# Patient Record
Sex: Male | Born: 1940
Health system: Southern US, Community
[De-identification: ages and names within clinical notes are randomized; demographics above are authoritative.]

## PROBLEM LIST (undated history)

## (undated) DIAGNOSIS — F1121 Opioid dependence, in remission: Secondary | ICD-10-CM

## (undated) DIAGNOSIS — I255 Ischemic cardiomyopathy: Secondary | ICD-10-CM

## (undated) DIAGNOSIS — I350 Nonrheumatic aortic (valve) stenosis: Secondary | ICD-10-CM

## (undated) DIAGNOSIS — I2510 Atherosclerotic heart disease of native coronary artery without angina pectoris: Secondary | ICD-10-CM

## (undated) DIAGNOSIS — N183 Chronic kidney disease, stage 3 unspecified: Secondary | ICD-10-CM

## (undated) DIAGNOSIS — D509 Iron deficiency anemia, unspecified: Secondary | ICD-10-CM

## (undated) DIAGNOSIS — Z720 Tobacco use: Secondary | ICD-10-CM

## (undated) DIAGNOSIS — H348192 Central retinal vein occlusion, unspecified eye, stable: Secondary | ICD-10-CM

## (undated) DIAGNOSIS — I5020 Unspecified systolic (congestive) heart failure: Secondary | ICD-10-CM

## (undated) DIAGNOSIS — I340 Nonrheumatic mitral (valve) insufficiency: Secondary | ICD-10-CM

## (undated) DIAGNOSIS — I10 Essential (primary) hypertension: Secondary | ICD-10-CM

## (undated) DIAGNOSIS — M1990 Unspecified osteoarthritis, unspecified site: Secondary | ICD-10-CM

## (undated) HISTORY — PX: EYE SURGERY: SHX253

## (undated) HISTORY — DX: Unspecified systolic (congestive) heart failure: I50.20

## (undated) HISTORY — DX: Central retinal vein occlusion, unspecified eye, stable: H34.8192

## (undated) HISTORY — DX: Chronic kidney disease, stage 3 unspecified: N18.30

## (undated) HISTORY — DX: Essential (primary) hypertension: I10

## (undated) HISTORY — PX: OTHER SURGICAL HISTORY: SHX169

## (undated) HISTORY — DX: Unspecified osteoarthritis, unspecified site: M19.90

## (undated) HISTORY — DX: Iron deficiency anemia, unspecified: D50.9

## (undated) HISTORY — DX: Tobacco use: Z72.0

## (undated) HISTORY — DX: Opioid dependence, in remission: F11.21

## (undated) HISTORY — DX: Nonrheumatic mitral (valve) insufficiency: I34.0

## (undated) HISTORY — DX: Chronic kidney disease, stage 3 (moderate): N18.3

---

## 1966-03-14 HISTORY — PX: OTHER SURGICAL HISTORY: SHX169

## 1992-03-14 HISTORY — PX: OTHER SURGICAL HISTORY: SHX169

## 2011-04-27 ENCOUNTER — Ambulatory Visit (INDEPENDENT_AMBULATORY_CARE_PROVIDER_SITE_OTHER): Payer: Self-pay | Admitting: Family Medicine

## 2011-04-27 ENCOUNTER — Encounter: Payer: Self-pay | Admitting: Family Medicine

## 2011-04-27 VITALS — BP 150/90 | HR 80 | Temp 98.1°F | Ht 70.5 in | Wt 158.8 lb

## 2011-04-27 DIAGNOSIS — I10 Essential (primary) hypertension: Secondary | ICD-10-CM | POA: Insufficient documentation

## 2011-04-27 MED ORDER — LISINOPRIL 10 MG PO TABS: 10.0000 mg | ORAL_TABLET | Freq: Every day | ORAL | Status: DC

## 2011-04-27 NOTE — Progress Notes (Signed)
Subjective:    Patient ID: George Washington, male    DOB: 1941-01-10, 71 y.o.   MRN: [id number]  HPI  71 yo male here to establish care.  Elevated blood pressure- has been treated for retinal vein occlusion and told BP has been slowly creeping up, no higher than 150s/90s.  No CP, SOB, or LE. He is a non smoker. Brother has h/o HTN and CAD s/p stents and CABG.  He has not had lipids or other lab work checked but currently does not have insurance.  Very active, in overall good health.  Patient Active Problem List  Diagnoses  . Elevated blood pressure   Past Medical History  Diagnosis Date  . Narcotic dependence, in remission   . Elevated blood pressure   . Arthritis   . Retinal vein occlusion    Past Surgical History  Procedure Date  . Vro   . Eye surgery    History  Substance Use Topics  . Smoking status: Not on file  . Smokeless tobacco: Not on file  . Alcohol Use: Not on file   Family History  Problem Relation Age of Onset  . Hyperlipidemia Father   . Hypertension Father   . Heart disease Sister 50    MI, CABG   No Known Allergies  Current outpatient prescriptions:aspirin-acetaminophen-caffeine (EXCEDRIN MIGRAINE) 250-250-65 MG per tablet, Take 1 tablet by mouth every 6 (six) hours as needed., Disp: , Rfl: ;  fish oil-omega-3 fatty acids 1000 MG capsule, Take 1 g by mouth daily., Disp: , Rfl: ;  meloxicam (MOBIC) 15 MG tablet, Take 15 mg by mouth 2 (two) times daily., Disp: , Rfl: ;  Multiple Vitamins-Minerals (CENTRUM SILVER PO), Take 1 tablet by mouth daily., Disp: , Rfl:  vitamin E 400 UNIT capsule, Take 400 Units by mouth daily., Disp: , Rfl: ;  lisinopril (PRINIVIL,ZESTRIL) 10 MG tablet, Take 1 tablet (10 mg total) by mouth daily., Disp: 30 tablet, Rfl: 6  The PMH, PSH, Social History, Family History, Medications, and allergies have been reviewed in [facility], and have been updated if relevant.    Review of Systems See HPI Patient reports no  vision/ hearing  changes,anorexia, weight change, fever ,adenopathy, persistant / recurrent hoarseness, swallowing issues, chest pain, edema,persistant / recurrent cough, hemoptysis, dyspnea(rest, exertional, paroxysmal nocturnal), gastrointestinal  bleeding (melena, rectal bleeding), abdominal pain, excessive heart burn, GU symptoms(dysuria, hematuria, pyuria, voiding/incontinence  Issues) syncope, focal weakness, severe memory loss, concerning skin lesions, depression, anxiety, abnormal bruising/bleeding, major joint swelling.       Objective:   Physical Exam BP 150/90  Pulse 80  Temp(Src) 98.1 F (36.7 C) (Oral)  Ht 5' 10.5" (1.791 m)  Wt 158 lb 12 oz (72.009 kg)  BMI 22.46 kg/m2 General:  overweght male in NAD Eyes:  PERRL Ears:  External ear exam shows no significant lesions or deformities.  Otoscopic examination reveals clear canals, tympanic membranes are intact bilaterally without bulging, retraction, inflammation or discharge. Hearing is grossly normal bilaterally. Nose:  External nasal examination shows no deformity or inflammation. Nasal mucosa are pink and moist without lesions or exudates. Mouth:  Oral mucosa and oropharynx without lesions or exudates.  Teeth in good repair. Neck:  no carotid bruit or thyromegaly no cervical or supraclavicular lymphadenopathy  Lungs:  Normal respiratory effort, chest expands symmetrically. Lungs are clear to auscultation, no crackles or wheezes. Heart:  Normal rate and regular rhythm. S1 and S2 normal without gallop, murmur, click, rub or other extra sounds, faint bilateral carotid bruits.  Abdomen:  Bowel sounds positive,abdomen soft and non-tender without masses, organomegaly or hernias noted. Pulses:  R and L posterior tibial pulses are full and equal bilaterally  Extremities:  no edema     Assessment & Plan:   1. Hypertension   New. Will start Lisinopril 10 mg daily today- see pt instructions for details. Would like to check labs- BMET, lipid panel and  carotid dopplers.  Pt currently refusing due to lack of insurance.  Will defer until next month.

## 2011-04-27 NOTE — Patient Instructions (Signed)
It was great to meet you, George Washington. Please call me in 2-3 weeks with your blood pressure readings. As soon as your insurance paperwork goes through, please make another appointment so we can get some labwork and other studies discussed today. Say hi to [facility] for me.

## 2011-05-10 ENCOUNTER — Telehealth: Payer: Self-pay | Admitting: Family Medicine

## 2011-05-10 MED ORDER — LISINOPRIL-HYDROCHLOROTHIAZIDE 20-12.5 MG PO TABS: 1.0000 | ORAL_TABLET | Freq: Every day | ORAL | Status: DC

## 2011-05-10 NOTE — Telephone Encounter (Signed)
George Washington, patient wife called. Says pt is having problems w/ his blood pressure.  Reading are 160/90; 170.98; 170/100. Says the medication is not helping. Wife would like a call back at  [phone].Marland Kitchen

## 2011-05-10 NOTE — Telephone Encounter (Signed)
Spoke with his wife, Dondra Spry who is an Charity fundraiser at [employer]. Pt has already been doubling his dose so he has been taking lisinopril 20 mg daily. Will add HCTZ 12.5 mg daily with combo pill. Advised to STOP current lisinopril. Dondra Spry indicates understanding of these issues and agrees with the plan.

## 2011-09-12 DIAGNOSIS — H35359 Cystoid macular degeneration, unspecified eye: Secondary | ICD-10-CM | POA: Diagnosis not present

## 2011-09-12 DIAGNOSIS — H348192 Central retinal vein occlusion, unspecified eye, stable: Secondary | ICD-10-CM | POA: Diagnosis not present

## 2011-10-25 DIAGNOSIS — H348192 Central retinal vein occlusion, unspecified eye, stable: Secondary | ICD-10-CM | POA: Diagnosis not present

## 2011-10-25 DIAGNOSIS — H35359 Cystoid macular degeneration, unspecified eye: Secondary | ICD-10-CM | POA: Diagnosis not present

## 2011-11-16 ENCOUNTER — Encounter: Payer: Self-pay | Admitting: Family Medicine

## 2011-11-16 ENCOUNTER — Encounter: Payer: Self-pay | Admitting: Gastroenterology

## 2011-11-16 ENCOUNTER — Ambulatory Visit (INDEPENDENT_AMBULATORY_CARE_PROVIDER_SITE_OTHER): Payer: Medicare Other | Admitting: Family Medicine

## 2011-11-16 ENCOUNTER — Other Ambulatory Visit: Payer: Self-pay | Admitting: Family Medicine

## 2011-11-16 VITALS — BP 142/70 | HR 74 | Temp 97.9°F | Ht 68.0 in | Wt 148.0 lb

## 2011-11-16 DIAGNOSIS — I10 Essential (primary) hypertension: Secondary | ICD-10-CM | POA: Diagnosis not present

## 2011-11-16 DIAGNOSIS — Z0000 Encounter for general adult medical examination without abnormal findings: Secondary | ICD-10-CM

## 2011-11-16 DIAGNOSIS — Z136 Encounter for screening for cardiovascular disorders: Secondary | ICD-10-CM

## 2011-11-16 DIAGNOSIS — Z125 Encounter for screening for malignant neoplasm of prostate: Secondary | ICD-10-CM | POA: Diagnosis not present

## 2011-11-16 DIAGNOSIS — Z720 Tobacco use: Secondary | ICD-10-CM

## 2011-11-16 DIAGNOSIS — Z23 Encounter for immunization: Secondary | ICD-10-CM

## 2011-11-16 DIAGNOSIS — N179 Acute kidney failure, unspecified: Secondary | ICD-10-CM

## 2011-11-16 DIAGNOSIS — Z1211 Encounter for screening for malignant neoplasm of colon: Secondary | ICD-10-CM

## 2011-11-16 LAB — COMPREHENSIVE METABOLIC PANEL
ALT: 12 U/L (ref 0–53)
CO2: 26 mEq/L (ref 19–32)
Calcium: 9.6 mg/dL (ref 8.4–10.5)
Chloride: 103 mEq/L (ref 96–112)
GFR: 43.05 mL/min — ABNORMAL LOW (ref >60.00)
Sodium: 135 mEq/L (ref 135–145)
Total Protein: 6.3 g/dL (ref 6.0–8.3)

## 2011-11-16 LAB — PSA, MEDICARE: PSA: 1.16 ng/ml (ref 0.10–4.00)

## 2011-11-16 LAB — LIPID PANEL: VLDL: 11 mg/dL (ref 0.0–40.0)

## 2011-11-16 MED ORDER — HYDROCHLOROTHIAZIDE 25 MG PO TABS: 12.5000 mg | ORAL_TABLET | Freq: Every day | ORAL | Status: DC

## 2011-11-16 MED ORDER — ZOSTER VACCINE LIVE 19400 UNT/0.65ML ~~LOC~~ SOLR: 0.6500 mL | Freq: Once | SUBCUTANEOUS | Status: AC

## 2011-11-16 NOTE — Patient Instructions (Addendum)
Great to see you, Mr. George Washington. We are STOPPING your lisinopril/HCTZ and start HCTZ 12.5 mg daily - ok to increase to a whole tablet (25 mg daily) if your blood pressure goes up. Please call me in few weeks with an update.  Check with your insurance to see if they will cover the shingles shot. Please stop by to see Shirlee Limerick on your way out to set up your colonoscopy.

## 2011-11-16 NOTE — Progress Notes (Signed)
Subjective:    Patient ID: George Washington, male    DOB: May 23, 1940, 71 y.o.   MRN: [id number]  HPI  71 yo male here for Welcome to Medicare Physical.  I have personally reviewed the Medicare Annual Wellness questionnaire and have noted 1. The patient's medical and social history 2. Their use of alcohol, tobacco or illicit drugs 3. Their current medications and supplements 4. The patient's functional ability including ADL's, fall risks, home safety risks and hearing or visual             impairment. 5. Diet and physical activities 6. Evidence for depression or mood disorders  HTN- has been treated for retinal vein occlusion and told BP has been slowly creeping up, no higher than 150s/90s.  No CP, SOB, or LE. He is a non smoker. Brother has h/o HTN and CAD s/p stents and CABG. We started Lisinopril 10 mg/ HCTZ 12.5 mg daily at his initial office visit in 04/2011- lost to follow up.  He did not have insurance and was waiting until he started medicare.  Very active, in overall good health.  Has lost 10 pounds since February- exercising more.  Has noted a dry cough when questioned.  No SOB.  Has not had a colonoscopy in years. He is a former/sometimes current smoker- never screened for AAA.    Patient Active Problem List  Diagnosis  . Hypertension  . Routine general medical examination at a health care facility   Past Medical History  Diagnosis Date  . Narcotic dependence, in remission   . Elevated blood pressure   . Arthritis   . Retinal vein occlusion    Past Surgical History  Procedure Date  . Vro   . Eye surgery    History  Substance Use Topics  . Smoking status: Not on file  . Smokeless tobacco: Not on file  . Alcohol Use: Not on file   Family History  Problem Relation Age of Onset  . Hyperlipidemia Father   . Hypertension Father   . Heart disease Sister 56    MI, CABG   No Known Allergies  Current outpatient prescriptions:aspirin-acetaminophen-caffeine  (EXCEDRIN MIGRAINE) 250-250-65 MG per tablet, Take 1 tablet by mouth every 6 (six) hours as needed., Disp: , Rfl: ;  fish oil-omega-3 fatty acids 1000 MG capsule, Take 1 g by mouth daily., Disp: , Rfl: ;  lisinopril (PRINIVIL,ZESTRIL) 10 MG tablet, Take 1 tablet (10 mg total) by mouth daily., Disp: 30 tablet, Rfl: 6 lisinopril-hydrochlorothiazide (PRINZIDE,ZESTORETIC) 20-12.5 MG per tablet, Take 1 tablet by mouth daily., Disp: 30 tablet, Rfl: 1;  meloxicam (MOBIC) 15 MG tablet, Take 15 mg by mouth 2 (two) times daily., Disp: , Rfl: ;  Multiple Vitamins-Minerals (CENTRUM SILVER PO), Take 1 tablet by mouth daily., Disp: , Rfl: ;  vitamin E 400 UNIT capsule, Take 400 Units by mouth daily., Disp: , Rfl:   The PMH, PSH, Social History, Family History, Medications, and allergies have been reviewed in [facility], and have been updated if relevant.    Review of Systems See HPI Patient reports no  vision/ hearing changes,anorexia, weight change, fever ,adenopathy, persistant / recurrent hoarseness, swallowing issues, chest pain, edema,persistant / recurrent cough, hemoptysis, dyspnea(rest, exertional, paroxysmal nocturnal), gastrointestinal  bleeding (melena, rectal bleeding), abdominal pain, excessive heart burn, GU symptoms(dysuria, hematuria, pyuria, voiding/incontinence  Issues) syncope, focal weakness, severe memory loss, concerning skin lesions, depression, anxiety, abnormal bruising/bleeding, major joint swelling.       Objective:   Physical Exam BP 142/70  Pulse 74  Temp 97.9 F (36.6 C)  Ht 5\' 8"  (1.727 m)  Wt 148 lb (67.132 kg)  BMI 22.50 kg/m2 Wt Readings from Last 3 Encounters:  11/16/11 148 lb (67.132 kg)  04/27/11 158 lb 12 oz (72.009 kg)     General:  pleasant male in NAD Eyes:  PERRL Ears:  External ear exam shows no significant lesions or deformities.  Otoscopic examination reveals clear canals, tympanic membranes are intact bilaterally without bulging, retraction, inflammation or  discharge. Hearing is grossly normal bilaterally. Nose:  External nasal examination shows no deformity or inflammation. Nasal mucosa are pink and moist without lesions or exudates. Mouth:  Oral mucosa and oropharynx without lesions or exudates.  Teeth in good repair. Neck:  no carotid bruit or thyromegaly no cervical or supraclavicular lymphadenopathy  Lungs:  Normal respiratory effort, chest expands symmetrically. Lungs are clear to auscultation, no crackles or wheezes. Heart:  Normal rate and regular rhythm. S1 and S2 normal without gallop, murmur, click, rub or other extra sounds, faint bilateral carotid bruits. Abdomen:  Bowel sounds positive,abdomen soft and non-tender without masses, organomegaly or hernias noted. Pulses:  R and L posterior tibial pulses are full and equal bilaterally  Extremities:  no edema     Assessment & Plan:   1. Hypertension  Stable but has a dry cough- probable ACEI allergy- added to allergy list. D/c lisinopril. Start HCTZ 12.5 mg daily only (has lost 10 pounds and likely does not need as high of a dose). The patient indicates understanding of these issues and agrees with the plan.   2. Routine general medical examination at a health care facility  The patients weight, height, BMI and visual acuity have been recorded in the chart I have made referrals, counseling and provided education to the patient based review of the above and I have provided the pt with a written personalized care plan for preventive services. Orders Placed This Encounter  Procedures  . Comprehensive metabolic panel  . Lipid Panel  . PSA, Medicare  . Ambulatory referral to Gastroenterology  . EKG 12-Lead  . Abdominal Aortic Aneurysm duplex   Given Rx for zostavax. Pneumovax given today.

## 2011-11-16 NOTE — Addendum Note (Signed)
Addended by: Eliezer Bottom on: 11/16/2011 03:20 PM   Modules accepted: Orders

## 2011-11-18 ENCOUNTER — Telehealth: Payer: Self-pay

## 2011-11-18 MED ORDER — HYDROCHLOROTHIAZIDE 12.5 MG PO CAPS: 12.5000 mg | ORAL_CAPSULE | Freq: Every day | ORAL | Status: DC

## 2011-11-18 NOTE — Telephone Encounter (Signed)
Pt request HCTZ capsule 12.5 mg sent to [facility]; does not want to cut 25 mg pill in half.Please advise.

## 2011-11-18 NOTE — Telephone Encounter (Signed)
Ok to send as requested

## 2011-11-24 ENCOUNTER — Other Ambulatory Visit (INDEPENDENT_AMBULATORY_CARE_PROVIDER_SITE_OTHER): Payer: Medicare Other

## 2011-11-24 DIAGNOSIS — N179 Acute kidney failure, unspecified: Secondary | ICD-10-CM | POA: Diagnosis not present

## 2011-11-24 LAB — RENAL FUNCTION PANEL
Albumin: 3.8 g/dL (ref 3.5–5.2)
CO2: 24 mEq/L (ref 19–32)
Calcium: 9.4 mg/dL (ref 8.4–10.5)
Chloride: 104 mEq/L (ref 96–112)
Potassium: 4.4 mEq/L (ref 3.5–5.1)
Sodium: 137 mEq/L (ref 135–145)

## 2011-11-25 ENCOUNTER — Other Ambulatory Visit: Payer: Self-pay | Admitting: Family Medicine

## 2011-11-25 DIAGNOSIS — N179 Acute kidney failure, unspecified: Secondary | ICD-10-CM

## 2011-11-29 ENCOUNTER — Encounter (INDEPENDENT_AMBULATORY_CARE_PROVIDER_SITE_OTHER): Payer: Medicare Other

## 2011-11-29 DIAGNOSIS — Z0000 Encounter for general adult medical examination without abnormal findings: Secondary | ICD-10-CM | POA: Diagnosis not present

## 2011-11-29 DIAGNOSIS — Z720 Tobacco use: Secondary | ICD-10-CM

## 2011-11-29 DIAGNOSIS — Z136 Encounter for screening for cardiovascular disorders: Secondary | ICD-10-CM

## 2011-11-29 DIAGNOSIS — Z23 Encounter for immunization: Secondary | ICD-10-CM | POA: Diagnosis not present

## 2011-12-05 ENCOUNTER — Other Ambulatory Visit: Payer: Self-pay | Admitting: Family Medicine

## 2011-12-05 DIAGNOSIS — I77819 Aortic ectasia, unspecified site: Secondary | ICD-10-CM

## 2011-12-06 ENCOUNTER — Telehealth: Payer: Self-pay | Admitting: Family Medicine

## 2011-12-06 NOTE — Telephone Encounter (Signed)
[facility] has placed a recall for FU Abdominal Aortic Aneurysm Duplex in 2 years. Letter will be sent to the patient to call  to schedule this study at [facility] in [place]. Patient has been notified.

## 2011-12-20 DIAGNOSIS — H35359 Cystoid macular degeneration, unspecified eye: Secondary | ICD-10-CM | POA: Diagnosis not present

## 2011-12-20 DIAGNOSIS — H348192 Central retinal vein occlusion, unspecified eye, stable: Secondary | ICD-10-CM | POA: Diagnosis not present

## 2011-12-23 ENCOUNTER — Other Ambulatory Visit (INDEPENDENT_AMBULATORY_CARE_PROVIDER_SITE_OTHER): Payer: Medicare Other

## 2011-12-23 DIAGNOSIS — N179 Acute kidney failure, unspecified: Secondary | ICD-10-CM

## 2011-12-23 LAB — BASIC METABOLIC PANEL
BUN: 22 mg/dL (ref 6–23)
Chloride: 103 mEq/L (ref 96–112)
Glucose, Bld: 99 mg/dL (ref 70–99)
Potassium: 4.6 mEq/L (ref 3.5–5.1)
Sodium: 138 mEq/L (ref 135–145)

## 2011-12-26 ENCOUNTER — Ambulatory Visit (AMBULATORY_SURGERY_CENTER): Payer: Medicare Other | Admitting: *Deleted

## 2011-12-26 ENCOUNTER — Other Ambulatory Visit: Payer: Self-pay | Admitting: Family Medicine

## 2011-12-26 VITALS — Ht 70.0 in | Wt 156.8 lb

## 2011-12-26 DIAGNOSIS — N289 Disorder of kidney and ureter, unspecified: Secondary | ICD-10-CM

## 2011-12-26 DIAGNOSIS — Z1211 Encounter for screening for malignant neoplasm of colon: Secondary | ICD-10-CM

## 2011-12-26 MED ORDER — MOVIPREP 100 G PO SOLR: ORAL | Status: DC

## 2012-01-09 ENCOUNTER — Ambulatory Visit (AMBULATORY_SURGERY_CENTER): Payer: Medicare Other | Admitting: Gastroenterology

## 2012-01-09 ENCOUNTER — Encounter: Payer: Self-pay | Admitting: Gastroenterology

## 2012-01-09 VITALS — BP 126/66 | HR 71 | Temp 96.6°F | Resp 16 | Ht 70.0 in | Wt 156.0 lb

## 2012-01-09 DIAGNOSIS — Z1211 Encounter for screening for malignant neoplasm of colon: Secondary | ICD-10-CM

## 2012-01-09 MED ORDER — SODIUM CHLORIDE 0.9 % IV SOLN: 500.0000 mL | INTRAVENOUS | Status: DC

## 2012-01-09 NOTE — Progress Notes (Signed)
Patient did not experience any of the following events: a burn prior to discharge; a fall within the facility; wrong site/side/patient/procedure/implant event; or a hospital transfer or hospital admission upon discharge from the facility. (G8907) Patient did not have preoperative order for IV antibiotic SSI prophylaxis. (G8918)  

## 2012-01-09 NOTE — Op Note (Signed)
 [facility] [address] [place], [address]   COLONOSCOPY PROCEDURE REPORT  PATIENT: George Washington, George Washington  MR#: [id number] BIRTHDATE: Mar 21, 1940 , 70  yrs. old GENDER: Male ENDOSCOPIST: Rachael Fee, MD REFERRED HQ:IONGE Aron, M.D. PROCEDURE DATE:  01/09/2012 PROCEDURE:   Colonoscopy, diagnostic ASA CLASS:   Class III INDICATIONS:average risk screening. MEDICATIONS: Fentanyl 100 mcg IV, Versed 10 mg IV, and These medications were titrated to patient response per physician's verbal order  DESCRIPTION OF PROCEDURE:   After the risks benefits and alternatives of the procedure were thoroughly explained, informed consent was obtained.  A digital rectal exam revealed no abnormalities of the rectum.   The LB [id number]  endoscope was introduced through the anus and advanced to the cecum, which was identified by both the appendix and ileocecal valve. No adverse events experienced.   The quality of the prep was good, using MoviPrep  The instrument was then slowly withdrawn as the colon was fully examined.    COLON FINDINGS: A normal appearing cecum, ileocecal valve, and appendiceal orifice were identified.  The ascending, hepatic flexure, transverse, splenic flexure, descending, sigmoid colon and rectum appeared unremarkable.  No polyps or cancers were seen. Retroflexed views revealed no abnormalities. The time to cecum=5 minutes 10 seconds.  Withdrawal time=10 minutes 18 seconds.  The scope was withdrawn and the procedure completed. COMPLICATIONS: There were no complications.  ENDOSCOPIC IMPRESSION: Normal colon No cancers or polyps  RECOMMENDATIONS: You should continue to follow colorectal cancer screening guidelines for "routine risk" patients with a repeat colonoscopy in 10 years. There is no need for FOBT (stool) testing for at least 5 years.   eSigned:  Rachael Fee, MD 01/09/2012 9:21 AM

## 2012-01-09 NOTE — Patient Instructions (Addendum)
One of your biggest health concerns is your smoking.  This increases your risk for most cancers and serious cardiovascular diseases such as strokes, heart attacks.  You should try your best to stop.  If you need assistance, please contact your PCP or Smoking Cessation Class at [facility] ([phone]) or [place] [facility] (1-800-QUIT-NOW).  Discharge instructions given with verbal understanding. Normal exam. Resume previous medications. YOU HAD AN ENDOSCOPIC PROCEDURE TODAY AT THE [facility]: Refer to the procedure report that was given to you for any specific questions about what was found during the examination.  If the procedure report does not answer your questions, please call your gastroenterologist to clarify.  If you requested that your care partner not be given the details of your procedure findings, then the procedure report has been included in a sealed envelope for you to review at your convenience later.  YOU SHOULD EXPECT: Some feelings of bloating in the abdomen. Passage of more gas than usual.  Walking can help get rid of the air that was put into your GI tract during the procedure and reduce the bloating. If you had a lower endoscopy (such as a colonoscopy or flexible sigmoidoscopy) you may notice spotting of blood in your stool or on the toilet paper. If you underwent a bowel prep for your procedure, then you may not have a normal bowel movement for a few days.  DIET: Your first meal following the procedure should be a light meal and then it is ok to progress to your normal diet.  A half-sandwich or bowl of soup is an example of a good first meal.  Heavy or fried foods are harder to digest and may make you feel nauseous or bloated.  Likewise meals heavy in dairy and vegetables can cause extra gas to form and this can also increase the bloating.  Drink plenty of fluids but you should avoid alcoholic beverages for 24 hours.  ACTIVITY: Your care partner should take you  home directly after the procedure.  You should plan to take it easy, moving slowly for the rest of the day.  You can resume normal activity the day after the procedure however you should NOT DRIVE or use heavy machinery for 24 hours (because of the sedation medicines used during the test).    SYMPTOMS TO REPORT IMMEDIATELY: A gastroenterologist can be reached at any hour.  During normal business hours, 8:30 AM to 5:00 PM Monday through Friday, call [phone].  After hours and on weekends, please call the GI answering service at [phone] who will take a message and have the physician on call contact you.   Following lower endoscopy (colonoscopy or flexible sigmoidoscopy):  Excessive amounts of blood in the stool  Significant tenderness or worsening of abdominal pains  Swelling of the abdomen that is new, acute  Fever of 100F or higher  FOLLOW UP: If any biopsies were taken you will be contacted by phone or by letter within the next 1-3 weeks.  Call your gastroenterologist if you have not heard about the biopsies in 3 weeks.  Our staff will call the home number listed on your records the next business day following your procedure to check on you and address any questions or concerns that you may have at that time regarding the information given to you following your procedure. This is a courtesy call and so if there is no answer at the home number and we have not heard from you through the emergency   physician on call, we will assume that you have returned to your regular daily activities without incident.  SIGNATURES/CONFIDENTIALITY: You and/or your care partner have signed paperwork which will be entered into your electronic medical record.  These signatures attest to the fact that that the information above on your After Visit Summary has been reviewed and is understood.  Full responsibility of the confidentiality of this discharge information lies with you and/or your care-partner. 

## 2012-01-10 ENCOUNTER — Telehealth: Payer: Self-pay | Admitting: *Deleted

## 2012-01-10 NOTE — Telephone Encounter (Signed)
No answer, left message to call office for questions or concerns. 

## 2012-01-19 DIAGNOSIS — I10 Essential (primary) hypertension: Secondary | ICD-10-CM | POA: Diagnosis not present

## 2012-01-19 DIAGNOSIS — R809 Proteinuria, unspecified: Secondary | ICD-10-CM | POA: Diagnosis not present

## 2012-01-19 DIAGNOSIS — N2581 Secondary hyperparathyroidism of renal origin: Secondary | ICD-10-CM | POA: Diagnosis not present

## 2012-01-19 DIAGNOSIS — R609 Edema, unspecified: Secondary | ICD-10-CM | POA: Diagnosis not present

## 2012-02-20 DIAGNOSIS — R809 Proteinuria, unspecified: Secondary | ICD-10-CM | POA: Diagnosis not present

## 2012-02-20 DIAGNOSIS — I10 Essential (primary) hypertension: Secondary | ICD-10-CM | POA: Diagnosis not present

## 2012-02-20 DIAGNOSIS — N289 Disorder of kidney and ureter, unspecified: Secondary | ICD-10-CM | POA: Diagnosis not present

## 2012-02-27 ENCOUNTER — Ambulatory Visit: Payer: Self-pay | Admitting: Nephrology

## 2012-02-27 DIAGNOSIS — I714 Abdominal aortic aneurysm, without rupture: Secondary | ICD-10-CM | POA: Diagnosis not present

## 2012-02-27 DIAGNOSIS — N289 Disorder of kidney and ureter, unspecified: Secondary | ICD-10-CM | POA: Diagnosis not present

## 2012-02-28 DIAGNOSIS — H348192 Central retinal vein occlusion, unspecified eye, stable: Secondary | ICD-10-CM | POA: Diagnosis not present

## 2012-02-28 DIAGNOSIS — H35359 Cystoid macular degeneration, unspecified eye: Secondary | ICD-10-CM | POA: Diagnosis not present

## 2012-03-26 DIAGNOSIS — I10 Essential (primary) hypertension: Secondary | ICD-10-CM | POA: Diagnosis not present

## 2012-03-26 DIAGNOSIS — R609 Edema, unspecified: Secondary | ICD-10-CM | POA: Diagnosis not present

## 2012-03-26 DIAGNOSIS — R809 Proteinuria, unspecified: Secondary | ICD-10-CM | POA: Diagnosis not present

## 2012-04-16 DIAGNOSIS — I714 Abdominal aortic aneurysm, without rupture: Secondary | ICD-10-CM | POA: Diagnosis not present

## 2012-07-09 ENCOUNTER — Ambulatory Visit (INDEPENDENT_AMBULATORY_CARE_PROVIDER_SITE_OTHER): Payer: Medicare Other | Admitting: Family Medicine

## 2012-07-09 ENCOUNTER — Encounter: Payer: Self-pay | Admitting: Family Medicine

## 2012-07-09 VITALS — BP 140/90 | HR 92 | Temp 97.9°F | Wt 167.0 lb

## 2012-07-09 DIAGNOSIS — I10 Essential (primary) hypertension: Secondary | ICD-10-CM

## 2012-07-09 DIAGNOSIS — N189 Chronic kidney disease, unspecified: Secondary | ICD-10-CM

## 2012-07-09 DIAGNOSIS — J441 Chronic obstructive pulmonary disease with (acute) exacerbation: Secondary | ICD-10-CM | POA: Insufficient documentation

## 2012-07-09 DIAGNOSIS — N179 Acute kidney failure, unspecified: Secondary | ICD-10-CM

## 2012-07-09 LAB — RENAL FUNCTION PANEL
CO2: 26 mEq/L (ref 19–32)
Creatinine, Ser: 1.7 mg/dL — ABNORMAL HIGH (ref 0.4–1.5)
GFR: 42.67 mL/min — ABNORMAL LOW (ref >60.00)
Glucose, Bld: 94 mg/dL (ref 70–99)
Phosphorus: 3.8 mg/dL (ref 2.3–4.6)
Sodium: 136 mEq/L (ref 135–145)

## 2012-07-09 MED ORDER — LOSARTAN POTASSIUM 25 MG PO TABS: 25.0000 mg | ORAL_TABLET | Freq: Every day | ORAL | Status: DC

## 2012-07-09 NOTE — Progress Notes (Signed)
Subjective:    Patient ID: George Washington, male    DOB: [birth year], 72 y.o.   MRN: [id number]  HPI   72 yo with h/o HTN, CKD II, RVO, here for follow up.  HTN- On lostartan 25 mg and HCTZ 12.5 mg daily.  BP at home been running 1203-130s/70s.  Rushed to get her today and had a cigarette prior to coming in. No CP, SOB or blurred vision.  ?COPD- brother in law told him he had COPD.  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  He does not think he had formal PFTs.  CKD- last saw renal in January, renal U/S performed.  Note reviewed.  Patient Active Problem List  Diagnosis  . Hypertension  . Chronic kidney disease   Past Medical History  Diagnosis Date  . Narcotic dependence, in remission   . Arthritis   . Retinal vein occlusion    Past Surgical History  Procedure Laterality Date  . Vro    . Eye surgery    . Cyst chest  1994    left  . Rectal fissure  1968   History  Substance Use Topics  . Smoking status: Current Every Day Smoker -- 0.50 packs/day  . Smokeless tobacco: Never Used  . Alcohol Use: No   Family History  Problem Relation Age of Onset  . Hyperlipidemia Father   . Hypertension Father   . Heart disease Sister 28    MI, CABG  . Colon cancer Neg Hx   . Stomach cancer Neg Hx    Allergies  Allergen Reactions  . Lisinopril     cough    Current outpatient prescriptions:aspirin-acetaminophen-caffeine (EXCEDRIN MIGRAINE) 250-250-65 MG per tablet, Take 1 tablet by mouth every 6 (six) hours as needed., Disp: , Rfl: ;  fish oil-omega-3 fatty acids 1000 MG capsule, Take 1 g by mouth daily., Disp: , Rfl: ;  hydrochlorothiazide (MICROZIDE) 12.5 MG capsule, Take 1 capsule (12.5 mg total) by mouth daily., Disp: 30 capsule, Rfl: 11 Multiple Vitamins-Minerals (CENTRUM SILVER PO), Take 1 tablet by mouth daily., Disp: , Rfl: ;  vitamin E 400 UNIT capsule, Take 400 Units by mouth daily., Disp: , Rfl:   The PMH, PSH, Social History, Family History, Medications,  and allergies have been reviewed in [facility], and have been updated if relevant.    Review of Systems See HPI Patient reports no  vision/ hearing changes,anorexia, weight change, fever ,adenopathy, persistant / recurrent hoarseness, swallowing issues, chest pain, edema,persistant / recurrent cough, hemoptysis, dyspnea(rest, exertional, paroxysmal nocturnal), gastrointestinal  bleeding (melena, rectal bleeding), abdominal pain, excessive heart burn, GU symptoms(dysuria, hematuria, pyuria, voiding/incontinence  Issues) syncope, focal weakness, severe memory loss, concerning skin lesions, depression, anxiety, abnormal bruising/bleeding, major joint swelling.       Objective:   Physical Exam There were no vitals taken for this visit. Wt Readings from Last 3 Encounters:  01/09/12 156 lb (70.761 kg)  12/26/11 156 lb 12.8 oz (71.124 kg)  11/16/11 148 lb (67.132 kg)     General:  pleasant male in NAD Eyes:  PERRL Ears:  External ear exam shows no significant lesions or deformities.  Otoscopic examination reveals clear canals, tympanic membranes are intact bilaterally without bulging, retraction, inflammation or discharge. Hearing is grossly normal bilaterally. Nose:  External nasal examination shows no deformity or inflammation. Nasal mucosa are pink and moist without lesions or exudates. Mouth:  Oral mucosa and oropharynx without lesions or exudates.  Teeth in good repair. Neck:  no  carotid bruit or thyromegaly no cervical or supraclavicular lymphadenopathy  Lungs:  Normal respiratory effort, chest expands symmetrically. Lungs are clear to auscultation, no crackles or wheezes. Heart:  Normal rate and regular rhythm. S1 and S2 normal without gallop, murmur, click, rub or other extra sounds, faint bilateral carotid bruits. Abdomen:  Bowel sounds positive,abdomen soft and non-tender without masses, organomegaly or hernias noted. Pulses:  R and L posterior tibial pulses are full and equal bilaterally   Extremities:  no edema     Assessment & Plan:   1. Hypertension Stable on current meds.  - Renal Function Panel   2. Chronic kidney disease Recheck renal function panel today. - Renal Function Panel  3. COPD  New diagnosis.  Discussed having PFTs done at next OV. The patient indicates understanding of these issues and agrees with the plan.

## 2012-07-09 NOTE — Patient Instructions (Addendum)
Good to see you. I am so sorry to hear about your brother. We will call you with your lab results.

## 2012-08-07 DIAGNOSIS — H04129 Dry eye syndrome of unspecified lacrimal gland: Secondary | ICD-10-CM | POA: Diagnosis not present

## 2012-08-07 DIAGNOSIS — H348192 Central retinal vein occlusion, unspecified eye, stable: Secondary | ICD-10-CM | POA: Diagnosis not present

## 2012-08-07 DIAGNOSIS — H35359 Cystoid macular degeneration, unspecified eye: Secondary | ICD-10-CM | POA: Diagnosis not present

## 2012-08-15 ENCOUNTER — Telehealth: Payer: Self-pay

## 2012-08-15 NOTE — Telephone Encounter (Signed)
Pt said had spoken with Dr Dayton Martes before about prescribing albuterol inhaler use 2 puffs four times a day and Symbicort 164.5 and one puff twice day to [place] on [address]. Pt has been taking samples a friend gave pt, not on pt's med list; pt has COPD and albuterol and symbicort helps pt's breathing.Pt request call back.

## 2012-08-15 NOTE — Telephone Encounter (Signed)
Yes ok to refill both  

## 2012-08-17 MED ORDER — ALBUTEROL SULFATE HFA 108 (90 BASE) MCG/ACT IN AERS: INHALATION_SPRAY | RESPIRATORY_TRACT | Status: DC

## 2012-08-17 MED ORDER — BUDESONIDE-FORMOTEROL FUMARATE 160-4.5 MCG/ACT IN AERO: 1.0000 | INHALATION_SPRAY | Freq: Two times a day (BID) | RESPIRATORY_TRACT | Status: DC

## 2012-08-17 NOTE — Telephone Encounter (Signed)
Both meds sent to pharmacy, left message on voice mail advising patient.

## 2012-08-20 DIAGNOSIS — I10 Essential (primary) hypertension: Secondary | ICD-10-CM | POA: Diagnosis not present

## 2012-08-20 DIAGNOSIS — R809 Proteinuria, unspecified: Secondary | ICD-10-CM | POA: Diagnosis not present

## 2012-08-20 DIAGNOSIS — N039 Chronic nephritic syndrome with unspecified morphologic changes: Secondary | ICD-10-CM | POA: Diagnosis not present

## 2012-08-20 DIAGNOSIS — N2581 Secondary hyperparathyroidism of renal origin: Secondary | ICD-10-CM | POA: Diagnosis not present

## 2012-09-05 DIAGNOSIS — I10 Essential (primary) hypertension: Secondary | ICD-10-CM | POA: Diagnosis not present

## 2012-09-05 DIAGNOSIS — R809 Proteinuria, unspecified: Secondary | ICD-10-CM | POA: Diagnosis not present

## 2012-09-05 DIAGNOSIS — N2581 Secondary hyperparathyroidism of renal origin: Secondary | ICD-10-CM | POA: Diagnosis not present

## 2012-09-05 DIAGNOSIS — N039 Chronic nephritic syndrome with unspecified morphologic changes: Secondary | ICD-10-CM | POA: Diagnosis not present

## 2012-09-07 ENCOUNTER — Ambulatory Visit (INDEPENDENT_AMBULATORY_CARE_PROVIDER_SITE_OTHER)
Admission: RE | Admit: 2012-09-07 | Discharge: 2012-09-07 | Disposition: A | Discharge status: Home / Self Care | Payer: Medicare Other | Source: Ambulatory Visit | Attending: Family Medicine | Admitting: Family Medicine

## 2012-09-07 ENCOUNTER — Other Ambulatory Visit: Payer: Self-pay | Admitting: Family Medicine

## 2012-09-07 DIAGNOSIS — R05 Cough: Secondary | ICD-10-CM

## 2012-09-07 DIAGNOSIS — D509 Iron deficiency anemia, unspecified: Secondary | ICD-10-CM

## 2012-09-07 NOTE — Progress Notes (Signed)
Please call pt to let him know that I would like for him to come in for a chest xray today ( order placed).  Please call his cell phone.

## 2012-09-11 ENCOUNTER — Ambulatory Visit (INDEPENDENT_AMBULATORY_CARE_PROVIDER_SITE_OTHER): Payer: Medicare Other | Admitting: Family Medicine

## 2012-09-11 ENCOUNTER — Encounter: Payer: Self-pay | Admitting: Family Medicine

## 2012-09-11 ENCOUNTER — Telehealth: Payer: Self-pay | Admitting: Gastroenterology

## 2012-09-11 VITALS — BP 140/78 | HR 84 | Temp 97.9°F | Wt 169.0 lb

## 2012-09-11 DIAGNOSIS — D509 Iron deficiency anemia, unspecified: Secondary | ICD-10-CM | POA: Diagnosis not present

## 2012-09-11 LAB — CBC WITH DIFFERENTIAL/PLATELET
Basophils Relative: 0.1 % (ref 0.0–3.0)
Eosinophils Relative: 4.6 % (ref 0.0–5.0)
Hemoglobin: 8.6 g/dL — ABNORMAL LOW (ref 13.0–17.0)
Lymphocytes Relative: 19.6 % (ref 12.0–46.0)
Monocytes Relative: 9.7 % (ref 3.0–12.0)
Neutro Abs: 4.1 10*3/uL (ref 1.4–7.7)
RBC: 3.9 Mil/uL — ABNORMAL LOW (ref 4.22–5.81)
WBC: 6.2 10*3/uL (ref 4.5–10.5)

## 2012-09-11 LAB — FOLATE: Folate: >24.8 ng/mL (ref >5.9)

## 2012-09-11 NOTE — Addendum Note (Signed)
Addended by: Alvina Chou on: 09/11/2012 09:02 AM   Modules accepted: Orders

## 2012-09-11 NOTE — Progress Notes (Signed)
Subjective:    Patient ID: George Washington, male    DOB: 24-Apr-1940, 72 y.o.   MRN: [id number]  HPI  72 yo here to discuss new diagnosis of iron deficiency anemia.  Dr. Wynelle Link called me.  Hemoglobin low at 8.1.   MCV 72, iron 13, iron sat 3, ferritin 8, TIBC 372.  Unsure what last CBC was in past but has given blood every year at his [facility] for 30 years.  Colonoscopy UTD- 12/2011.  Pt denies any blood in stool, no dark stools.  Home occult blood test neg.  Has had some DOE and fatigue with exertion over past several months.  Does not take any NSAIDs.  Started taking PPI, B12  and iron last week once he found out about this (he is a retired Teacher, early years/pre).  Not noticing as much SOB after starting them.  I have referred him to GI for endoscopy-   Recent CXR neg for acute process.  Dg Chest 2 View  09/07/2012   *RADIOLOGY REPORT*  Clinical Data: Cough  CHEST - 2 VIEW  Comparison: None.  Findings: Cardiomediastinal silhouette appears normal. Mild hyperexpansion of the lungs is noted.  Bony thorax is intact.  No pleural effusion or pneumothorax is noted.  Minimal linear densities are noted in both lung bases most consistent with scarring or subsegmental atelectasis.  IMPRESSION: Minimal bilateral basilar densities are noted consistent with scarring or subsegmental atelectasis.  Mild hyperexpansion of the lungs is noted.   Original Report Authenticated By: Lupita Raider.,  M.D.   Lab Results  Component Value Date   PSA 1.16 11/16/2011   He has been receiving intra occular avastin through [facility] for past year. Patient Active Problem List   Diagnosis Date Noted  . Anemia, iron deficiency 09/11/2012  . Chronic kidney disease 07/09/2012  . COPD exacerbation 07/09/2012  . Hypertension 04/27/2011   Past Medical History  Diagnosis Date  . Narcotic dependence, in remission   . Arthritis   . Retinal vein occlusion    Past Surgical History  Procedure Laterality Date  . Vro    . Eye surgery     . Cyst chest  1994    left  . Rectal fissure  1968   History  Substance Use Topics  . Smoking status: Current Every Day Smoker -- 0.50 packs/day  . Smokeless tobacco: Never Used  . Alcohol Use: No   Family History  Problem Relation Age of Onset  . Hyperlipidemia Father   . Hypertension Father   . Heart disease Sister 59    MI, CABG  . Colon cancer Neg Hx   . Stomach cancer Neg Hx    Allergies  Allergen Reactions  . Lisinopril     cough   Current Outpatient Prescriptions on File Prior to Visit  Medication Sig Dispense Refill  . albuterol (PROVENTIL HFA;VENTOLIN HFA) 108 (90 BASE) MCG/ACT inhaler Inhale 2 puffs four times a day  1 Inhaler  5  . aspirin-acetaminophen-caffeine (EXCEDRIN MIGRAINE) 250-250-65 MG per tablet Take 1 tablet by mouth every 6 (six) hours as needed.      . budesonide-formoterol (SYMBICORT) 160-4.5 MCG/ACT inhaler Inhale 1 puff into the lungs 2 (two) times daily.  1 Inhaler  5  . fish oil-omega-3 fatty acids 1000 MG capsule Take 1 g by mouth daily.      . hydrochlorothiazide (MICROZIDE) 12.5 MG capsule Take 1 capsule (12.5 mg total) by mouth daily.  30 capsule  11  . losartan (COZAAR) 25 MG tablet  Take 1 tablet (25 mg total) by mouth daily.  30 tablet    . Multiple Vitamins-Minerals (CENTRUM SILVER PO) Take 1 tablet by mouth daily.      . vitamin E 400 UNIT capsule Take 400 Units by mouth daily.       No current facility-administered medications on file prior to visit.   The PMH, PSH, Social History, Family History, Medications, and allergies have been reviewed in [facility], and have been updated if relevant.  Review of Systems See HPI    Objective:   Physical Exam BP 140/78  Pulse 84  Temp(Src) 97.9 F (36.6 C)  Wt 169 lb (76.658 kg)  BMI 24.25 kg/m2 General:  pleaseant male in NAD Eyes:  PERRL Ears:  External ear exam shows no significant lesions or deformities.  Otoscopic examination reveals clear canals, tympanic membranes are intact bilaterally  without bulging, retraction, inflammation or discharge. Hearing is grossly normal bilaterally. Nose:  External nasal examination shows no deformity or inflammation. Nasal mucosa are pink and moist without lesions or exudates. Mouth:  Oral mucosa and oropharynx without lesions or exudates.  Teeth in good repair. Neck:  no carotid bruit or thyromegaly no cervical or supraclavicular lymphadenopathy  Lungs:  Normal respiratory effort, chest expands symmetrically. Lungs are clear to auscultation, no crackles or wheezes. Heart:  Normal rate and regular rhythm. S1 and S2 normal without gallop, murmur, click, rub or other extra sounds. Pulses:  R and L posterior tibial pulses are full and equal bilaterally  Extremities:  no edema     Assessment & Plan:  1. Anemia, iron deficiency  New with unclear etiology. I do still think endoscopy is a good idea although does not have any red flag symptoms, hemocult neg and not taking NSAIDs. ?possibility of celiac. ? avastin playing a roll- will touch base with opthamologist. Check b12/folate today. Refer to hematology. Continue iron.

## 2012-09-11 NOTE — Telephone Encounter (Signed)
Pt has been scheduled to see Mike Gip on 09/12/12 930 am for low hgb sch with Shirlee Limerick

## 2012-09-11 NOTE — Patient Instructions (Addendum)
We will call you with your lab results.  Please stop by to see Shirlee Limerick on your way out to set up your referrals.  Our fax number is [phone], my cell is [phone].

## 2012-09-11 NOTE — Telephone Encounter (Signed)
Left message on machine to call back  

## 2012-09-12 ENCOUNTER — Ambulatory Visit: Payer: Medicare Other | Admitting: Physician Assistant

## 2012-09-12 ENCOUNTER — Encounter: Payer: Self-pay | Admitting: Family Medicine

## 2012-09-13 ENCOUNTER — Encounter: Payer: Self-pay | Admitting: Family Medicine

## 2012-09-14 ENCOUNTER — Encounter: Payer: Self-pay | Admitting: Family Medicine

## 2012-09-17 ENCOUNTER — Encounter: Payer: Self-pay | Admitting: Family Medicine

## 2012-09-17 MED ORDER — HYDROCHLOROTHIAZIDE 12.5 MG PO CAPS: 12.5000 mg | ORAL_CAPSULE | Freq: Every day | ORAL | Status: DC

## 2012-09-18 ENCOUNTER — Ambulatory Visit: Payer: Self-pay | Admitting: Internal Medicine

## 2012-09-18 ENCOUNTER — Encounter: Payer: Self-pay | Admitting: Nephrology

## 2012-09-18 DIAGNOSIS — Z79899 Other long term (current) drug therapy: Secondary | ICD-10-CM | POA: Diagnosis not present

## 2012-09-18 DIAGNOSIS — F17200 Nicotine dependence, unspecified, uncomplicated: Secondary | ICD-10-CM | POA: Diagnosis not present

## 2012-09-18 DIAGNOSIS — J449 Chronic obstructive pulmonary disease, unspecified: Secondary | ICD-10-CM | POA: Diagnosis not present

## 2012-09-18 DIAGNOSIS — D509 Iron deficiency anemia, unspecified: Secondary | ICD-10-CM | POA: Diagnosis not present

## 2012-09-18 DIAGNOSIS — I10 Essential (primary) hypertension: Secondary | ICD-10-CM | POA: Diagnosis not present

## 2012-09-18 LAB — CBC CANCER CENTER
Comment - H1-Com8: NORMAL
Eosinophil: 6 %
MCH: 23.5 pg — ABNORMAL LOW (ref 26.0–34.0)
MCV: 75 fL — ABNORMAL LOW (ref 80–100)
Segmented Neutrophils: 60 %
WBC: 7.1 x10 3/mm (ref 3.8–10.6)

## 2012-09-24 ENCOUNTER — Ambulatory Visit (INDEPENDENT_AMBULATORY_CARE_PROVIDER_SITE_OTHER): Payer: Medicare Other | Admitting: Physician Assistant

## 2012-09-24 ENCOUNTER — Other Ambulatory Visit: Payer: Medicare Other

## 2012-09-24 ENCOUNTER — Encounter: Payer: Self-pay | Admitting: Physician Assistant

## 2012-09-24 VITALS — BP 150/80 | HR 70 | Ht 70.0 in | Wt 171.6 lb

## 2012-09-24 DIAGNOSIS — D509 Iron deficiency anemia, unspecified: Secondary | ICD-10-CM | POA: Diagnosis not present

## 2012-09-24 NOTE — Patient Instructions (Addendum)
Go to our lab for stool study instructions. We will get back to you with the results. You have been scheduled for an endoscopy with propofol. Please follow written instructions given to you at your visit today. If you use inhalers (even only as needed), please bring them with you on the day of your procedure. Your physician has requested that you go to www.startemmi.com and enter the access code given to you at your visit today. This web site gives a general overview about your procedure. However, you should still follow specific instructions given to you by our office regarding your preparation for the procedure.

## 2012-09-24 NOTE — Progress Notes (Signed)
Subjective:    Patient ID: George Washington, male    DOB: 1940-09-29, 72 y.o.   MRN: [id number]  HPI George Washington is a pleasant 72 year old white male known to Dr. Christella Hartigan who underwent screening colonoscopy in October of 2013. This was a normal exam. Patient is referred back at this time per Dr. Ruthe Mannan for evaluation of new finding of iron deficiency anemia. Patient relates history of a retinal vein occlusion, hypertension, early chronic kidney disease for which she is followed by [name]. Patient relates that he had some labs done as part of the physical recently and was found to have a hemoglobin of 8.1, MCV of 72, serum iron of 13 iron saturation of 3 ferritin of 8 and a TIBC of 372. He has been taking ferrous gluconate 3 times daily over the past few weeks and according to the patient had his hemoglobin checked last week which was up to 9 A do not have a copy of that lab . Patient says other than some recent fatigue he had not been having any other symptoms other than some mild exercise-induced shortness of breath with mowing the lawn etc. 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 His appetite has been fine his weight has been stable he denies any heartburn, indigestion ,nausea or abdominal pain. He has not had any changes in his bowel habits melena or hematochezia. Interestingly he does generally take 4 Excedrin tablets every day, 2 in the morning and 2 in the evening for chronic pain. His family history is negative for anemia  or GI disease.    Review of Systems  Constitutional: Positive for fatigue.  HENT: Negative.   Eyes: Negative.   Respiratory: Negative.   Cardiovascular: Negative.   Gastrointestinal: Negative.   Endocrine: Negative.   Genitourinary: Negative.   Musculoskeletal: Negative.   Skin: Negative.   Allergic/Immunologic: Negative.   Neurological: Negative.    Hematological: Negative.   Psychiatric/Behavioral: Negative.    Outpatient Prescriptions Prior to Visit  Medication Sig Dispense Refill  . aspirin-acetaminophen-caffeine (EXCEDRIN MIGRAINE) 250-250-65 MG per tablet Take 1 tablet by mouth every 6 (six) hours as needed.      . budesonide-formoterol (SYMBICORT) 160-4.5 MCG/ACT inhaler Inhale 1 puff into the lungs 2 (two) times daily.  1 Inhaler  5  . cyanocobalamin 1000 MCG tablet Take 1,000 mcg by mouth 3 (three) times daily.       Marland Kitchen FERROUS GLUCONATE PO Take 1 tablet by mouth 3 (three) times daily. Take one by mouth daily      . fish oil-omega-3 fatty acids 1000 MG capsule Take 1 g by mouth daily.      . hydrochlorothiazide (MICROZIDE) 12.5 MG capsule Take 1 capsule (12.5 mg total) by mouth daily.  30 capsule  5  . losartan (COZAAR) 25 MG tablet Take 1 tablet (25 mg total) by mouth daily.  30 tablet    . Multiple Vitamins-Minerals (CENTRUM SILVER PO) Take 1 tablet by mouth daily.      . vitamin E 400 UNIT capsule Take 400 Units by mouth daily.      Marland Kitchen albuterol (PROVENTIL HFA;VENTOLIN HFA) 108 (90 BASE) MCG/ACT inhaler Inhale 2 puffs four times a day  1 Inhaler  5   No facility-administered medications prior to visit.   Allergies  Allergen Reactions  . Lisinopril     cough   Patient Active Problem List  Diagnosis Date Noted  . Anemia, iron deficiency 09/11/2012  . Chronic kidney disease 07/09/2012  . COPD exacerbation 07/09/2012  . Hypertension 04/27/2011   History  Substance Use Topics  . Smoking status: Current Every Day Smoker -- 0.50 packs/day    Types: Cigarettes  . Smokeless tobacco: Never Used  . Alcohol Use: No   family history includes Heart disease (age of onset: 72) in his sister; Hyperlipidemia in his father; and Hypertension in his father.  There is no history of Colon cancer and Stomach cancer.     Objective:   Physical Exam well-developed older white male in no acute distress, pleasant blood pressure 150/80  pulse 70 height 5 foot 10 weight 171. HEENT; nontraumatic normocephalic EOMI PERRLA sclera anicteric, Supple; no JVD, Cardiovascular; regular rate and rhythm with S1-S2 there's soft systolic murmur, Pulmonary ;clear bilaterally, Abdomen; soft nontender nondistended bowel sounds are active there is no palpable mass or hepatosplenomegaly, Rectal; exam not done, Extremities ;no clubbing cyanosis or edema skin warm and dry, Psych; mood and affect normal and appropriate        Assessment & Plan:  #28 72 year old male with new finding of iron deficiency anemia. Patient had a normal colonoscopy October of 2013. Etiology of his iron deficiency is not clear, will need to rule out occult chronic or intermittent GI blood loss. Given his daily aspirin use in the form of Excedrin he may have an aspirin-induced gastropathy or enteropathy, rule out occult lesion, AVMs.  #2 early stage chronic kidney disease #3 hypertension #4 COPD  Plan; patient will complete Hemoccults Will schedule for upper endoscopy with small bowel biopsies with Dr. Bayard Hugger out celiac disease. Procedure discussed in detail with the patient and he is agreeable to proceed. If EGD is negative he'll need capsule endoscopy - this was discussed as well He will continue ferrous gluconate 3 times daily Discussed potential need to stop taking Excedrin-will wait for results of above evaluation

## 2012-09-25 NOTE — Progress Notes (Signed)
i agree with the plan outlined in this note 

## 2012-09-26 ENCOUNTER — Other Ambulatory Visit: Payer: Self-pay | Admitting: Family Medicine

## 2012-09-27 ENCOUNTER — Other Ambulatory Visit (INDEPENDENT_AMBULATORY_CARE_PROVIDER_SITE_OTHER): Payer: Medicare Other

## 2012-09-27 ENCOUNTER — Other Ambulatory Visit (INDEPENDENT_AMBULATORY_CARE_PROVIDER_SITE_OTHER): Payer: Self-pay

## 2012-09-27 DIAGNOSIS — D509 Iron deficiency anemia, unspecified: Secondary | ICD-10-CM

## 2012-09-27 LAB — FECAL OCCULT BLOOD, IMMUNOCHEMICAL: Fecal Occult Bld: NEGATIVE

## 2012-09-27 MED ORDER — LOSARTAN POTASSIUM 25 MG PO TABS: 25.0000 mg | ORAL_TABLET | Freq: Every day | ORAL | Status: DC

## 2012-10-09 ENCOUNTER — Encounter: Payer: Self-pay | Admitting: Gastroenterology

## 2012-10-09 ENCOUNTER — Ambulatory Visit (AMBULATORY_SURGERY_CENTER): Payer: Medicare Other | Admitting: Gastroenterology

## 2012-10-09 VITALS — BP 128/68 | HR 71 | Temp 97.4°F | Resp 21 | Ht 70.0 in | Wt 171.0 lb

## 2012-10-09 DIAGNOSIS — K2960 Other gastritis without bleeding: Secondary | ICD-10-CM

## 2012-10-09 DIAGNOSIS — K2990 Gastroduodenitis, unspecified, without bleeding: Secondary | ICD-10-CM

## 2012-10-09 DIAGNOSIS — D1330 Benign neoplasm of unspecified part of small intestine: Secondary | ICD-10-CM

## 2012-10-09 DIAGNOSIS — D509 Iron deficiency anemia, unspecified: Secondary | ICD-10-CM | POA: Diagnosis not present

## 2012-10-09 DIAGNOSIS — K2970 Gastritis, unspecified, without bleeding: Secondary | ICD-10-CM | POA: Diagnosis not present

## 2012-10-09 MED ORDER — SODIUM CHLORIDE 0.9 % IV SOLN: 500.0000 mL | INTRAVENOUS | Status: DC

## 2012-10-09 NOTE — Patient Instructions (Addendum)
One of your biggest health concerns is your smoking.  This increases your risk for most cancers and serious cardiovascular diseases such as strokes, heart attacks.  You should try your best to stop.  If you need assistance, please contact your PCP or Smoking Cessation Class at [facility] [phone]) or [facility] Quit-Line (1-800-QUIT-NOW).  YOU HAD AN ENDOSCOPIC PROCEDURE TODAY AT THE [facility]: Refer to the procedure report that was given to you for any specific questions about what was found during the examination.  If the procedure report does not answer your questions, please call your gastroenterologist to clarify.  If you requested that your care partner not be given the details of your procedure findings, then the procedure report has been included in a sealed envelope for you to review at your convenience later.  YOU SHOULD EXPECT: Some feelings of bloating in the abdomen. Passage of more gas than usual.  Walking can help get rid of the air that was put into your GI tract during the procedure and reduce the bloating. If you had a lower endoscopy (such as a colonoscopy or flexible sigmoidoscopy) you may notice spotting of blood in your stool or on the toilet paper. If you underwent a bowel prep for your procedure, then you may not have a normal bowel movement for a few days.  DIET: Your first meal following the procedure should be a light meal and then it is ok to progress to your normal diet.  A half-sandwich or bowl of soup is an example of a good first meal.  Heavy or fried foods are harder to digest and may make you feel nauseous or bloated.  Likewise meals heavy in dairy and vegetables can cause extra gas to form and this can also increase the bloating.  Drink plenty of fluids but you should avoid alcoholic beverages for 24 hours.  ACTIVITY: Your care partner should take you home directly after the procedure.  You should plan to take it easy, moving slowly for the rest of  the day.  You can resume normal activity the day after the procedure however you should NOT DRIVE or use heavy machinery for 24 hours (because of the sedation medicines used during the test).    SYMPTOMS TO REPORT IMMEDIATELY: A gastroenterologist can be reached at any hour.  During normal business hours, 8:30 AM to 5:00 PM Monday through Friday, call [phone].  After hours and on weekends, please call the GI answering service at [phone] who will take a message and have the physician on call contact you.   Following upper endoscopy (EGD)  Vomiting of blood or coffee ground material  New chest pain or pain under the shoulder blades  Painful or persistently difficult swallowing  New shortness of breath  Fever of 100F or higher  Black, tarry-looking stools  FOLLOW UP: If any biopsies were taken you will be contacted by phone or by letter within the next 1-3 weeks.  Call your gastroenterologist if you have not heard about the biopsies in 3 weeks.  Our staff will call the home number listed on your records the next business day following your procedure to check on you and address any questions or concerns that you may have at that time regarding the information given to you following your procedure. This is a courtesy call and so if there is no answer at the home number and we have not heard from you through the emergency physician on call, we will assume  that you have returned to your regular daily activities without incident.  SIGNATURES/CONFIDENTIALITY: You and/or your care partner have signed paperwork which will be entered into your electronic medical record.  These signatures attest to the fact that that the information above on your After Visit Summary has been reviewed and is understood.  Full responsibility of the confidentiality of this discharge information lies with you and/or your care-partner.  Recommendations Await biopsy results Try to refrain from aspirin daily use  unless indicated for cardiovascular reasons.  Begin once daily PPI

## 2012-10-09 NOTE — Op Note (Signed)
[facility] [address] [place], [address]   ENDOSCOPY PROCEDURE REPORT  PATIENT: George Washington, George Washington  MR#: [id number] BIRTHDATE: 1940/05/30 , 71  yrs. old GENDER: Male ENDOSCOPIST: Rachael Fee, MD PROCEDURE DATE:  10/09/2012 PROCEDURE:  EGD w/ biopsy ASA CLASS:     Class III INDICATIONS:  iron def anemia; colonoscopy 2013 essentially normal.  MEDICATIONS: Fentanyl 50 mcg IV, Versed 4 mg IV, and These medications were titrated to patient response per physician's verbal order TOPICAL ANESTHETIC: Cetacaine Spray  DESCRIPTION OF PROCEDURE: After the risks benefits and alternatives of the procedure were thoroughly explained, informed consent was obtained.  The LB [id number] endoscope was introduced through the mouth and advanced to the second portion of the duodenum. Without limitations.  The instrument was slowly withdrawn as the mucosa was fully examined.     There was moderate, non-specific gastritis that was most impressive distally.  Biopsies were taken.  The examination was otherwise normal however I biopsied the duodenum as well to check for Celiac Sprue.  Retroflexed views revealed no abnormalities.     The scope was then withdrawn from the patient and the procedure completed. COMPLICATIONS: There were no complications.  ENDOSCOPIC IMPRESSION: There was moderate, non-specific gastritis that was most impressive distally.  Biopsies were taken.  The examination was otherwise normal however I biopsied the duodenum as well to check for Celiac Sprue.  RECOMMENDATIONS: Await final biopsy results.  It is very possible that your daiy aspirin use is causing the moderate gastritis (which in turn is probably responsible for your anemia)  You should try to refrain from aspirin daily use unless indicated for cardiovasular reasons. Should also begin once daily PPI.    eSigned:  Rachael Fee, MD 10/09/2012 8:35 AM   CC: Ruthe Mannan, MD

## 2012-10-09 NOTE — Progress Notes (Signed)
Pt requests light sedation

## 2012-10-09 NOTE — Progress Notes (Signed)
Patient did not have preoperative order for IV antibiotic SSI prophylaxis. (G8918)  Patient did not experience any of the following events: a burn prior to discharge; a fall within the facility; wrong site/side/patient/procedure/implant event; or a hospital transfer or hospital admission upon discharge from the facility. (G8907)  

## 2012-10-10 ENCOUNTER — Encounter: Payer: Self-pay | Admitting: Family Medicine

## 2012-10-10 ENCOUNTER — Telehealth: Payer: Self-pay | Admitting: *Deleted

## 2012-10-10 NOTE — Telephone Encounter (Signed)
  Follow up Call-  Call back number 10/09/2012 01/09/2012  Post procedure Call Back phone  # [phone]  Permission to leave phone message Yes Yes    LMOM

## 2012-10-12 ENCOUNTER — Ambulatory Visit: Payer: Self-pay | Admitting: Internal Medicine

## 2012-10-17 ENCOUNTER — Encounter: Payer: Self-pay | Admitting: Gastroenterology

## 2012-11-06 DIAGNOSIS — H35359 Cystoid macular degeneration, unspecified eye: Secondary | ICD-10-CM | POA: Diagnosis not present

## 2012-11-06 DIAGNOSIS — H348192 Central retinal vein occlusion, unspecified eye, stable: Secondary | ICD-10-CM | POA: Diagnosis not present

## 2012-11-13 ENCOUNTER — Other Ambulatory Visit: Payer: Self-pay | Admitting: *Deleted

## 2012-11-13 MED ORDER — HYDROCHLOROTHIAZIDE 12.5 MG PO CAPS: 12.5000 mg | ORAL_CAPSULE | Freq: Every day | ORAL | Status: DC

## 2013-01-17 ENCOUNTER — Other Ambulatory Visit: Payer: Self-pay

## 2013-02-11 DIAGNOSIS — R809 Proteinuria, unspecified: Secondary | ICD-10-CM | POA: Diagnosis not present

## 2013-02-11 DIAGNOSIS — D631 Anemia in chronic kidney disease: Secondary | ICD-10-CM | POA: Diagnosis not present

## 2013-02-11 DIAGNOSIS — N2581 Secondary hyperparathyroidism of renal origin: Secondary | ICD-10-CM | POA: Diagnosis not present

## 2013-02-11 DIAGNOSIS — I10 Essential (primary) hypertension: Secondary | ICD-10-CM | POA: Diagnosis not present

## 2013-02-18 DIAGNOSIS — N2581 Secondary hyperparathyroidism of renal origin: Secondary | ICD-10-CM | POA: Diagnosis not present

## 2013-02-18 DIAGNOSIS — I10 Essential (primary) hypertension: Secondary | ICD-10-CM | POA: Diagnosis not present

## 2013-02-18 DIAGNOSIS — D631 Anemia in chronic kidney disease: Secondary | ICD-10-CM | POA: Diagnosis not present

## 2013-02-19 DIAGNOSIS — H35359 Cystoid macular degeneration, unspecified eye: Secondary | ICD-10-CM | POA: Diagnosis not present

## 2013-02-19 DIAGNOSIS — H348192 Central retinal vein occlusion, unspecified eye, stable: Secondary | ICD-10-CM | POA: Diagnosis not present

## 2013-02-19 DIAGNOSIS — H2510 Age-related nuclear cataract, unspecified eye: Secondary | ICD-10-CM | POA: Diagnosis not present

## 2013-03-05 DIAGNOSIS — H2510 Age-related nuclear cataract, unspecified eye: Secondary | ICD-10-CM | POA: Diagnosis not present

## 2013-03-05 DIAGNOSIS — H01009 Unspecified blepharitis unspecified eye, unspecified eyelid: Secondary | ICD-10-CM | POA: Diagnosis not present

## 2013-03-05 DIAGNOSIS — H35359 Cystoid macular degeneration, unspecified eye: Secondary | ICD-10-CM | POA: Diagnosis not present

## 2013-03-05 DIAGNOSIS — H349 Unspecified retinal vascular occlusion: Secondary | ICD-10-CM | POA: Diagnosis not present

## 2013-04-01 DIAGNOSIS — H35359 Cystoid macular degeneration, unspecified eye: Secondary | ICD-10-CM | POA: Diagnosis not present

## 2013-04-01 DIAGNOSIS — H01009 Unspecified blepharitis unspecified eye, unspecified eyelid: Secondary | ICD-10-CM | POA: Diagnosis not present

## 2013-04-01 DIAGNOSIS — H2510 Age-related nuclear cataract, unspecified eye: Secondary | ICD-10-CM | POA: Diagnosis not present

## 2013-04-01 DIAGNOSIS — H349 Unspecified retinal vascular occlusion: Secondary | ICD-10-CM | POA: Diagnosis not present

## 2013-04-29 DIAGNOSIS — H2589 Other age-related cataract: Secondary | ICD-10-CM | POA: Diagnosis not present

## 2013-04-29 DIAGNOSIS — I2510 Atherosclerotic heart disease of native coronary artery without angina pectoris: Secondary | ICD-10-CM | POA: Diagnosis not present

## 2013-04-29 DIAGNOSIS — H2510 Age-related nuclear cataract, unspecified eye: Secondary | ICD-10-CM | POA: Diagnosis not present

## 2013-05-28 DIAGNOSIS — H35359 Cystoid macular degeneration, unspecified eye: Secondary | ICD-10-CM | POA: Diagnosis not present

## 2013-05-28 DIAGNOSIS — H348192 Central retinal vein occlusion, unspecified eye, stable: Secondary | ICD-10-CM | POA: Diagnosis not present

## 2013-07-09 DIAGNOSIS — H348192 Central retinal vein occlusion, unspecified eye, stable: Secondary | ICD-10-CM | POA: Diagnosis not present

## 2013-07-09 DIAGNOSIS — H35359 Cystoid macular degeneration, unspecified eye: Secondary | ICD-10-CM | POA: Diagnosis not present

## 2013-07-15 DIAGNOSIS — Z961 Presence of intraocular lens: Secondary | ICD-10-CM | POA: Diagnosis not present

## 2013-07-15 DIAGNOSIS — H2510 Age-related nuclear cataract, unspecified eye: Secondary | ICD-10-CM | POA: Diagnosis not present

## 2013-07-23 DIAGNOSIS — H2510 Age-related nuclear cataract, unspecified eye: Secondary | ICD-10-CM | POA: Diagnosis not present

## 2013-07-23 DIAGNOSIS — I10 Essential (primary) hypertension: Secondary | ICD-10-CM | POA: Diagnosis not present

## 2013-07-23 DIAGNOSIS — F17200 Nicotine dependence, unspecified, uncomplicated: Secondary | ICD-10-CM | POA: Diagnosis not present

## 2013-07-23 DIAGNOSIS — I2510 Atherosclerotic heart disease of native coronary artery without angina pectoris: Secondary | ICD-10-CM | POA: Diagnosis not present

## 2013-07-23 DIAGNOSIS — H2589 Other age-related cataract: Secondary | ICD-10-CM | POA: Diagnosis not present

## 2013-10-01 DIAGNOSIS — H35359 Cystoid macular degeneration, unspecified eye: Secondary | ICD-10-CM | POA: Diagnosis not present

## 2013-10-01 DIAGNOSIS — H348192 Central retinal vein occlusion, unspecified eye, stable: Secondary | ICD-10-CM | POA: Diagnosis not present

## 2013-11-26 DIAGNOSIS — H3581 Retinal edema: Secondary | ICD-10-CM | POA: Diagnosis not present

## 2013-11-26 DIAGNOSIS — H348192 Central retinal vein occlusion, unspecified eye, stable: Secondary | ICD-10-CM | POA: Diagnosis not present

## 2014-01-15 ENCOUNTER — Telehealth: Payer: Self-pay | Admitting: *Deleted

## 2014-01-15 NOTE — Telephone Encounter (Signed)
Lmom to call our office. Time to schedule a Abd Aorta u/s (2 yr f/u).

## 2014-01-21 DIAGNOSIS — H34812 Central retinal vein occlusion, left eye: Secondary | ICD-10-CM | POA: Diagnosis not present

## 2014-01-21 DIAGNOSIS — H3581 Retinal edema: Secondary | ICD-10-CM | POA: Diagnosis not present

## 2014-04-01 DIAGNOSIS — H34812 Central retinal vein occlusion, left eye: Secondary | ICD-10-CM | POA: Diagnosis not present

## 2014-04-01 DIAGNOSIS — H3581 Retinal edema: Secondary | ICD-10-CM | POA: Diagnosis not present

## 2014-06-10 DIAGNOSIS — H3581 Retinal edema: Secondary | ICD-10-CM | POA: Diagnosis not present

## 2014-06-10 DIAGNOSIS — H34812 Central retinal vein occlusion, left eye: Secondary | ICD-10-CM | POA: Diagnosis not present

## 2014-08-19 DIAGNOSIS — H34812 Central retinal vein occlusion, left eye: Secondary | ICD-10-CM | POA: Diagnosis not present

## 2014-08-19 DIAGNOSIS — H3581 Retinal edema: Secondary | ICD-10-CM | POA: Diagnosis not present

## 2014-10-28 DIAGNOSIS — H3581 Retinal edema: Secondary | ICD-10-CM | POA: Diagnosis not present

## 2014-10-28 DIAGNOSIS — H34812 Central retinal vein occlusion, left eye: Secondary | ICD-10-CM | POA: Diagnosis not present

## 2014-11-04 ENCOUNTER — Other Ambulatory Visit (INDEPENDENT_AMBULATORY_CARE_PROVIDER_SITE_OTHER): Payer: Medicare Other

## 2014-11-04 ENCOUNTER — Telehealth: Payer: Self-pay | Admitting: Family Medicine

## 2014-11-04 DIAGNOSIS — N189 Chronic kidney disease, unspecified: Secondary | ICD-10-CM | POA: Diagnosis not present

## 2014-11-04 DIAGNOSIS — I10 Essential (primary) hypertension: Secondary | ICD-10-CM

## 2014-11-04 DIAGNOSIS — Z125 Encounter for screening for malignant neoplasm of prostate: Secondary | ICD-10-CM | POA: Diagnosis not present

## 2014-11-04 DIAGNOSIS — D509 Iron deficiency anemia, unspecified: Secondary | ICD-10-CM | POA: Diagnosis not present

## 2014-11-04 DIAGNOSIS — Z0000 Encounter for general adult medical examination without abnormal findings: Secondary | ICD-10-CM | POA: Diagnosis not present

## 2014-11-04 LAB — COMPREHENSIVE METABOLIC PANEL
ALBUMIN: 3.7 g/dL (ref 3.5–5.2)
ALK PHOS: 52 U/L (ref 39–117)
ALT: 10 U/L (ref 0–53)
AST: 17 U/L (ref 0–37)
BUN: 17 mg/dL (ref 6–23)
CALCIUM: 9.7 mg/dL (ref 8.4–10.5)
CHLORIDE: 104 meq/L (ref 96–112)
CO2: 29 mEq/L (ref 19–32)
CREATININE: 1.37 mg/dL (ref 0.40–1.50)
GFR: 54.02 mL/min — ABNORMAL LOW (ref >60.00)
Glucose, Bld: 94 mg/dL (ref 70–99)
POTASSIUM: 5.1 meq/L (ref 3.5–5.1)
Sodium: 138 mEq/L (ref 135–145)
TOTAL PROTEIN: 6.4 g/dL (ref 6.0–8.3)
Total Bilirubin: 0.3 mg/dL (ref 0.2–1.2)

## 2014-11-04 LAB — CBC WITH DIFFERENTIAL/PLATELET
BASOS PCT: 0.2 % (ref 0.0–3.0)
Basophils Absolute: 0 10*3/uL (ref 0.0–0.1)
EOS ABS: 0.1 10*3/uL (ref 0.0–0.7)
EOS PCT: 2.7 % (ref 0.0–5.0)
HEMATOCRIT: 33.8 % — AB (ref 39.0–52.0)
HEMOGLOBIN: 10.8 g/dL — AB (ref 13.0–17.0)
LYMPHS PCT: 26.3 % (ref 12.0–46.0)
Lymphs Abs: 1.4 10*3/uL (ref 0.7–4.0)
MCHC: 31.9 g/dL (ref 30.0–36.0)
MCV: 87.2 fl (ref 78.0–100.0)
Monocytes Absolute: 0.6 10*3/uL (ref 0.1–1.0)
Monocytes Relative: 10.6 % (ref 3.0–12.0)
NEUTROS ABS: 3.2 10*3/uL (ref 1.4–7.7)
Neutrophils Relative %: 60.2 % (ref 43.0–77.0)
PLATELETS: 290 10*3/uL (ref 150.0–400.0)
RBC: 3.88 Mil/uL — ABNORMAL LOW (ref 4.22–5.81)
RDW: 15 % (ref 11.5–15.5)
WBC: 5.3 10*3/uL (ref 4.0–10.5)

## 2014-11-04 LAB — LIPID PANEL
CHOLESTEROL: 165 mg/dL (ref 0–200)
HDL: 44.8 mg/dL (ref >39.00)
LDL CALC: 96 mg/dL (ref 0–99)
NonHDL: 119.89
TRIGLYCERIDES: 120 mg/dL (ref 0.0–149.0)
Total CHOL/HDL Ratio: 4
VLDL: 24 mg/dL (ref 0.0–40.0)

## 2014-11-04 LAB — PSA, MEDICARE: PSA: 1.05 ng/ml (ref 0.10–4.00)

## 2014-11-04 NOTE — Telephone Encounter (Signed)
i am not able to see a [employer]. Depending on which vaccines he is needing, he should be able to get them. If he is wanting shingles vaccine, he will first need to contact his insurance company and verify if it is covered, and if so, if he has to get it in office or at the pharmacy

## 2014-11-04 NOTE — Telephone Encounter (Signed)
That would be fine if he has them at his appt, but he will still need to call about the shingles (zostavax)

## 2014-11-04 NOTE — Addendum Note (Signed)
Addended by: Royann Shivers A on: 11/04/2014 03:38 PM   Modules accepted: Orders

## 2014-11-04 NOTE — Telephone Encounter (Signed)
Sorry i thought i attached my chart message  pointment Request From: Neal Dy.       With Provider: Arnette Norris, MD [facility] [place]      Preferred Date Range: Any date 11/04/2014 or later      Reason: To address the following health maintenance concerns.   Zostavax   Pna Vac Low Risk Adult   Influenza Vaccine      Comments:   I would like to schedule the shots only if they are covered by Medicare. Thank you.

## 2014-11-04 NOTE — Telephone Encounter (Signed)
Pt sent my chart message see below  Can he get his vaccines 11/11/14 when he is here for his cpx

## 2014-11-11 ENCOUNTER — Ambulatory Visit (INDEPENDENT_AMBULATORY_CARE_PROVIDER_SITE_OTHER): Payer: Medicare Other | Admitting: Family Medicine

## 2014-11-11 ENCOUNTER — Encounter: Payer: Self-pay | Admitting: Family Medicine

## 2014-11-11 VITALS — BP 108/62 | HR 73 | Temp 98.0°F | Ht 68.0 in | Wt 148.5 lb

## 2014-11-11 DIAGNOSIS — D509 Iron deficiency anemia, unspecified: Secondary | ICD-10-CM | POA: Diagnosis not present

## 2014-11-11 DIAGNOSIS — I159 Secondary hypertension, unspecified: Secondary | ICD-10-CM

## 2014-11-11 DIAGNOSIS — N182 Chronic kidney disease, stage 2 (mild): Secondary | ICD-10-CM | POA: Diagnosis not present

## 2014-11-11 DIAGNOSIS — R634 Abnormal weight loss: Secondary | ICD-10-CM | POA: Insufficient documentation

## 2014-11-11 DIAGNOSIS — I10 Essential (primary) hypertension: Secondary | ICD-10-CM

## 2014-11-11 DIAGNOSIS — Z0000 Encounter for general adult medical examination without abnormal findings: Secondary | ICD-10-CM | POA: Diagnosis not present

## 2014-11-11 DIAGNOSIS — Z23 Encounter for immunization: Secondary | ICD-10-CM

## 2014-11-11 DIAGNOSIS — M1990 Unspecified osteoarthritis, unspecified site: Secondary | ICD-10-CM | POA: Diagnosis not present

## 2014-11-11 MED ORDER — ZOSTER VACCINE LIVE 19400 UNT/0.65ML ~~LOC~~ SOLR: 0.6500 mL | Freq: Once | SUBCUTANEOUS | Status: DC

## 2014-11-11 NOTE — Assessment & Plan Note (Signed)
Cr stable and quite good today. No changes made. Followed by renal.

## 2014-11-11 NOTE — Addendum Note (Signed)
Addended by: Modena Nunnery on: 11/11/2014 01:42 PM   Modules accepted: Orders

## 2014-11-11 NOTE — Assessment & Plan Note (Signed)
The patients weight, height, BMI and visual acuity have been recorded in the chart.  Cognitive function assessed.   I have made referrals, counseling and provided education to the patient based review of the above and I have provided the pt with a written personalized care plan for preventive services.  Prevnar 13 and influenza vaccines given today. Rx for zostavax printed for pt to take to pharmacy.

## 2014-11-11 NOTE — Patient Instructions (Signed)
Good to see you. Please increase iron to twice daily.  Get your zostavax at the pharmacy.   Say hi to [facility] for me.

## 2014-11-11 NOTE — Progress Notes (Addendum)
Subjective:   Patient ID: George Dy., male    DOB: Sep 11, 1940, 74 y.o.   MRN: [id number]  George Washington. is a pleasant 74 y.o. year old male who presents to clinic today with Annual Exam and Arthritis  and follow up of chronic medical conditions on 11/11/2014  HPI:  I have personally reviewed the Medicare Annual Wellness questionnaire and have noted 1. The patient's medical and social history 2. Their use of alcohol, tobacco or illicit drugs 3. Their current medications and supplements 4. The patient's functional ability including ADL's, fall risks, home safety risks and hearing or visual             impairment. 5. Diet and physical activities 6. Evidence for depression or mood disorders  End of life wishes discussed and updated in Social History.  The roster of all physicians providing medical care to patient - is listed in the CareTeams section of the chart.  Weight loss- intentional. Cut back on portions.  Colonoscopy 01/09/12 Td 11/16/11 Pneumovax 11/16/11  Iron deficiency anemia- previous work up by hematology and GI. Restarted iron two weeks ago because he felt more tired.  Denies blood in his stool or urine. Lab Results  Component Value Date   WBC 5.3 11/04/2014   HGB 10.8* 11/04/2014   HCT 33.8* 11/04/2014   MCV 87.2 11/04/2014   PLT 290.0 11/04/2014   Arthritis- back has been bothering him more.  Has not been as active. Knows he needs to stay active.  HTN- taking cozaar 25 mg daily and hctz 12.5 mg daily.  CKD- followed by renal.  Cr stable.  Lab Results  Component Value Date   CREATININE 1.37 11/04/2014   Lab Results  Component Value Date   CHOL 165 11/04/2014   HDL 44.80 11/04/2014   LDLCALC 96 11/04/2014   TRIG 120.0 11/04/2014   CHOLHDL 4 11/04/2014   Lab Results  Component Value Date   PSA 1.05 11/04/2014   PSA 1.16 11/16/2011   Lab Results  Component Value Date   NA 138 11/04/2014   K 5.1 11/04/2014   CL 104 11/04/2014   CO2 29  11/04/2014   Lab Results  Component Value Date   ALT 10 11/04/2014   AST 17 11/04/2014   ALKPHOS 52 11/04/2014   BILITOT 0.3 11/04/2014    Current Outpatient Prescriptions on File Prior to Visit  Medication Sig Dispense Refill  . aspirin-acetaminophen-caffeine (EXCEDRIN MIGRAINE) 250-250-65 MG per tablet Take 1 tablet by mouth every 6 (six) hours as needed.    . cyanocobalamin 1000 MCG tablet Take 1,000 mcg by mouth 3 (three) times daily.     Marland Kitchen FERROUS GLUCONATE PO Take 1 tablet by mouth 3 (three) times daily. Take one by mouth daily    . fish oil-omega-3 fatty acids 1000 MG capsule Take 1 g by mouth daily.    . folic acid (FOLVITE) 1 MG tablet Take 1 mg by mouth 3 (three) times daily.    . hydrochlorothiazide (MICROZIDE) 12.5 MG capsule Take 1 capsule (12.5 mg total) by mouth daily. 30 capsule 5  . losartan (COZAAR) 25 MG tablet Take 1 tablet (25 mg total) by mouth daily. 30 tablet 6  . Multiple Vitamins-Minerals (CENTRUM SILVER PO) Take 1 tablet by mouth daily.    . vitamin C (ASCORBIC ACID) 500 MG tablet Take 500 mg by mouth 3 (three) times daily.    . vitamin E 400 UNIT capsule Take 400 Units by mouth daily.  No current facility-administered medications on file prior to visit.    Allergies  Allergen Reactions  . Lisinopril     cough    Past Medical History  Diagnosis Date  . Narcotic dependence, in remission   . Arthritis   . Retinal vein occlusion   . HTN (hypertension)     Past Surgical History  Procedure Laterality Date  . Vro    . Eye surgery    . Cyst chest  1994    left  . Rectal fissure  1968    Family History  Problem Relation Age of Onset  . Hyperlipidemia Father   . Hypertension Father   . Heart disease Sister 6    MI, CABG  . Colon cancer Neg Hx   . Stomach cancer Neg Hx     Social History   Social History  . Marital Status: Married    Spouse Name: N/A  . Number of Children: 2  . Years of Education: N/A   Occupational History  .  pharmasist    Social History Main Topics  . Smoking status: Current Every Day Smoker -- 0.50 packs/day    Types: Cigarettes  . Smokeless tobacco: Never Used  . Alcohol Use: No  . Drug Use: No  . Sexual Activity: Not on file   Other Topics Concern  . 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      Does have a living will- does not want prolonged life support if futile.   The PMH, PSH, Social History, Family History, Medications, and allergies have been reviewed in [facility], and have been updated if relevant.  Review of Systems  Constitutional: Positive for fatigue. Negative for fever.  HENT: Negative.   Eyes: Negative.   Respiratory: Negative.   Cardiovascular: Negative.   Gastrointestinal: Negative.   Endocrine: Negative.   Genitourinary: Negative.   Musculoskeletal: Negative.   Skin: Negative.   Allergic/Immunologic: Negative.   Neurological: Negative.   Hematological: Negative.   Psychiatric/Behavioral: Negative.   All other systems reviewed and are negative.      Objective:    BP 108/62 mmHg  Pulse 73  Temp(Src) 98 F (36.7 C) (Oral)  Ht 5\' 8"  (1.727 m)  Wt 148 lb 8 oz (67.359 kg)  BMI 22.58 kg/m2  SpO2 96%  Wt Readings from Last 3 Encounters:  11/11/14 148 lb 8 oz (67.359 kg)  10/09/12 171 lb (77.565 kg)  09/24/12 171 lb 9.6 oz (77.837 kg)    Physical Exam    General:  Pleasant male in NAD Eyes:  PERRL Ears:  External ear exam shows no significant lesions or deformities.  Otoscopic examination reveals clear canals, tympanic membranes are intact bilaterally without bulging, retraction, inflammation or discharge. Hearing is grossly normal bilaterally. Nose:  External nasal examination shows no deformity or inflammation. Nasal mucosa are pink and moist without lesions or exudates. Mouth:  Oral mucosa and oropharynx without lesions or exudates.  Teeth in good repair. Neck:  no carotid bruit or  thyromegaly no cervical or supraclavicular lymphadenopathy  Lungs:  Normal respiratory effort, chest expands symmetrically. Lungs are clear to auscultation, no crackles or wheezes. Heart:  Normal rate and regular rhythm. S1 and S2 normal without gallop, murmur, click, rub or other extra sounds. Abdomen:  Bowel sounds positive,abdomen soft and non-tender without masses, organomegaly or hernias noted. Pulses:  R and L posterior tibial pulses are full  and equal bilaterally  Extremities:  no edema      Assessment & Plan:   Essential hypertension  Chronic kidney disease, stage 2 (mild)  Anemia, iron deficiency  Medicare annual wellness visit, subsequent  Arthritis  Loss of weight  Need for prophylactic vaccination against Streptococcus pneumoniae (pneumococcus) - Plan: Pneumococcal conjugate vaccine 13-valent IM  Need for prophylactic vaccination and inoculation against cholera alone - Plan: Flu Vaccine QUAD 36+ mos PF IM (Fluarix & Fluzone Quad PF) No Follow-up on file.

## 2014-11-11 NOTE — Assessment & Plan Note (Signed)
Deteriorated.   He has already restarted iron. This has been worked up by hematology and GI. Recheck iron studies in 2 months. The patient indicates understanding of these issues and agrees with the plan.

## 2014-11-11 NOTE — Progress Notes (Signed)
Pre visit review using our clinic review tool, if applicable. No additional management support is needed unless otherwise documented below in the visit note. 

## 2014-11-11 NOTE — Assessment & Plan Note (Signed)
Well controlled on current rx. No changes made. 

## 2014-11-11 NOTE — Assessment & Plan Note (Signed)
Intentional

## 2014-11-12 ENCOUNTER — Telehealth: Payer: Self-pay | Admitting: Family Medicine

## 2014-11-12 NOTE — Telephone Encounter (Signed)
pts chart updated to reflect changes

## 2014-11-12 NOTE — Telephone Encounter (Signed)
Pt sent my chart message see below  Appointment Request From: George Washington.       With Provider: Arnette Norris, MD [facility] [place]      Preferred Date Range: Any date 11/12/2014 or later      Reason: To address the following health maintenance concerns.   Influenza Vaccine      Comments:   I was given the Flu shot yesterday on 11/11/2014. Please update records. Thank you!

## 2014-11-12 NOTE — Addendum Note (Signed)
Addended by: Modena Nunnery on: 11/12/2014 11:34 AM   Modules accepted: Orders

## 2015-01-06 ENCOUNTER — Other Ambulatory Visit: Payer: Self-pay | Admitting: Family Medicine

## 2015-01-06 DIAGNOSIS — H34812 Central retinal vein occlusion, left eye, with macular edema: Secondary | ICD-10-CM | POA: Diagnosis not present

## 2015-01-06 DIAGNOSIS — D509 Iron deficiency anemia, unspecified: Secondary | ICD-10-CM

## 2015-01-13 ENCOUNTER — Other Ambulatory Visit (INDEPENDENT_AMBULATORY_CARE_PROVIDER_SITE_OTHER): Payer: Medicare Other

## 2015-01-13 DIAGNOSIS — D509 Iron deficiency anemia, unspecified: Secondary | ICD-10-CM | POA: Diagnosis not present

## 2015-01-13 LAB — CBC WITH DIFFERENTIAL/PLATELET
Basophils Absolute: 0 10*3/uL (ref 0.0–0.1)
Basophils Relative: 0.6 % (ref 0.0–3.0)
EOS PCT: 3.3 % (ref 0.0–5.0)
Eosinophils Absolute: 0.2 10*3/uL (ref 0.0–0.7)
HEMATOCRIT: 35.9 % — AB (ref 39.0–52.0)
HEMOGLOBIN: 11.3 g/dL — AB (ref 13.0–17.0)
LYMPHS ABS: 1 10*3/uL (ref 0.7–4.0)
LYMPHS PCT: 20.6 % (ref 12.0–46.0)
MCHC: 31.6 g/dL (ref 30.0–36.0)
MCV: 87.5 fl (ref 78.0–100.0)
MONOS PCT: 10.1 % (ref 3.0–12.0)
Monocytes Absolute: 0.5 10*3/uL (ref 0.1–1.0)
NEUTROS PCT: 65.4 % (ref 43.0–77.0)
Neutro Abs: 3.2 10*3/uL (ref 1.4–7.7)
Platelets: 268 10*3/uL (ref 150.0–400.0)
RBC: 4.1 Mil/uL — AB (ref 4.22–5.81)
RDW: 18.4 % — ABNORMAL HIGH (ref 11.5–15.5)
WBC: 4.9 10*3/uL (ref 4.0–10.5)

## 2015-01-13 LAB — IRON: Iron: 26 ug/dL — ABNORMAL LOW (ref 42–165)

## 2015-01-13 LAB — FERRITIN: Ferritin: 10 ng/mL — ABNORMAL LOW (ref 22.0–322.0)

## 2015-02-25 ENCOUNTER — Encounter: Payer: Self-pay | Admitting: Family Medicine

## 2015-03-03 ENCOUNTER — Other Ambulatory Visit: Payer: Self-pay | Admitting: Family Medicine

## 2015-03-03 ENCOUNTER — Encounter: Payer: Self-pay | Admitting: Family Medicine

## 2015-03-03 ENCOUNTER — Ambulatory Visit (INDEPENDENT_AMBULATORY_CARE_PROVIDER_SITE_OTHER)
Admission: RE | Admit: 2015-03-03 | Discharge: 2015-03-03 | Disposition: A | Discharge status: Home / Self Care | Payer: Medicare Other | Source: Ambulatory Visit | Attending: Family Medicine | Admitting: Family Medicine

## 2015-03-03 ENCOUNTER — Ambulatory Visit (INDEPENDENT_AMBULATORY_CARE_PROVIDER_SITE_OTHER): Payer: Medicare Other | Admitting: Family Medicine

## 2015-03-03 VITALS — BP 114/64 | HR 86 | Temp 98.3°F | Wt 154.5 lb

## 2015-03-03 DIAGNOSIS — M5442 Lumbago with sciatica, left side: Secondary | ICD-10-CM | POA: Diagnosis not present

## 2015-03-03 DIAGNOSIS — G8929 Other chronic pain: Secondary | ICD-10-CM | POA: Insufficient documentation

## 2015-03-03 MED ORDER — CELECOXIB 100 MG PO CAPS: 100.0000 mg | ORAL_CAPSULE | Freq: Two times a day (BID) | ORAL | Status: DC

## 2015-03-03 NOTE — Patient Instructions (Signed)
Happy Holidays!  I will call you with your xray results.

## 2015-03-03 NOTE — Progress Notes (Signed)
Pre visit review using our clinic review tool, if applicable. No additional management support is needed unless otherwise documented below in the visit note. 

## 2015-03-03 NOTE — Progress Notes (Signed)
SUBJECTIVE:  George Washington. is a 74 y.o. male who complains of low back pain for 2 month(s), positional with bending or lifting, with radiation down the legs. Precipitating factors: none recalled by the patient. Prior history of back problems: recurrent self limited episodes of low back pain in the past. There is no numbness in the legs.  Current Outpatient Prescriptions on File Prior to Visit  Medication Sig Dispense Refill  . aspirin-acetaminophen-caffeine (EXCEDRIN MIGRAINE) 250-250-65 MG per tablet Take 1 tablet by mouth every 6 (six) hours as needed.    . cyanocobalamin 1000 MCG tablet Take 1,000 mcg by mouth 3 (three) times daily.     Marland Kitchen FERROUS GLUCONATE PO Take 1 tablet by mouth 3 (three) times daily. Take one by mouth daily    . fish oil-omega-3 fatty acids 1000 MG capsule Take 1 g by mouth daily.    . folic acid (FOLVITE) 1 MG tablet Take 1 mg by mouth 3 (three) times daily.    . hydrochlorothiazide (MICROZIDE) 12.5 MG capsule Take 1 capsule (12.5 mg total) by mouth daily. 30 capsule 5  . losartan (COZAAR) 25 MG tablet Take 1 tablet (25 mg total) by mouth daily. 30 tablet 6  . Multiple Vitamins-Minerals (CENTRUM SILVER PO) Take 1 tablet by mouth daily.    . vitamin C (ASCORBIC ACID) 500 MG tablet Take 500 mg by mouth 3 (three) times daily.    . vitamin E 400 UNIT capsule Take 400 Units by mouth daily.     No current facility-administered medications on file prior to visit.    Allergies  Allergen Reactions  . Lisinopril     cough    Past Medical History  Diagnosis Date  . Narcotic dependence, in remission ([place])   . Arthritis   . Retinal vein occlusion   . HTN (hypertension)     Past Surgical History  Procedure Laterality Date  . Vro    . Eye surgery    . Cyst chest  1994    left  . Rectal fissure  1968    Family History  Problem Relation Age of Onset  . Hyperlipidemia Father   . Hypertension Father   . Heart disease Sister 80    MI, CABG  . Colon cancer Neg Hx    . Stomach cancer Neg Hx     Social History   Social History  . Marital Status: Married    Spouse Name: N/A  . Number of Children: 2  . Years of Education: N/A   Occupational History  . pharmasist    Social History Main Topics  . Smoking status: Current Every Day Smoker -- 0.50 packs/day    Types: Cigarettes  . Smokeless tobacco: Never Used  . Alcohol Use: No  . Drug Use: No  . Sexual Activity: Not on file   Other Topics Concern  . 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      Does have a living will- does not want prolonged life support if futile.   The PMH, PSH, Social History, Family History, Medications, and allergies have been reviewed in [facility], and have been updated if relevant.  OBJECTIVE: BP 114/64 mmHg  Pulse 86  Temp(Src) 98.3 F (36.8 C) (Oral)  Wt 154 lb 8 oz (70.081 kg)  Patient appears to be in mild to moderate pain, antalgic gait noted. Lumbosacral spine area reveals no local  tenderness or mass.  Painful and reduced LS ROM noted. Straight leg raise is present at 45 degrees on bilateral. DTR's, motor strength and sensation normal, including heel and toe gait.  Peripheral pulses are palpable. X-Ray: ordered, but results not yet available.  ASSESSMENT:  lumbar strain and osteoarthritis of lumbo-sacral spine  PLAN: For acute pain, rest, intermittent application of heat (do not sleep on heating pad), analgesics and muscle relaxants are recommended.  Celebrex as needed for severe pain, tylenol scheduled. Xray today. Call or return to clinic prn if these symptoms worsen or fail to improve as anticipated. The patient indicates understanding of these issues and agrees with the plan.

## 2015-03-26 ENCOUNTER — Other Ambulatory Visit: Payer: Self-pay | Admitting: Family Medicine

## 2015-03-27 ENCOUNTER — Encounter: Payer: Self-pay | Admitting: Family Medicine

## 2015-03-29 ENCOUNTER — Encounter: Payer: Self-pay | Admitting: Family Medicine

## 2015-03-31 DIAGNOSIS — H34812 Central retinal vein occlusion, left eye, with macular edema: Secondary | ICD-10-CM | POA: Diagnosis not present

## 2015-04-01 ENCOUNTER — Encounter: Payer: Self-pay | Admitting: Family Medicine

## 2015-04-01 ENCOUNTER — Ambulatory Visit (INDEPENDENT_AMBULATORY_CARE_PROVIDER_SITE_OTHER): Payer: Medicare Other | Admitting: Family Medicine

## 2015-04-01 VITALS — BP 132/64 | HR 72 | Temp 98.5°F | Wt 156.5 lb

## 2015-04-01 DIAGNOSIS — G4700 Insomnia, unspecified: Secondary | ICD-10-CM | POA: Insufficient documentation

## 2015-04-01 MED ORDER — TRAZODONE HCL 50 MG PO TABS: 25.0000 mg | ORAL_TABLET | Freq: Every evening | ORAL | Status: DC | PRN

## 2015-04-01 MED ORDER — ZOLPIDEM TARTRATE 5 MG PO TABS: 5.0000 mg | ORAL_TABLET | Freq: Every evening | ORAL | Status: DC | PRN

## 2015-04-01 NOTE — Progress Notes (Signed)
Pre visit review using our clinic review tool, if applicable. No additional management support is needed unless otherwise documented below in the visit note. 

## 2015-04-01 NOTE — Assessment & Plan Note (Addendum)
>  25 min spent with patient, at least half of which was spent on counseling insomnia.  The problem of recurrent insomnia is discussed. Avoidance of caffeine sources is strongly encouraged. Sleep hygiene issues are reviewed. The use of sedative hypnotics for temporary relief is appropriate; we discussed the addictive nature of these drugs, and a one-time only prescription for prn use of a hypnotic is given, to use no more than 3 times per week for 2-3 weeks.  eRx sent for trazodone to use prn insomnia nightly with printed rx for ambien given to pt for refractory insomnia. Call or return to clinic prn if these symptoms worsen or fail to improve as anticipated. The patient indicates understanding of these issues and agrees with the plan.

## 2015-04-01 NOTE — Progress Notes (Signed)
Subjective:   Patient ID: George Dy., male    DOB: [birth year], 75 y.o.   MRN: [id number]  George Mccart. is a pleasant 75 y.o. year old male who presents to clinic today with Follow-up  on 04/01/2015  HPI: Insomnia- past several months increased difficulty falling and staying asleep.  Denies feeling depressed or anxious. At times, wakes up because he has to urinate but other times, he is not sure what wakes him.  Current Outpatient Prescriptions on File Prior to Visit  Medication Sig Dispense Refill  . aspirin-acetaminophen-caffeine (EXCEDRIN MIGRAINE) 250-250-65 MG per tablet Take 1 tablet by mouth every 6 (six) hours as needed.    . celecoxib (CELEBREX) 100 MG capsule TAKE 1 CAPSULE(100 MG) BY MOUTH TWICE DAILY 60 capsule 2  . cyanocobalamin 1000 MCG tablet Take 1,000 mcg by mouth 3 (three) times daily.     Marland Kitchen FERROUS GLUCONATE PO Take 1 tablet by mouth 3 (three) times daily. Take one by mouth daily    . fish oil-omega-3 fatty acids 1000 MG capsule Take 1 g by mouth daily.    . folic acid (FOLVITE) 1 MG tablet Take 1 mg by mouth 3 (three) times daily.    . hydrochlorothiazide (MICROZIDE) 12.5 MG capsule Take 1 capsule (12.5 mg total) by mouth daily. 30 capsule 5  . losartan (COZAAR) 25 MG tablet Take 1 tablet (25 mg total) by mouth daily. 30 tablet 6  . Multiple Vitamins-Minerals (CENTRUM SILVER PO) Take 1 tablet by mouth daily.    . vitamin C (ASCORBIC ACID) 500 MG tablet Take 500 mg by mouth 3 (three) times daily.    . vitamin E 400 UNIT capsule Take 400 Units by mouth daily.     No current facility-administered medications on file prior to visit.    Allergies  Allergen Reactions  . Lisinopril     cough    Past Medical History  Diagnosis Date  . Narcotic dependence, in remission ([place])   . Arthritis   . Retinal vein occlusion   . HTN (hypertension)     Past Surgical History  Procedure Laterality Date  . Vro    . Eye surgery    . Cyst chest  1994    left  .  Rectal fissure  1968    Family History  Problem Relation Age of Onset  . Hyperlipidemia Father   . Hypertension Father   . Heart disease Sister 31    MI, CABG  . Colon cancer Neg Hx   . Stomach cancer Neg Hx     Social History   Social History  . Marital Status: Married    Spouse Name: N/A  . Number of Children: 2  . Years of Education: N/A   Occupational History  . pharmasist    Social History Main Topics  . Smoking status: Current Every Day Smoker -- 0.50 packs/day    Types: Cigarettes  . Smokeless tobacco: Never Used  . Alcohol Use: No  . Drug Use: No  . Sexual Activity: Not on file   Other Topics Concern  . 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      Does have a living will- does not want prolonged life support if futile.   The PMH, PSH, Social History, Family History, Medications, and allergies have been reviewed in [facility], and have been updated if relevant.  Review of Systems  Psychiatric/Behavioral: Positive for sleep disturbance. Negative for suicidal ideas, hallucinations, behavioral problems, confusion, self-injury, dysphoric mood, decreased concentration and agitation. The patient is not nervous/anxious and is not hyperactive.   All other systems reviewed and are negative.      Objective:    BP 132/64 mmHg  Pulse 72  Temp(Src) 98.5 F (36.9 C) (Oral)  Wt 156 lb 8 oz (70.988 kg)  SpO2 94%   Physical Exam  Constitutional: He is oriented to person, place, and time. He appears well-developed and well-nourished. No distress.  HENT:  Head: Normocephalic.  Eyes: Conjunctivae are normal.  Cardiovascular: Normal rate.   Pulmonary/Chest: Effort normal.  Musculoskeletal: Normal range of motion.  Neurological: He is alert and oriented to person, place, and time. No cranial nerve deficit.  Skin: Skin is warm and dry. He is not diaphoretic.  Psychiatric: He has a normal mood and  affect. His behavior is normal. Judgment and thought content normal.  Nursing note and vitals reviewed.         Assessment & Plan:   Insomnia No Follow-up on file.

## 2015-04-01 NOTE — Patient Instructions (Signed)
Great to see you. Please start taking Trazodone nightly at bedtime, ambien as needed for severe insomnia.  Please keep me updated.

## 2015-04-08 ENCOUNTER — Encounter: Payer: Self-pay | Admitting: Family Medicine

## 2015-04-14 ENCOUNTER — Telehealth: Payer: Self-pay | Admitting: *Deleted

## 2015-04-14 NOTE — Telephone Encounter (Signed)
Completed form received and faxed back to requested party

## 2015-04-14 NOTE — Telephone Encounter (Signed)
Form signed and in my box. 

## 2015-04-14 NOTE — Telephone Encounter (Signed)
Form received indicating PA required for pts ambien Rx. Form completed and placed in Dr Hulen Shouts inbox for sig

## 2015-04-15 NOTE — Telephone Encounter (Signed)
Response received. Medication approved through 03/13/2016. Approval faxed to pharmacy and sent for scanning

## 2015-06-22 ENCOUNTER — Encounter: Payer: Self-pay | Admitting: Family Medicine

## 2015-06-23 DIAGNOSIS — H34812 Central retinal vein occlusion, left eye, with macular edema: Secondary | ICD-10-CM | POA: Diagnosis not present

## 2015-06-23 MED ORDER — CELECOXIB 100 MG PO CAPS: ORAL_CAPSULE | ORAL | Status: DC

## 2015-07-20 ENCOUNTER — Other Ambulatory Visit: Payer: Self-pay | Admitting: Family Medicine

## 2015-07-20 ENCOUNTER — Encounter: Payer: Self-pay | Admitting: Family Medicine

## 2015-07-20 MED ORDER — TRAZODONE HCL 50 MG PO TABS: 25.0000 mg | ORAL_TABLET | Freq: Every evening | ORAL | Status: DC | PRN

## 2015-07-20 NOTE — Telephone Encounter (Signed)
Should pt be on both trazodone and ambien? Last f/u 03/2015

## 2015-09-17 ENCOUNTER — Encounter: Payer: Self-pay | Admitting: Family Medicine

## 2015-09-17 MED ORDER — CELECOXIB 100 MG PO CAPS: ORAL_CAPSULE | ORAL | Status: DC

## 2015-09-22 DIAGNOSIS — H35031 Hypertensive retinopathy, right eye: Secondary | ICD-10-CM | POA: Diagnosis not present

## 2015-09-22 DIAGNOSIS — H34812 Central retinal vein occlusion, left eye, with macular edema: Secondary | ICD-10-CM | POA: Diagnosis not present

## 2015-10-16 ENCOUNTER — Telehealth: Payer: Self-pay | Admitting: Family Medicine

## 2015-10-16 NOTE — Telephone Encounter (Signed)
LM for pt to sch AWV on 8/29 at 8:15, in addition to existing lab appt, [place]n

## 2015-11-05 ENCOUNTER — Other Ambulatory Visit: Payer: Self-pay | Admitting: Family Medicine

## 2015-11-05 DIAGNOSIS — Z0000 Encounter for general adult medical examination without abnormal findings: Secondary | ICD-10-CM | POA: Insufficient documentation

## 2015-11-05 DIAGNOSIS — D509 Iron deficiency anemia, unspecified: Secondary | ICD-10-CM

## 2015-11-06 ENCOUNTER — Encounter: Payer: Self-pay | Admitting: Family Medicine

## 2015-11-06 MED ORDER — TRAZODONE HCL 50 MG PO TABS: 25.0000 mg | ORAL_TABLET | Freq: Every evening | ORAL | 0 refills | Status: DC | PRN

## 2015-11-06 MED ORDER — CELECOXIB 100 MG PO CAPS: ORAL_CAPSULE | ORAL | 0 refills | Status: DC

## 2015-11-10 ENCOUNTER — Other Ambulatory Visit (INDEPENDENT_AMBULATORY_CARE_PROVIDER_SITE_OTHER): Payer: Medicare Other

## 2015-11-10 DIAGNOSIS — D509 Iron deficiency anemia, unspecified: Secondary | ICD-10-CM

## 2015-11-10 DIAGNOSIS — Z0000 Encounter for general adult medical examination without abnormal findings: Secondary | ICD-10-CM

## 2015-11-10 LAB — COMPREHENSIVE METABOLIC PANEL
ALBUMIN: 4 g/dL (ref 3.5–5.2)
ALT: 12 U/L (ref 0–53)
AST: 19 U/L (ref 0–37)
Alkaline Phosphatase: 55 U/L (ref 39–117)
BUN: 20 mg/dL (ref 6–23)
CO2: 28 meq/L (ref 19–32)
Calcium: 9.2 mg/dL (ref 8.4–10.5)
Chloride: 101 mEq/L (ref 96–112)
Creatinine, Ser: 1.51 mg/dL — ABNORMAL HIGH (ref 0.40–1.50)
GFR: 48.15 mL/min — ABNORMAL LOW (ref >60.00)
GLUCOSE: 91 mg/dL (ref 70–99)
POTASSIUM: 4.8 meq/L (ref 3.5–5.1)
SODIUM: 136 meq/L (ref 135–145)
TOTAL PROTEIN: 6.8 g/dL (ref 6.0–8.3)
Total Bilirubin: 0.4 mg/dL (ref 0.2–1.2)

## 2015-11-10 LAB — CBC WITH DIFFERENTIAL/PLATELET
BASOS PCT: 0.5 % (ref 0.0–3.0)
Basophils Absolute: 0 10*3/uL (ref 0.0–0.1)
EOS PCT: 3.7 % (ref 0.0–5.0)
Eosinophils Absolute: 0.2 10*3/uL (ref 0.0–0.7)
HCT: 36.9 % — ABNORMAL LOW (ref 39.0–52.0)
Hemoglobin: 12.5 g/dL — ABNORMAL LOW (ref 13.0–17.0)
LYMPHS ABS: 1.5 10*3/uL (ref 0.7–4.0)
Lymphocytes Relative: 26.4 % (ref 12.0–46.0)
MCHC: 33.8 g/dL (ref 30.0–36.0)
MCV: 84.5 fl (ref 78.0–100.0)
MONOS PCT: 9.9 % (ref 3.0–12.0)
Monocytes Absolute: 0.5 10*3/uL (ref 0.1–1.0)
NEUTROS ABS: 3.3 10*3/uL (ref 1.4–7.7)
NEUTROS PCT: 59.5 % (ref 43.0–77.0)
PLATELETS: 230 10*3/uL (ref 150.0–400.0)
RBC: 4.36 Mil/uL (ref 4.22–5.81)
RDW: 16.2 % — ABNORMAL HIGH (ref 11.5–15.5)
WBC: 5.5 10*3/uL (ref 4.0–10.5)

## 2015-11-10 LAB — LIPID PANEL
CHOLESTEROL: 200 mg/dL (ref 0–200)
HDL: 49.7 mg/dL (ref >39.00)
LDL CALC: 129 mg/dL — AB (ref 0–99)
NonHDL: 150.51
TRIGLYCERIDES: 108 mg/dL (ref 0.0–149.0)
Total CHOL/HDL Ratio: 4
VLDL: 21.6 mg/dL (ref 0.0–40.0)

## 2015-11-10 LAB — PSA: PSA: 1.05 ng/mL (ref 0.10–4.00)

## 2015-11-13 ENCOUNTER — Ambulatory Visit: Payer: Medicare Other

## 2015-11-17 ENCOUNTER — Encounter: Payer: Medicare Other | Admitting: Family Medicine

## 2015-11-19 ENCOUNTER — Encounter: Payer: Self-pay | Admitting: Family Medicine

## 2015-11-19 ENCOUNTER — Ambulatory Visit (INDEPENDENT_AMBULATORY_CARE_PROVIDER_SITE_OTHER): Payer: Medicare Other | Admitting: Family Medicine

## 2015-11-19 VITALS — BP 116/68 | HR 68 | Temp 98.4°F | Ht 67.75 in | Wt 152.8 lb

## 2015-11-19 DIAGNOSIS — M1990 Unspecified osteoarthritis, unspecified site: Secondary | ICD-10-CM

## 2015-11-19 DIAGNOSIS — G4700 Insomnia, unspecified: Secondary | ICD-10-CM

## 2015-11-19 DIAGNOSIS — I159 Secondary hypertension, unspecified: Secondary | ICD-10-CM

## 2015-11-19 DIAGNOSIS — Z23 Encounter for immunization: Secondary | ICD-10-CM

## 2015-11-19 DIAGNOSIS — Z0000 Encounter for general adult medical examination without abnormal findings: Secondary | ICD-10-CM

## 2015-11-19 DIAGNOSIS — N182 Chronic kidney disease, stage 2 (mild): Secondary | ICD-10-CM | POA: Diagnosis not present

## 2015-11-19 DIAGNOSIS — D509 Iron deficiency anemia, unspecified: Secondary | ICD-10-CM | POA: Diagnosis not present

## 2015-11-19 MED ORDER — CELECOXIB 100 MG PO CAPS: ORAL_CAPSULE | ORAL | 5 refills | Status: DC

## 2015-11-19 MED ORDER — TRAZODONE HCL 50 MG PO TABS: 25.0000 mg | ORAL_TABLET | Freq: Every evening | ORAL | 5 refills | Status: DC | PRN

## 2015-11-19 NOTE — Assessment & Plan Note (Addendum)
  H/H stable without iron.

## 2015-11-19 NOTE — Assessment & Plan Note (Signed)
Cr stable. No changes made. Followed by renal.

## 2015-11-19 NOTE — Progress Notes (Signed)
Pre visit review using our clinic review tool, if applicable. No additional management support is needed unless otherwise documented below in the visit note. 

## 2015-11-19 NOTE — Progress Notes (Signed)
Subjective:   Patient ID: George Dy., male    DOB: 09-04-1940, 75 y.o.   MRN: [id number]  George Apostle. is a pleasant 75 y.o. year old male who presents to clinic today with Annual Exam  and follow up of chronic medical conditions on 11/19/2015  HPI:  I have personally reviewed the Medicare Annual Wellness questionnaire and have noted 1. The patient's medical and social history 2. Their use of alcohol, tobacco or illicit drugs 3. Their current medications and supplements 4. The patient's functional ability including ADL's, fall risks, home safety risks and hearing or visual             impairment. 5. Diet and physical activities 6. Evidence for depression or mood disorders  End of life wishes discussed and updated in Social History.  The roster of all physicians providing medical care to patient - is listed in the CareTeams section of the chart.   Colonoscopy 01/09/12 Td 11/16/11 Pneumovax 11/16/11 prevnar 13 11/11/14  Insomnia- sleeping better with trazodone.  Iron deficiency anemia- previous work up by hematology and GI. Was taking iron but caused constipation.  Denies blood in his stool or urine. Lab Results  Component Value Date   WBC 5.5 11/10/2015   HGB 12.5 (L) 11/10/2015   HCT 36.9 (L) 11/10/2015   MCV 84.5 11/10/2015   PLT 230.0 11/10/2015   Arthritis- back has been bothering him more.  Has not been as active. Knows he needs to stay active.  HTN- taking cozaar 25 mg daily and hctz 12.5 mg daily.  CKD- followed by renal.  Cr stable.  Lab Results  Component Value Date   CREATININE 1.51 (H) 11/10/2015   Lab Results  Component Value Date   CHOL 200 11/10/2015   HDL 49.70 11/10/2015   LDLCALC 129 (H) 11/10/2015   TRIG 108.0 11/10/2015   CHOLHDL 4 11/10/2015   Lab Results  Component Value Date   PSA 1.05 11/10/2015   PSA 1.05 11/04/2014   PSA 1.16 11/16/2011   Lab Results  Component Value Date   NA 136 11/10/2015   K 4.8 11/10/2015   CL 101  11/10/2015   CO2 28 11/10/2015   Lab Results  Component Value Date   ALT 12 11/10/2015   AST 19 11/10/2015   ALKPHOS 55 11/10/2015   BILITOT 0.4 11/10/2015    Current Outpatient Prescriptions on File Prior to Visit  Medication Sig Dispense Refill  . aspirin-acetaminophen-caffeine (EXCEDRIN MIGRAINE) 250-250-65 MG per tablet Take 1 tablet by mouth every 6 (six) hours as needed.    . cyanocobalamin 1000 MCG tablet Take 1,000 mcg by mouth 3 (three) times daily.     Marland Kitchen FERROUS GLUCONATE PO Take 1 tablet by mouth 3 (three) times daily. Take one by mouth daily    . fish oil-omega-3 fatty acids 1000 MG capsule Take 1 g by mouth daily.    . folic acid (FOLVITE) 1 MG tablet Take 1 mg by mouth 3 (three) times daily.    . hydrochlorothiazide (MICROZIDE) 12.5 MG capsule Take 1 capsule (12.5 mg total) by mouth daily. 30 capsule 5  . losartan (COZAAR) 25 MG tablet Take 1 tablet (25 mg total) by mouth daily. 30 tablet 6  . Multiple Vitamins-Minerals (CENTRUM SILVER PO) Take 1 tablet by mouth daily.    . vitamin C (ASCORBIC ACID) 500 MG tablet Take 500 mg by mouth 3 (three) times daily.    . vitamin E 400 UNIT capsule Take  400 Units by mouth daily.     No current facility-administered medications on file prior to visit.     Allergies  Allergen Reactions  . Lisinopril     cough    Past Medical History:  Diagnosis Date  . Arthritis   . HTN (hypertension)   . Narcotic dependence, in remission ([place])   . Retinal vein occlusion     Past Surgical History:  Procedure Laterality Date  . cyst chest  1994   left  . EYE SURGERY    . rectal fissure  1968  . vro      Family History  Problem Relation Age of Onset  . Hyperlipidemia Father   . Hypertension Father   . Heart disease Sister 80    MI, CABG  . Colon cancer Neg Hx   . Stomach cancer Neg Hx     Social History   Social History  . Marital status: Married    Spouse name: N/A  . Number of children: 2  . Years of education: N/A     Occupational History  . pharmasist [name]   Social History Main Topics  . Smoking status: Current Every Day Smoker    Packs/day: 0.50    Types: Cigarettes  . Smokeless tobacco: Never Used  . Alcohol use No  . Drug use: No  . Sexual activity: Not on file   Other Topics Concern  . 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      Does have a living will- does not want prolonged life support if futile.   The PMH, PSH, Social History, Family History, Medications, and allergies have been reviewed in [facility], and have been updated if relevant.  Review of Systems  Constitutional: Positive for fatigue. Negative for fever.  HENT: Negative.   Eyes: Negative.   Respiratory: Negative.   Cardiovascular: Negative.   Gastrointestinal: Negative.   Endocrine: Negative.   Genitourinary: Negative.   Musculoskeletal: Negative.   Skin: Negative.   Allergic/Immunologic: Negative.   Neurological: Negative.   Hematological: Negative.   Psychiatric/Behavioral: Negative.   All other systems reviewed and are negative.      Objective:    BP 116/68   Pulse 68   Temp 98.4 F (36.9 C) (Oral)   Ht 5' 7.75" (1.721 m)   Wt 152 lb 12 oz (69.3 kg)   SpO2 97%   BMI 23.40 kg/m   Wt Readings from Last 3 Encounters:  11/19/15 152 lb 12 oz (69.3 kg)  04/01/15 156 lb 8 oz (71 kg)  03/03/15 154 lb 8 oz (70.1 kg)    Physical Exam    General:  Pleasant male in NAD Eyes:  PERRL Ears:  External ear exam shows no significant lesions or deformities.  Otoscopic examination reveals clear canals, tympanic membranes are intact bilaterally without bulging, retraction, inflammation or discharge. Hearing is grossly normal bilaterally. Nose:  External nasal examination shows no deformity or inflammation. Nasal mucosa are pink and moist without lesions or exudates. Mouth:  Oral mucosa and oropharynx without lesions or exudates.   Teeth in good repair. Neck:  no carotid bruit or thyromegaly no cervical or supraclavicular lymphadenopathy  Lungs:  Normal respiratory effort, chest expands symmetrically. Lungs are clear to auscultation, no crackles or wheezes. Heart:  Normal rate and regular rhythm. S1 and S2 normal without gallop, murmur, click, rub or other extra sounds. Abdomen:  Bowel sounds positive,abdomen soft and non-tender without masses, organomegaly or hernias noted. Pulses:  R and L posterior tibial pulses are full and equal bilaterally  Extremities:  no edema      Assessment & Plan:   Visit for well man health check  Insomnia  Arthritis  Chronic kidney disease, stage 2 (mild)  Secondary hypertension, unspecified  Anemia, iron deficiency  Need for influenza vaccination - Plan: Flu Vaccine QUAD 36+ mos PF IM (Fluarix & Fluzone Quad PF) No Follow-up on file.

## 2015-11-19 NOTE — Assessment & Plan Note (Signed)
Well controlled. No changes made to rxs. 

## 2015-11-19 NOTE — Assessment & Plan Note (Signed)
The patients weight, height, BMI and visual acuity have been recorded in the chart.  Cognitive function assessed.   I have made referrals, counseling and provided education to the patient based review of the above and I have provided the pt with a written personalized care plan for preventive services.  

## 2015-11-19 NOTE — Assessment & Plan Note (Signed)
Improved with trazodone. No changes made.

## 2015-11-19 NOTE — Patient Instructions (Signed)
Great to see you. Please tell Baker Janus I said happy birthday!

## 2015-12-01 DIAGNOSIS — H34812 Central retinal vein occlusion, left eye, with macular edema: Secondary | ICD-10-CM | POA: Diagnosis not present

## 2016-02-09 DIAGNOSIS — H34812 Central retinal vein occlusion, left eye, with macular edema: Secondary | ICD-10-CM | POA: Diagnosis not present

## 2016-03-19 ENCOUNTER — Encounter: Payer: Self-pay | Admitting: Family Medicine

## 2016-03-21 ENCOUNTER — Other Ambulatory Visit: Payer: Self-pay | Admitting: Family Medicine

## 2016-03-21 MED ORDER — MELOXICAM 15 MG PO TABS: 15.0000 mg | ORAL_TABLET | Freq: Every day | ORAL | 3 refills | Status: DC

## 2016-03-22 ENCOUNTER — Encounter: Payer: Self-pay | Admitting: Family Medicine

## 2016-04-05 DIAGNOSIS — H35031 Hypertensive retinopathy, right eye: Secondary | ICD-10-CM | POA: Diagnosis not present

## 2016-04-05 DIAGNOSIS — H34812 Central retinal vein occlusion, left eye, with macular edema: Secondary | ICD-10-CM | POA: Diagnosis not present

## 2016-04-18 ENCOUNTER — Encounter: Payer: Self-pay | Admitting: Family Medicine

## 2016-04-19 MED ORDER — TRAZODONE HCL 50 MG PO TABS: 25.0000 mg | ORAL_TABLET | Freq: Every evening | ORAL | 1 refills | Status: DC | PRN

## 2016-04-21 ENCOUNTER — Encounter: Payer: Self-pay | Admitting: Family Medicine

## 2016-05-31 DIAGNOSIS — H35031 Hypertensive retinopathy, right eye: Secondary | ICD-10-CM | POA: Diagnosis not present

## 2016-05-31 DIAGNOSIS — H34812 Central retinal vein occlusion, left eye, with macular edema: Secondary | ICD-10-CM | POA: Diagnosis not present

## 2016-07-26 DIAGNOSIS — H35031 Hypertensive retinopathy, right eye: Secondary | ICD-10-CM | POA: Diagnosis not present

## 2016-07-26 DIAGNOSIS — H34812 Central retinal vein occlusion, left eye, with macular edema: Secondary | ICD-10-CM | POA: Diagnosis not present

## 2016-08-21 IMAGING — CR DG LUMBAR SPINE COMPLETE 4+V
5 series · 5 of 5 positions shown · non-contrast
Comparison: CT 02/27/2012 .

CLINICAL DATA: Back pain.

EXAM:
LUMBAR SPINE - COMPLETE 4+ VIEW

[view not recorded (1 of 5)]
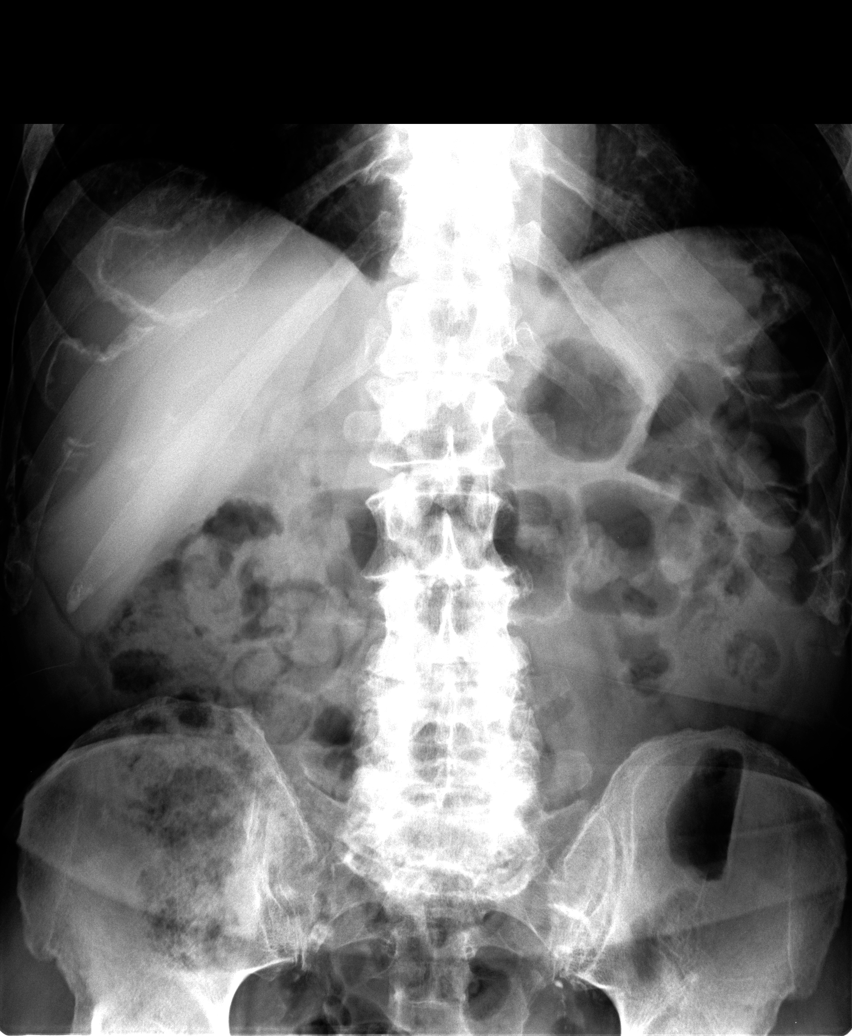

[view not recorded (2 of 5)]
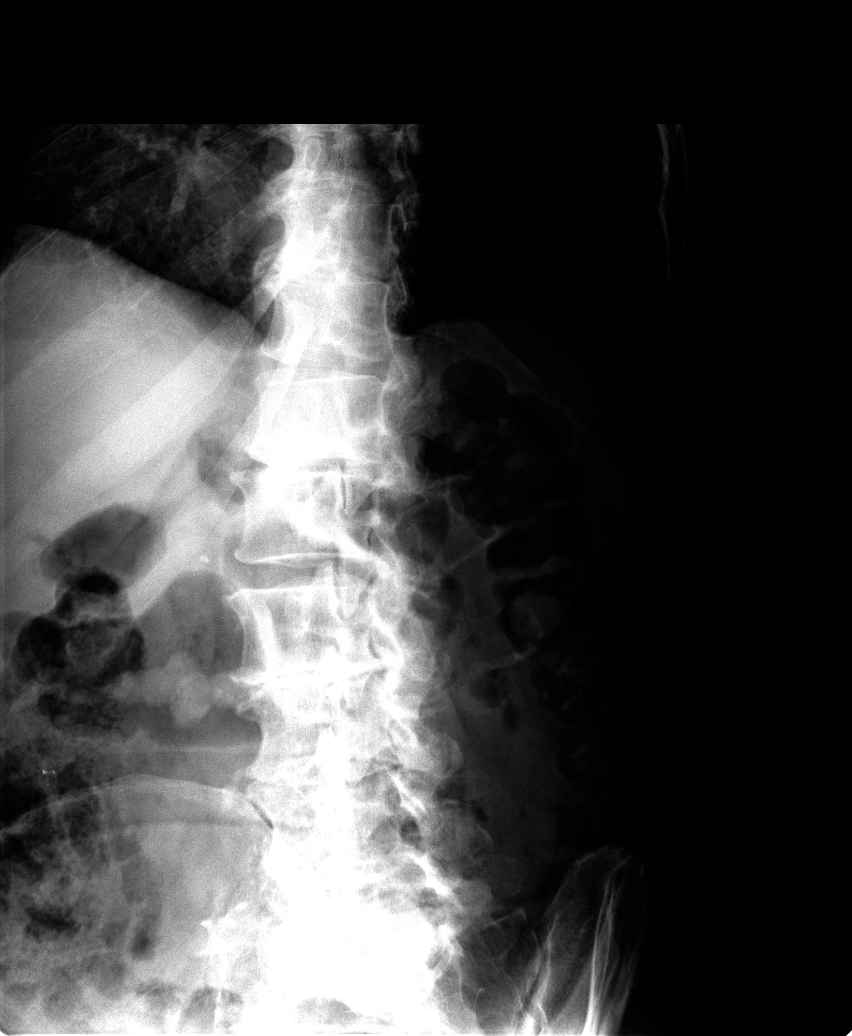

[view not recorded (3 of 5)]
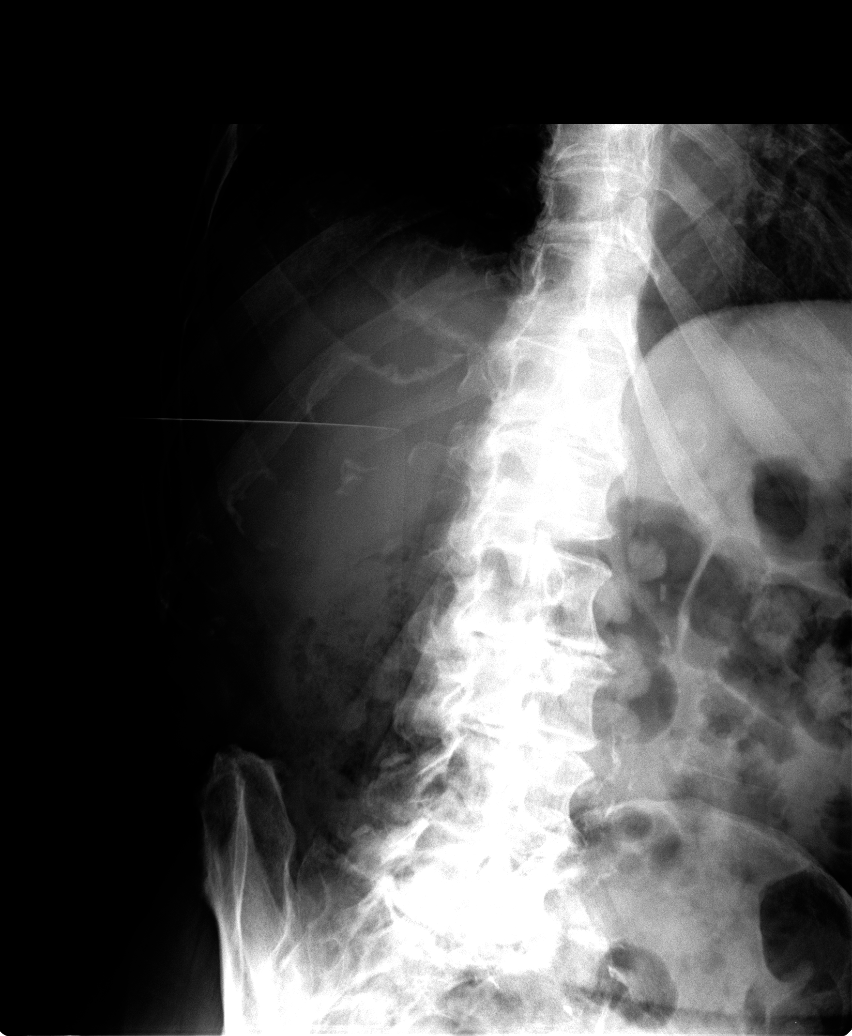

[view not recorded (4 of 5)]
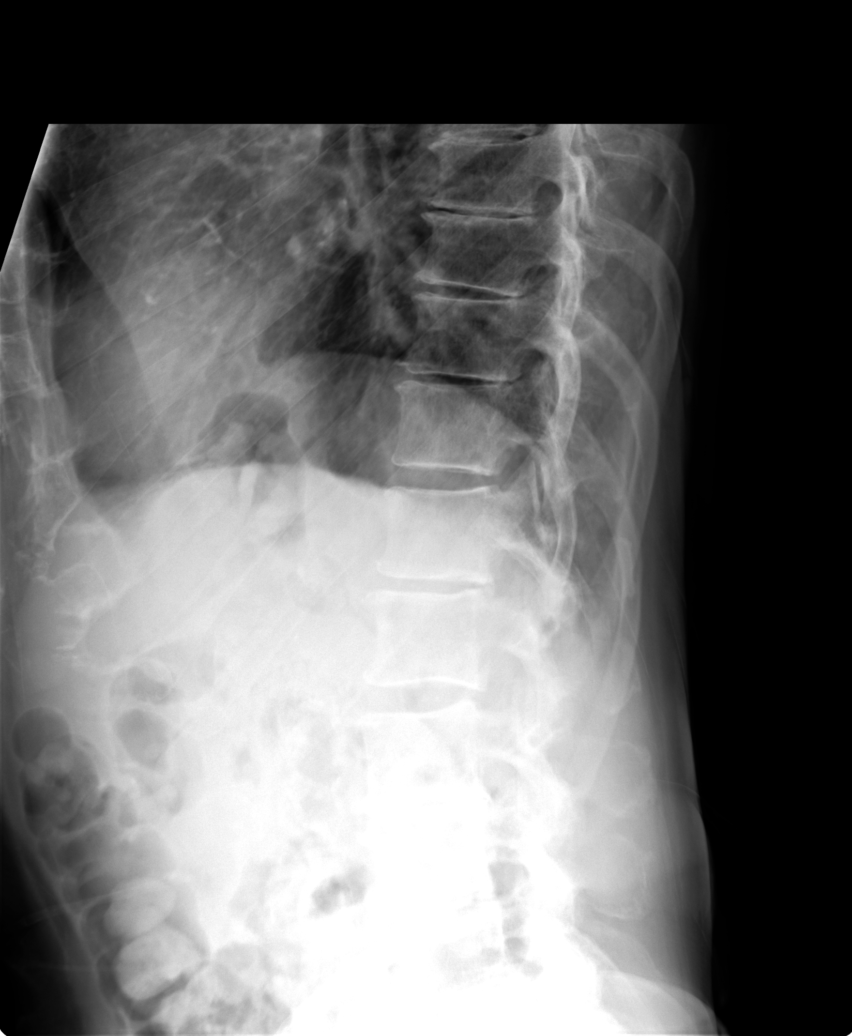

[view not recorded (5 of 5)]
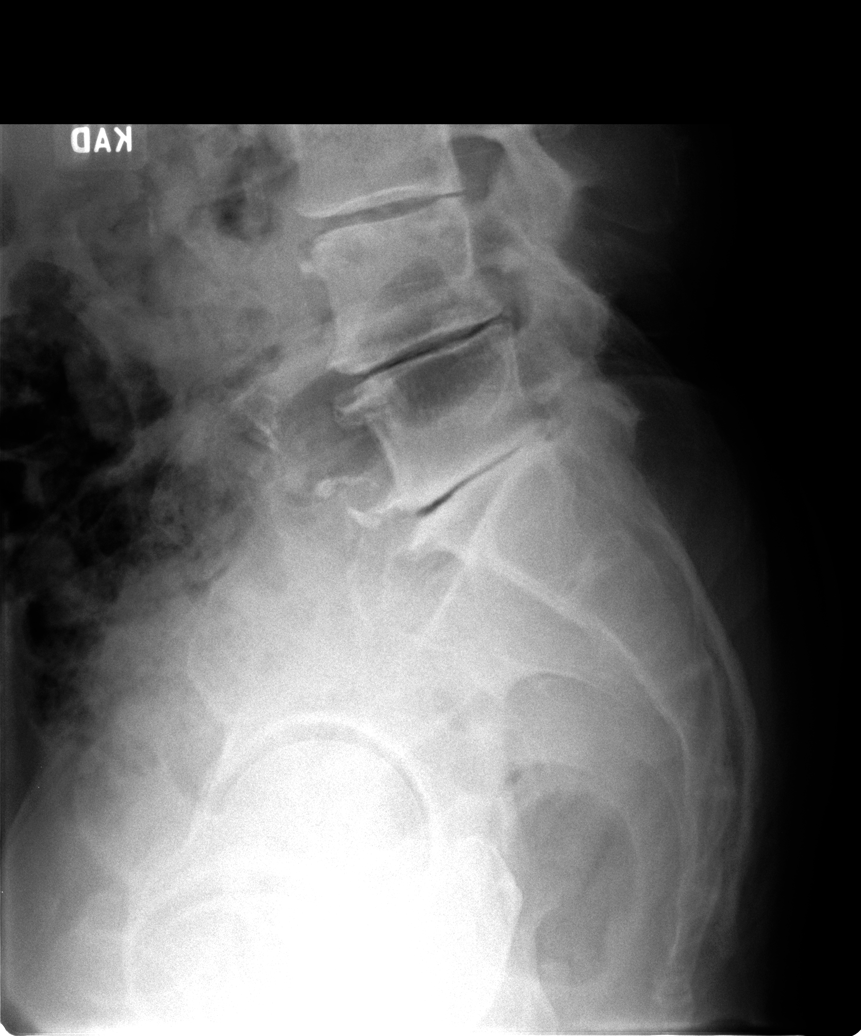

[5 of 5 positions shown; findings below may reference images not displayed]

FINDINGS: No acute bony abnormality identified. Multilevel severe degenerative
changes lumbar spine. Normal alignment mineralization. Aortoiliac
atherosclerotic vascular calcification
IMPRESSION: 1. Multilevel severe degenerative changes lumbar spine. No acute
abnormality.

2. Aortoiliac atherosclerotic vascular disease.

## 2016-08-22 ENCOUNTER — Encounter: Payer: Self-pay | Admitting: Family Medicine

## 2016-09-12 ENCOUNTER — Other Ambulatory Visit: Payer: Self-pay | Admitting: Family Medicine

## 2016-09-12 NOTE — Telephone Encounter (Signed)
Last refill 04/19/16 Last OV 11/19/15 Ok to refill?

## 2016-09-13 ENCOUNTER — Other Ambulatory Visit: Payer: Self-pay | Admitting: Family Medicine

## 2016-09-13 DIAGNOSIS — H35031 Hypertensive retinopathy, right eye: Secondary | ICD-10-CM | POA: Diagnosis not present

## 2016-09-13 DIAGNOSIS — H34812 Central retinal vein occlusion, left eye, with macular edema: Secondary | ICD-10-CM | POA: Diagnosis not present

## 2016-10-17 DIAGNOSIS — R800 Isolated proteinuria: Secondary | ICD-10-CM | POA: Diagnosis not present

## 2016-10-17 DIAGNOSIS — D631 Anemia in chronic kidney disease: Secondary | ICD-10-CM | POA: Diagnosis not present

## 2016-10-17 DIAGNOSIS — N183 Chronic kidney disease, stage 3 (moderate): Secondary | ICD-10-CM | POA: Diagnosis not present

## 2016-10-17 DIAGNOSIS — I10 Essential (primary) hypertension: Secondary | ICD-10-CM | POA: Diagnosis not present

## 2016-10-17 DIAGNOSIS — N2581 Secondary hyperparathyroidism of renal origin: Secondary | ICD-10-CM | POA: Diagnosis not present

## 2016-10-19 DIAGNOSIS — N183 Chronic kidney disease, stage 3 (moderate): Secondary | ICD-10-CM | POA: Diagnosis not present

## 2016-10-19 DIAGNOSIS — N2581 Secondary hyperparathyroidism of renal origin: Secondary | ICD-10-CM | POA: Diagnosis not present

## 2016-10-19 DIAGNOSIS — R809 Proteinuria, unspecified: Secondary | ICD-10-CM | POA: Diagnosis not present

## 2016-10-19 DIAGNOSIS — I129 Hypertensive chronic kidney disease with stage 1 through stage 4 chronic kidney disease, or unspecified chronic kidney disease: Secondary | ICD-10-CM | POA: Diagnosis not present

## 2016-11-08 DIAGNOSIS — H34812 Central retinal vein occlusion, left eye, with macular edema: Secondary | ICD-10-CM | POA: Diagnosis not present

## 2016-11-08 DIAGNOSIS — H35031 Hypertensive retinopathy, right eye: Secondary | ICD-10-CM | POA: Diagnosis not present

## 2016-11-08 DIAGNOSIS — Z961 Presence of intraocular lens: Secondary | ICD-10-CM | POA: Diagnosis not present

## 2016-11-10 ENCOUNTER — Encounter: Payer: Self-pay | Admitting: Family Medicine

## 2016-12-07 ENCOUNTER — Other Ambulatory Visit: Payer: Self-pay | Admitting: Family Medicine

## 2016-12-07 DIAGNOSIS — Z0000 Encounter for general adult medical examination without abnormal findings: Secondary | ICD-10-CM

## 2016-12-07 DIAGNOSIS — I159 Secondary hypertension, unspecified: Secondary | ICD-10-CM

## 2016-12-07 DIAGNOSIS — D509 Iron deficiency anemia, unspecified: Secondary | ICD-10-CM

## 2016-12-09 ENCOUNTER — Ambulatory Visit (INDEPENDENT_AMBULATORY_CARE_PROVIDER_SITE_OTHER): Payer: Medicare Other

## 2016-12-09 VITALS — BP 120/72 | HR 75 | Temp 98.4°F | Ht 67.5 in | Wt 145.2 lb

## 2016-12-09 DIAGNOSIS — D509 Iron deficiency anemia, unspecified: Secondary | ICD-10-CM | POA: Diagnosis not present

## 2016-12-09 DIAGNOSIS — Z23 Encounter for immunization: Secondary | ICD-10-CM

## 2016-12-09 DIAGNOSIS — Z0000 Encounter for general adult medical examination without abnormal findings: Secondary | ICD-10-CM | POA: Diagnosis not present

## 2016-12-09 LAB — CBC WITH DIFFERENTIAL/PLATELET
BASOS PCT: 0.7 % (ref 0.0–3.0)
Basophils Absolute: 0 10*3/uL (ref 0.0–0.1)
EOS PCT: 1.7 % (ref 0.0–5.0)
Eosinophils Absolute: 0.1 10*3/uL (ref 0.0–0.7)
HEMATOCRIT: 33 % — AB (ref 39.0–52.0)
Hemoglobin: 10.7 g/dL — ABNORMAL LOW (ref 13.0–17.0)
LYMPHS PCT: 15.6 % (ref 12.0–46.0)
Lymphs Abs: 0.9 10*3/uL (ref 0.7–4.0)
MCHC: 32.3 g/dL (ref 30.0–36.0)
MCV: 82.7 fl (ref 78.0–100.0)
MONOS PCT: 7.8 % (ref 3.0–12.0)
Monocytes Absolute: 0.5 10*3/uL (ref 0.1–1.0)
Neutro Abs: 4.5 10*3/uL (ref 1.4–7.7)
Neutrophils Relative %: 74.2 % (ref 43.0–77.0)
Platelets: 249 10*3/uL (ref 150.0–400.0)
RBC: 3.99 Mil/uL — AB (ref 4.22–5.81)
RDW: 17.3 % — AB (ref 11.5–15.5)
WBC: 6 10*3/uL (ref 4.0–10.5)

## 2016-12-09 LAB — COMPREHENSIVE METABOLIC PANEL
ALBUMIN: 4.2 g/dL (ref 3.5–5.2)
ALK PHOS: 43 U/L (ref 39–117)
ALT: 10 U/L (ref 0–53)
AST: 17 U/L (ref 0–37)
BUN: 22 mg/dL (ref 6–23)
CALCIUM: 10 mg/dL (ref 8.4–10.5)
CHLORIDE: 99 meq/L (ref 96–112)
CO2: 28 mEq/L (ref 19–32)
Creatinine, Ser: 1.39 mg/dL (ref 0.40–1.50)
GFR: 52.82 mL/min — ABNORMAL LOW (ref >60.00)
GLUCOSE: 100 mg/dL — AB (ref 70–99)
POTASSIUM: 4.9 meq/L (ref 3.5–5.1)
Sodium: 134 mEq/L — ABNORMAL LOW (ref 135–145)
TOTAL PROTEIN: 6.7 g/dL (ref 6.0–8.3)
Total Bilirubin: 0.4 mg/dL (ref 0.2–1.2)

## 2016-12-09 LAB — PSA: PSA: 0.88 ng/mL (ref 0.10–4.00)

## 2016-12-09 LAB — LIPID PANEL
Cholesterol: 204 mg/dL — ABNORMAL HIGH (ref 0–200)
HDL: 67.3 mg/dL (ref >39.00)
LDL Cholesterol: 119 mg/dL — ABNORMAL HIGH (ref 0–99)
NONHDL: 137.07
Total CHOL/HDL Ratio: 3
Triglycerides: 91 mg/dL (ref 0.0–149.0)
VLDL: 18.2 mg/dL (ref 0.0–40.0)

## 2016-12-09 NOTE — Patient Instructions (Addendum)
George Washington , Thank you for taking time to come for your Medicare Wellness Visit. I appreciate your ongoing commitment to your health goals. Please review the following plan we discussed and let me know if I can assist you in the future.   These are the goals we discussed: Goals    . Increase water intake          Starting 12/09/2016, I will continue to drink at least 6-8 glasses of water daily.       This is a list of the screening recommended for you and due dates:  Health Maintenance  Topic Date Due  . Tetanus Vaccine  11/15/2021  . Colon Cancer Screening  01/08/2022  . Flu Shot  Completed  . Pneumonia vaccines  Completed   Preventive Care for Adults  A healthy lifestyle and preventive care can promote health and wellness. Preventive health guidelines for adults include the following key practices.  . A routine yearly physical is a good way to check with your health care provider about your health and preventive screening. It is a chance to share any concerns and updates on your health and to receive a thorough exam.  . Visit your dentist for a routine exam and preventive care every 6 months. Brush your teeth twice a day and floss once a day. Good oral hygiene prevents tooth decay and gum disease.  . The frequency of eye exams is based on your age, health, family medical history, use  of contact lenses, and other factors. Follow your health care provider's ecommendations for frequency of eye exams.  . Eat a healthy diet. Foods like vegetables, fruits, whole grains, low-fat dairy products, and lean protein foods contain the nutrients you need without too many calories. Decrease your intake of foods high in solid fats, added sugars, and salt. Eat the right amount of calories for you. Get information about a proper diet from your health care provider, if necessary.  . Regular physical exercise is one of the most important things you can do for your health. Most adults should get at least  150 minutes of moderate-intensity exercise (any activity that increases your heart rate and causes you to sweat) each week. In addition, most adults need muscle-strengthening exercises on 2 or more days a week.  Silver Sneakers may be a benefit available to you. To determine eligibility, you may visit the website: www.silversneakers.com or contact program at (202)832-0267 Mon-Fri between 8AM-8PM.   . Maintain a healthy weight. The body mass index (BMI) is a screening tool to identify possible weight problems. It provides an estimate of body fat based on height and weight. Your health care provider can find your BMI and can help you achieve or maintain a healthy weight.   For adults 20 years and older: ? A BMI below 18.5 is considered underweight. ? A BMI of 18.5 to 24.9 is normal. ? A BMI of 25 to 29.9 is considered overweight. ? A BMI of 30 and above is considered obese.   . Maintain normal blood lipids and cholesterol levels by exercising and minimizing your intake of saturated fat. Eat a balanced diet with plenty of fruit and vegetables. Blood tests for lipids and cholesterol should begin at age 48 and be repeated every 5 years. If your lipid or cholesterol levels are high, you are over 50, or you are at high risk for heart disease, you may need your cholesterol levels checked more frequently. Ongoing high lipid and cholesterol levels should be  treated with medicines if diet and exercise are not working.  . If you smoke, find out from your health care provider how to quit. If you do not use tobacco, please do not start.  . If you choose to drink alcohol, please do not consume more than 2 drinks per day. One drink is considered to be 12 ounces (355 mL) of beer, 5 ounces (148 mL) of wine, or 1.5 ounces (44 mL) of liquor.  . If you are 23-47 years old, ask your health care provider if you should take aspirin to prevent strokes.  . Use sunscreen. Apply sunscreen liberally and repeatedly  throughout the day. You should seek shade when your shadow is shorter than you. Protect yourself by wearing long sleeves, pants, a wide-brimmed hat, and sunglasses year round, whenever you are outdoors.  . Once a month, do a whole body skin exam, using a mirror to look at the skin on your back. Tell your health care provider of new moles, moles that have irregular borders, moles that are larger than a pencil eraser, or moles that have changed in shape or color.

## 2016-12-09 NOTE — Progress Notes (Signed)
PCP notes:   Health maintenance:  Flu vaccine - administered  Abnormal screenings:   None  Patient concerns:   None  Nurse concerns:  None  Next PCP appt:   12/13/16 @ 1215

## 2016-12-09 NOTE — Progress Notes (Signed)
Pre visit review using our clinic review tool, if applicable. No additional management support is needed unless otherwise documented below in the visit note. 

## 2016-12-09 NOTE — Progress Notes (Signed)
I reviewed health advisor's note, was available for consultation, and agree with documentation and plan.  

## 2016-12-09 NOTE — Progress Notes (Signed)
Subjective:   George Washington. is a 76 y.o. male who presents for Medicare Annual/Subsequent preventive examination.  Review of Systems:  N/A Cardiac Risk Factors include: advanced age (>11men, >21 women);male gender;hypertension     Objective:    Vitals: BP 120/72 (BP Location: Right Arm, Patient Position: Sitting, Cuff Size: Normal)   Pulse 75   Temp 98.4 F (36.9 C) (Oral)   Ht 5' 7.5" (1.715 m) Comment: no shoes  Wt 145 lb 4 oz (65.9 kg)   SpO2 98%   BMI 22.41 kg/m   Body mass index is 22.41 kg/m.  Tobacco History  Smoking Status  . Current Every Day Smoker  . Packs/day: 0.50  . Types: Cigarettes  Smokeless Tobacco  . Never Used     Ready to quit: No Counseling given: No   Past Medical History:  Diagnosis Date  . Arthritis   . HTN (hypertension)   . Narcotic dependence, in remission ([place])   . Retinal vein occlusion    Past Surgical History:  Procedure Laterality Date  . cyst chest  1994   left  . EYE SURGERY    . rectal fissure  1968  . vro     Family History  Problem Relation Age of Onset  . Hyperlipidemia Father   . Hypertension Father   . Heart disease Sister 44       MI, CABG  . Colon cancer Neg Hx   . Stomach cancer Neg Hx    History  Sexual Activity  . Sexual activity: Not on file    Outpatient Encounter Prescriptions as of 12/09/2016  Medication Sig  . aspirin-acetaminophen-caffeine (EXCEDRIN MIGRAINE) 250-250-65 MG per tablet Take 1 tablet by mouth every 6 (six) hours as needed.  . Cholecalciferol (VITAMIN D3 PO) Take 2,000 Units by mouth daily.  . Cyanocobalamin (B-12 PO) Take 6,000 mcg/mL by mouth daily.  . hydrochlorothiazide (MICROZIDE) 12.5 MG capsule Take 1 capsule (12.5 mg total) by mouth daily.  . IRON PO Take 10 drops by mouth daily.  Marland Kitchen losartan (COZAAR) 25 MG tablet Take 1 tablet (25 mg total) by mouth daily.  . meloxicam (MOBIC) 15 MG tablet Take 1 tablet (15 mg total) by mouth daily.  . Multiple Vitamins-Minerals  (CENTRUM SILVER PO) Take 1 tablet by mouth daily.  . traZODone (DESYREL) 50 MG tablet TAKE ONE-HALF TO ONE TABLET BY MOUTH AT BEDTIME AS NEEDED FOR SLEEP  . vitamin E 400 UNIT capsule Take 400 Units by mouth daily.  . [DISCONTINUED] cyanocobalamin 1000 MCG tablet Take 1,000 mcg by mouth 3 (three) times daily.   . [DISCONTINUED] FERROUS GLUCONATE PO Take 1 tablet by mouth 3 (three) times daily. Take one by mouth daily  . [DISCONTINUED] fish oil-omega-3 fatty acids 1000 MG capsule Take 1 g by mouth daily.  . [DISCONTINUED] folic acid (FOLVITE) 1 MG tablet Take 1 mg by mouth 3 (three) times daily.  . [DISCONTINUED] vitamin C (ASCORBIC ACID) 500 MG tablet Take 500 mg by mouth 3 (three) times daily.   No facility-administered encounter medications on file as of 12/09/2016.     Activities of Daily Living In your present state of health, do you have any difficulty performing the following activities: 12/09/2016  Hearing? N  Vision? N  Difficulty concentrating or making decisions? N  Walking or climbing stairs? N  Dressing or bathing? N  Doing errands, shopping? N  Preparing Food and eating ? N  Using the Toilet? N  In the past six  months, have you accidently leaked urine? N  Do you have problems with loss of bowel control? N  Managing your Medications? N  Managing your Finances? N  Housekeeping or managing your Housekeeping? N  Some recent data might be hidden    Patient Care Team: Lucille Passy, MD as PCP - General (Family Medicine) Milus Banister, MD as Attending Physician (Gastroenterology)   Assessment:     Hearing Screening   125Hz  250Hz  500Hz  1000Hz  2000Hz  3000Hz  4000Hz  6000Hz  8000Hz   Right ear:   40 40 40  40    Left ear:   40 40 40  40    Vision Screening Comments: Last vision exam in Sept 0218 with Dr. Lavella Lemons   Exercise Activities and Dietary recommendations Current Exercise Habits: Home exercise routine, Type of exercise: walking;Other - see comments (yard work), Time  (Minutes): 30, Frequency (Times/Week): 7, Weekly Exercise (Minutes/Week): 210, Intensity: Moderate, Exercise limited by: None identified  Goals    . Increase water intake          Starting 12/09/2016, I will continue to drink at least 6-8 glasses of water daily.      Fall Risk Fall Risk  12/09/2016 11/19/2015 11/11/2014  Falls in the past year? No No No   Depression Screen PHQ 2/9 Scores 12/09/2016 11/19/2015 11/11/2014 11/16/2011  PHQ - 2 Score 0 0 0 0  PHQ- 9 Score 0 - - -    Cognitive Function MMSE - Mini Mental State Exam 12/09/2016  Orientation to time 5  Orientation to Place 5  Registration 3  Attention/ Calculation 0  Recall 3  Language- name 2 objects 0  Language- repeat 1  Language- follow 3 step command 3  Language- read & follow direction 0  Write a sentence 0  Copy design 0  Total score 20     PLEASE NOTE: A Mini-Cog screen was completed. Maximum score is 20. A value of 0 denotes this part of Folstein MMSE was not completed or the patient failed this part of the Mini-Cog screening.   Mini-Cog Screening Orientation to Time - Max 5 pts Orientation to Place - Max 5 pts Registration - Max 3 pts Recall - Max 3 pts Language Repeat - Max 1 pts Language Follow 3 Step Command - Max 3 pts     Immunization History  Administered Date(s) Administered  . Influenza,inj,Quad PF,6+ Mos 11/11/2014, 11/19/2015  . Pneumococcal Conjugate-13 11/11/2014  . Pneumococcal Polysaccharide-23 11/16/2011   Screening Tests Health Maintenance  Topic Date Due  . TETANUS/TDAP  11/15/2021  . COLONOSCOPY  01/08/2022  . INFLUENZA VACCINE  Completed  . PNA vac Low Risk Adult  Completed      Plan:     I have personally reviewed and addressed the Medicare Annual Wellness questionnaire and have noted the following in the patient's chart:  A. Medical and social history B. Use of alcohol, tobacco or illicit drugs  C. Current medications and supplements D. Functional ability and status E.    Nutritional status F.  Physical activity G. Advance directives H. List of other physicians I.  Hospitalizations, surgeries, and ER visits in previous 12 months J.  [place] to include hearing, vision, cognitive, depression L. Referrals and appointments - none  In addition, I have reviewed and discussed with patient certain preventive protocols, quality metrics, and best practice recommendations. A written personalized care plan for preventive services as well as general preventive health recommendations were provided to patient.  See attached scanned  questionnaire for additional information.   Signed,   Lindell Noe, MHA, BS, LPN Health Coach

## 2016-12-13 ENCOUNTER — Ambulatory Visit (INDEPENDENT_AMBULATORY_CARE_PROVIDER_SITE_OTHER): Payer: Medicare Other | Admitting: Family Medicine

## 2016-12-13 ENCOUNTER — Encounter: Payer: Self-pay | Admitting: Family Medicine

## 2016-12-13 VITALS — BP 144/64 | HR 71 | Temp 98.6°F | Ht 67.5 in | Wt 145.0 lb

## 2016-12-13 DIAGNOSIS — D509 Iron deficiency anemia, unspecified: Secondary | ICD-10-CM | POA: Diagnosis not present

## 2016-12-13 DIAGNOSIS — N182 Chronic kidney disease, stage 2 (mild): Secondary | ICD-10-CM | POA: Diagnosis not present

## 2016-12-13 DIAGNOSIS — I10 Essential (primary) hypertension: Secondary | ICD-10-CM

## 2016-12-13 DIAGNOSIS — Z0000 Encounter for general adult medical examination without abnormal findings: Secondary | ICD-10-CM

## 2016-12-13 DIAGNOSIS — G4700 Insomnia, unspecified: Secondary | ICD-10-CM | POA: Diagnosis not present

## 2016-12-13 NOTE — Progress Notes (Addendum)
Subjective:   Patient ID: George Dy., male    DOB: June 07, 1940, 76 y.o.   MRN: [id number]  George Schranz. is a pleasant 76 y.o. year old male who presents to clinic today with Annual Exam  and follow up of chronic medical conditions on 12/13/2016  HPI:  Annual wellness visit with Candis Musa, RN on 12/09/16.  Note reviewed.  Health Maintenance  Topic Date Due  . TETANUS/TDAP  11/15/2021  . COLONOSCOPY  01/08/2022  . INFLUENZA VACCINE  Completed  . PNA vac Low Risk Adult  Completed    Insomnia- sleeping better with trazodone.  Iron deficiency anemia- previous work up by hematology and GI. Was taking iron but caused constipation.  Denies blood in his stool or urine. Lab Results  Component Value Date   WBC 6.0 12/09/2016   HGB 10.7 (L) 12/09/2016   HCT 33.0 (L) 12/09/2016   MCV 82.7 12/09/2016   PLT 249.0 12/09/2016    HTN- taking cozaar 25 mg daily and hctz 12.5 mg daily.  CKD- followed by renal.  Cr stable.  Lab Results  Component Value Date   CREATININE 1.39 12/09/2016   Lab Results  Component Value Date   CHOL 204 (H) 12/09/2016   HDL 67.30 12/09/2016   LDLCALC 119 (H) 12/09/2016   TRIG 91.0 12/09/2016   CHOLHDL 3 12/09/2016   Lab Results  Component Value Date   PSA 0.88 12/09/2016   PSA 1.05 11/10/2015   PSA 1.05 11/04/2014   Lab Results  Component Value Date   NA 134 (L) 12/09/2016   K 4.9 12/09/2016   CL 99 12/09/2016   CO2 28 12/09/2016   Lab Results  Component Value Date   ALT 10 12/09/2016   AST 17 12/09/2016   ALKPHOS 43 12/09/2016   BILITOT 0.4 12/09/2016    Current Outpatient Prescriptions on File Prior to Visit  Medication Sig Dispense Refill  . aspirin-acetaminophen-caffeine (EXCEDRIN MIGRAINE) 250-250-65 MG per tablet Take 1 tablet by mouth every 6 (six) hours as needed.    . Cholecalciferol (VITAMIN D3 PO) Take 2,000 Units by mouth daily.    . Cyanocobalamin (B-12 PO) Take 6,000 mcg/mL by mouth daily.    .  hydrochlorothiazide (MICROZIDE) 12.5 MG capsule Take 1 capsule (12.5 mg total) by mouth daily. 30 capsule 5  . IRON PO Take 10 drops by mouth daily.    Marland Kitchen losartan (COZAAR) 25 MG tablet Take 1 tablet (25 mg total) by mouth daily. 30 tablet 6  . meloxicam (MOBIC) 15 MG tablet Take 1 tablet (15 mg total) by mouth daily. 90 tablet 3  . Multiple Vitamins-Minerals (CENTRUM SILVER PO) Take 1 tablet by mouth daily.    . traZODone (DESYREL) 50 MG tablet TAKE ONE-HALF TO ONE TABLET BY MOUTH AT BEDTIME AS NEEDED FOR SLEEP 90 tablet 0  . vitamin E 400 UNIT capsule Take 400 Units by mouth daily.     No current facility-administered medications on file prior to visit.     Allergies  Allergen Reactions  . Lisinopril     cough    Past Medical History:  Diagnosis Date  . Arthritis   . HTN (hypertension)   . Narcotic dependence, in remission ([place])   . Retinal vein occlusion     Past Surgical History:  Procedure Laterality Date  . cyst chest  1994   left  . EYE SURGERY    . rectal fissure  1968  . vro  Family History  Problem Relation Age of Onset  . Hyperlipidemia Father   . Hypertension Father   . Heart disease Sister 29       MI, CABG  . Colon cancer Neg Hx   . Stomach cancer Neg Hx     Social History   Social History  . Marital status: Married    Spouse name: N/A  . Number of children: 2  . Years of education: N/A   Occupational History  . pharmasist [name]   Social History Main Topics  . Smoking status: Current Every Day Smoker    Packs/day: 0.50    Types: Cigarettes  . Smokeless tobacco: Never Used  . Alcohol use 0.0 oz/week     Comment: beer or wine occassionally  . Drug use: No  . Sexual activity: Not on file   Other Topics Concern  . 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      Does have a living will- does not want prolonged life support if futile.   The PMH, PSH,  Social History, Family History, Medications, and allergies have been reviewed in [facility], and have been updated if relevant.  Review of Systems  Constitutional: Negative for fatigue and fever.  HENT: Negative.   Eyes: Negative.   Respiratory: Negative.   Cardiovascular: Negative.   Gastrointestinal: Negative.   Endocrine: Negative.   Genitourinary: Negative.   Musculoskeletal: Negative.   Skin: Negative.   Allergic/Immunologic: Negative.   Neurological: Negative.   Hematological: Negative.   Psychiatric/Behavioral: Negative.   All other systems reviewed and are negative.      Objective:    BP (!) 144/64 (BP Location: Right Arm, Patient Position: Sitting, Cuff Size: Normal)   Pulse 71   Temp 98.6 F (37 C) (Oral)   Ht 5' 7.5" (1.715 m)   Wt 145 lb (65.8 kg)   SpO2 97%   BMI 22.38 kg/m   BP Readings from Last 3 Encounters:  12/13/16 (!) 144/64  12/09/16 120/72  11/19/15 116/68    Wt Readings from Last 3 Encounters:  12/13/16 145 lb (65.8 kg)  12/09/16 145 lb 4 oz (65.9 kg)  11/19/15 152 lb 12 oz (69.3 kg)    Physical Exam   General:  pleasant male in no acute distress Eyes:  PERRL Ears:  External ear exam shows no significant lesions or deformities.  TMs normal bilaterally Hearing is grossly normal bilaterally. Nose:  External nasal examination shows no deformity or inflammation. Nasal mucosa are pink and moist without lesions or exudates. Mouth:  Oral mucosa and oropharynx without lesions or exudates.  Teeth in good repair. Neck:  no carotid bruit or thyromegaly no cervical or supraclavicular lymphadenopathy  Lungs:  Normal respiratory effort, chest expands symmetrically. Lungs are clear to auscultation, no crackles or wheezes. Heart:  Normal rate and regular rhythm. S1 and S2 normal without gallop, murmur, click, rub or other extra sounds. Abdomen:  Bowel sounds positive,abdomen soft and non-tender without masses, organomegaly or hernias noted. Pulses:  R and L  posterior tibial pulses are full and equal bilaterally  Extremities:  no edema  Psych:  Good eye contact, not anxious or depressed appearing     Assessment & Plan:   Visit for well man health check  Stage 2 chronic kidney disease  Hypertension, unspecified type  Insomnia, unspecified type  Iron deficiency anemia, unspecified iron deficiency anemia type  No Follow-up on file.

## 2016-12-13 NOTE — Assessment & Plan Note (Signed)
Reviewed preventive care protocols, scheduled due services, and updated immunizations Discussed nutrition, exercise, diet, and healthy lifestyle.  

## 2016-12-13 NOTE — Assessment & Plan Note (Signed)
Stable

## 2016-12-13 NOTE — Assessment & Plan Note (Signed)
Has been well controlled.  A little elevated today- rushed to get here.

## 2016-12-15 ENCOUNTER — Encounter: Payer: Self-pay | Admitting: Family Medicine

## 2016-12-16 ENCOUNTER — Other Ambulatory Visit: Payer: Self-pay

## 2016-12-16 MED ORDER — TRAZODONE HCL 50 MG PO TABS: ORAL_TABLET | ORAL | 0 refills | Status: DC

## 2016-12-16 MED ORDER — MELOXICAM 15 MG PO TABS
15.0000 mg | ORAL_TABLET | Freq: Every day | ORAL | 0 refills | Status: DC
Start: 2016-12-16 — End: 2017-01-16

## 2016-12-16 NOTE — Telephone Encounter (Signed)
Last refill MELOXICAM 03/21/16 #90+3 TRAZODONE 09/12/16 #90 Last OV 12/13/16 Ok to refill?

## 2016-12-20 DIAGNOSIS — H34812 Central retinal vein occlusion, left eye, with macular edema: Secondary | ICD-10-CM | POA: Diagnosis not present

## 2016-12-20 DIAGNOSIS — H35031 Hypertensive retinopathy, right eye: Secondary | ICD-10-CM | POA: Diagnosis not present

## 2016-12-20 DIAGNOSIS — Z961 Presence of intraocular lens: Secondary | ICD-10-CM | POA: Diagnosis not present

## 2016-12-28 ENCOUNTER — Encounter: Payer: Self-pay | Admitting: Family Medicine

## 2017-01-12 ENCOUNTER — Other Ambulatory Visit: Payer: Self-pay | Admitting: Family Medicine

## 2017-01-16 ENCOUNTER — Other Ambulatory Visit: Payer: Self-pay | Admitting: Family Medicine

## 2017-01-17 NOTE — Telephone Encounter (Signed)
On 10.05.10 #90 was faxed/thx dmf

## 2017-01-18 MED ORDER — MELOXICAM 15 MG PO TABS: 15.0000 mg | ORAL_TABLET | Freq: Every day | ORAL | 0 refills | Status: DC

## 2017-01-18 MED ORDER — TRAZODONE HCL 50 MG PO TABS: ORAL_TABLET | ORAL | 0 refills | Status: DC

## 2017-01-31 DIAGNOSIS — H35031 Hypertensive retinopathy, right eye: Secondary | ICD-10-CM | POA: Diagnosis not present

## 2017-01-31 DIAGNOSIS — H34812 Central retinal vein occlusion, left eye, with macular edema: Secondary | ICD-10-CM | POA: Diagnosis not present

## 2017-01-31 DIAGNOSIS — H3562 Retinal hemorrhage, left eye: Secondary | ICD-10-CM | POA: Diagnosis not present

## 2017-03-21 DIAGNOSIS — H34812 Central retinal vein occlusion, left eye, with macular edema: Secondary | ICD-10-CM | POA: Diagnosis not present

## 2017-03-21 DIAGNOSIS — H35031 Hypertensive retinopathy, right eye: Secondary | ICD-10-CM | POA: Diagnosis not present

## 2017-04-26 DIAGNOSIS — N2581 Secondary hyperparathyroidism of renal origin: Secondary | ICD-10-CM | POA: Diagnosis not present

## 2017-04-26 DIAGNOSIS — I10 Essential (primary) hypertension: Secondary | ICD-10-CM | POA: Diagnosis not present

## 2017-04-26 DIAGNOSIS — F17200 Nicotine dependence, unspecified, uncomplicated: Secondary | ICD-10-CM | POA: Diagnosis not present

## 2017-04-26 DIAGNOSIS — N183 Chronic kidney disease, stage 3 (moderate): Secondary | ICD-10-CM | POA: Diagnosis not present

## 2017-04-26 DIAGNOSIS — Z720 Tobacco use: Secondary | ICD-10-CM | POA: Diagnosis not present

## 2017-06-08 ENCOUNTER — Encounter: Payer: Self-pay | Admitting: Family Medicine

## 2017-06-08 ENCOUNTER — Other Ambulatory Visit: Payer: Self-pay | Admitting: Family Medicine

## 2017-06-08 ENCOUNTER — Ambulatory Visit: Payer: Self-pay | Admitting: *Deleted

## 2017-06-08 ENCOUNTER — Ambulatory Visit: Payer: Medicare Other | Admitting: Family Medicine

## 2017-06-08 DIAGNOSIS — Z0289 Encounter for other administrative examinations: Secondary | ICD-10-CM

## 2017-06-08 NOTE — Telephone Encounter (Signed)
Sent to our office by mistake

## 2017-06-08 NOTE — Telephone Encounter (Addendum)
Pt called with complaints of back pain; he is also having spasms, and feels like his hips are locked and he has a shuffling gait; recommendations made per protocol to include seeing a physician within 4 hours; pt normally sees Dr Deborra Medina but she has no availability (he was offered an 1100 appointment but is unable to get to that appointment because of the distance); pt offered and accepted appointment with Dr Clearance Coots, [facility] today at 1540; he also scheduled an appointment with Dr Deborra Medina on 06/14/17 at 1245; pt verbalizes understanding; will route to both offices for notification of upcoming appointments.  Reason for Disposition . [1] SEVERE back pain (e.g., excruciating, unable to do any normal activities) AND [2] not improved 2 hours after pain medicine  Answer Assessment - Initial Assessment Questions 1. ONSET: "When did the pain begin?"      3 days ago 2. LOCATION: "Where does it hurt?" (upper, mid or lower back)     Lower back L4-L5 3. SEVERITY: "How bad is the pain?"  (e.g., Scale 1-10; mild, moderate, or severe)   - MILD (1-3): doesn't interfere with normal activities    - MODERATE (4-7): interferes with normal activities or awakens from sleep    - SEVERE (8-10): excruciating pain, unable to do any normal activities      severe 4. PATTERN: "Is the pain constant?" (e.g., yes, no; constant, intermittent)     constant 5. RADIATION: "Does the pain shoot into your legs or elsewhere?"     Hips will lock 6. CAUSE:  "What do you think is causing the back pain?"      Prior back problems 7. BACK OVERUSE:  "Any recent lifting of heavy objects, strenuous work or exercise?"     no 8. MEDICATIONS: "What have you taken so far for the pain?" (e.g., nothing, acetaminophen, NSAIDS)     Execedrin, heating pad, laying flat 9. NEUROLOGIC SYMPTOMS: "Do you have any weakness, numbness, or problems with bowel/bladder control?"     no 10. OTHER SYMPTOMS: "Do you have any other symptoms?" (e.g., fever,  abdominal pain, burning with urination, blood in urine)       no 11. PREGNANCY: "Is there any chance you are pregnant?" (e.g., yes, no; LMP)       n/a  Protocols used: BACK PAIN-A-AH

## 2017-06-09 MED ORDER — MELOXICAM 15 MG PO TABS: 15.0000 mg | ORAL_TABLET | Freq: Every day | ORAL | 0 refills | Status: DC

## 2017-06-09 MED ORDER — TRAZODONE HCL 50 MG PO TABS: ORAL_TABLET | ORAL | 0 refills | Status: DC

## 2017-06-13 ENCOUNTER — Encounter: Payer: Self-pay | Admitting: Family Medicine

## 2017-06-13 ENCOUNTER — Telehealth: Payer: Self-pay | Admitting: Family Medicine

## 2017-06-13 MED ORDER — TRAMADOL HCL 50 MG PO TABS: 50.0000 mg | ORAL_TABLET | Freq: Three times a day (TID) | ORAL | 0 refills | Status: DC | PRN

## 2017-06-13 MED ORDER — CARISOPRODOL 350 MG PO TABS: 350.0000 mg | ORAL_TABLET | Freq: Three times a day (TID) | ORAL | 0 refills | Status: DC | PRN

## 2017-06-13 NOTE — Telephone Encounter (Signed)
Okay to refill as requested.

## 2017-06-13 NOTE — Telephone Encounter (Signed)
TA-Plz see pt req for Soma & Ultram for back pain/he has an appt with you tomorrow/plz advise/thx dmf

## 2017-06-13 NOTE — Telephone Encounter (Addendum)
Copied from [place] [id number]. Topic: Quick Communication - Rx Refill/Question >> Jun 13, 2017  8:06 AM Lennox Solders wrote: Medication:muscle relaxer soma and Sallye Ober [phone]. Pt has an appointment with dr Deborra Medina tomorrow. Pt is having back paIN

## 2017-06-13 NOTE — Telephone Encounter (Signed)
LMOVM that Rx's have been sent to pharm/thx dmf

## 2017-06-14 ENCOUNTER — Encounter: Payer: Self-pay | Admitting: Family Medicine

## 2017-06-14 ENCOUNTER — Ambulatory Visit (INDEPENDENT_AMBULATORY_CARE_PROVIDER_SITE_OTHER): Payer: Medicare Other | Admitting: Family Medicine

## 2017-06-14 ENCOUNTER — Ambulatory Visit (INDEPENDENT_AMBULATORY_CARE_PROVIDER_SITE_OTHER): Payer: Medicare Other

## 2017-06-14 ENCOUNTER — Telehealth: Payer: Self-pay

## 2017-06-14 VITALS — BP 104/62 | HR 101 | Temp 98.4°F | Ht 67.5 in | Wt 147.2 lb

## 2017-06-14 DIAGNOSIS — M5432 Sciatica, left side: Secondary | ICD-10-CM

## 2017-06-14 DIAGNOSIS — M545 Low back pain: Secondary | ICD-10-CM | POA: Diagnosis not present

## 2017-06-14 MED ORDER — TRAMADOL HCL 50 MG PO TABS: 50.0000 mg | ORAL_TABLET | Freq: Three times a day (TID) | ORAL | 0 refills | Status: DC | PRN

## 2017-06-14 MED ORDER — PREDNISONE 20 MG PO TABS: ORAL_TABLET | ORAL | 0 refills | Status: DC

## 2017-06-14 NOTE — Progress Notes (Signed)
Subjective:   Patient ID: George Dy., male    DOB: [birth year], 77 y.o.   MRN: [id number]  George Washington. is a pleasant 77 y.o. year old male who presents to clinic today with Back Pain (Patient is here today to F/U with back pain.  Triage call states he is having spasms, feels like hips are locking, shuffling gait.  Today he has taken an Ultram and it has dropped the pain from a 9 to a 7.  Does not have full ROM.  It is lower back and numbness from 2-5 toes bilaterally and numbness on outter sides of feet bilaterally.  )  on 06/14/2017  HPI:  Has had several weeks of worsening back pain.  No known injury.  H/o known DDD of lumbar spine. Having some paresthesias in his toes but this has improved. Ultram takes the edge off- pain from 9 to 7.  Tried muscle relaxants without much improvement (pt is a retired Software engineer).  No urinary symptoms.   Current Outpatient Medications on File Prior to Visit  Medication Sig Dispense Refill  . aspirin-acetaminophen-caffeine (EXCEDRIN MIGRAINE) 250-250-65 MG per tablet Take 1 tablet by mouth every 6 (six) hours as needed.    . carisoprodol (SOMA) 350 MG tablet Take 1 tablet (350 mg total) by mouth 3 (three) times daily as needed for muscle spasms. 60 tablet 0  . Cholecalciferol (VITAMIN D3 PO) Take 2,000 Units by mouth daily.    . Cyanocobalamin (B-12 PO) Take 6,000 mcg/mL by mouth daily.    . hydrochlorothiazide (MICROZIDE) 12.5 MG capsule Take 1 capsule (12.5 mg total) by mouth daily. 30 capsule 5  . IRON PO Take 10 drops by mouth daily.    Marland Kitchen losartan (COZAAR) 25 MG tablet Take 1 tablet (25 mg total) by mouth daily. 30 tablet 6  . meloxicam (MOBIC) 15 MG tablet Take 1 tablet (15 mg total) by mouth daily. 90 tablet 0  . Multiple Vitamins-Minerals (CENTRUM SILVER PO) Take 1 tablet by mouth daily.    . traMADol (ULTRAM) 50 MG tablet Take 1 tablet (50 mg total) by mouth every 8 (eight) hours as needed. 30 tablet 0  . traZODone (DESYREL) 50 MG tablet  TAKE ONE-HALF TO ONE TABLET BY MOUTH AT BEDTIME AS NEEDED FOR SLEEP 90 tablet 0   No current facility-administered medications on file prior to visit.     Allergies  Allergen Reactions  . Lisinopril     cough    Past Medical History:  Diagnosis Date  . Arthritis   . HTN (hypertension)   . Narcotic dependence, in remission ([place])   . Retinal vein occlusion     Past Surgical History:  Procedure Laterality Date  . cyst chest  1994   left  . EYE SURGERY    . rectal fissure  1968  . vro      Family History  Problem Relation Age of Onset  . Hyperlipidemia Father   . Hypertension Father   . Heart disease Sister 40       MI, CABG  . Colon cancer Neg Hx   . Stomach cancer Neg Hx     Social History   Socioeconomic History  . Marital status: Married    Spouse name: Not on file  . Number of children: 2  . Years of education: Not on file  . Highest education level: Not on file  Occupational History  . Occupation: Scientist, research (physical sciences): [employer]  Social Needs  . Financial resource strain: Not on file  . Food insecurity:    Worry: Not on file    Inability: Not on file  . Transportation needs:    Medical: Not on file    Non-medical: Not on file  Tobacco Use  . Smoking status: Current Every Day Smoker    Packs/day: 0.50    Types: Cigarettes  . Smokeless tobacco: Never Used  Substance and Sexual Activity  . Alcohol use: Yes    Alcohol/week: 0.0 oz    Comment: beer or wine occassionally  . Drug use: No  . Sexual activity: Not on file  Lifestyle  . Physical activity:    Days per week: Not on file    Minutes per session: Not on file  . Stress: Not on file  Relationships  . Social connections:    Talks on phone: Not on file    Gets together: Not on file    Attends religious service: Not on file    Active member of club or organization: Not on file    Attends meetings of clubs or organizations: Not on file    Relationship status: Not on file  .  Intimate partner violence:    Fear of current or ex partner: Not on file    Emotionally abused: Not on file    Physically abused: Not on file    Forced sexual activity: Not on file  Other Topics Concern  . 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      Does have a living will- does not want prolonged life support if futile.   The PMH, PSH, Social History, Family History, Medications, and allergies have been reviewed in [facility], and have been updated if relevant.   Review of Systems  Constitutional: Negative.   Genitourinary: Negative.   Musculoskeletal: Positive for back pain and gait problem.  Skin: Negative.   Neurological: Positive for numbness. Negative for dizziness, tremors, seizures, syncope, facial asymmetry, speech difficulty, weakness, light-headedness and headaches.  All other systems reviewed and are negative.      Objective:    BP 104/62 (BP Location: Left Arm, Patient Position: Sitting, Cuff Size: Normal)   Pulse (!) 101   Temp 98.4 F (36.9 C) (Oral)   Ht 5' 7.5" (1.715 m)   Wt 147 lb 3.2 oz (66.8 kg)   SpO2 (!) 61%   BMI 22.71 kg/m    Physical Exam  Constitutional: He is oriented to person, place, and time. He appears well-developed and well-nourished. No distress.  HENT:  Head: Normocephalic and atraumatic.  Eyes: Conjunctivae are normal.  Cardiovascular: Normal rate.  Pulmonary/Chest: Effort normal.  Musculoskeletal:       Lumbar back: He exhibits spasm.  SLR neg bilaterally  Neurological: He is alert and oriented to person, place, and time. No cranial nerve deficit.  Skin: Skin is warm and dry. He is not diaphoretic.  Psychiatric: He has a normal mood and affect. His behavior is normal. Judgment and thought content normal.  Nursing note and vitals reviewed.         Assessment & Plan:   No diagnosis found. No follow-ups on file.

## 2017-06-14 NOTE — Patient Instructions (Signed)
I will call you with your xray results.  Take prednisone taper, ultram for severe pain.

## 2017-06-14 NOTE — Telephone Encounter (Signed)
TA-Ins does not cover Soma/they will cover Tizanidine tabs, or Cyclobenzaprine/how do you feel about that? Plz advise/thx dmf

## 2017-06-15 ENCOUNTER — Encounter: Payer: Self-pay | Admitting: Family Medicine

## 2017-06-15 ENCOUNTER — Other Ambulatory Visit: Payer: Self-pay | Admitting: Family Medicine

## 2017-06-15 DIAGNOSIS — I714 Abdominal aortic aneurysm, without rupture, unspecified: Secondary | ICD-10-CM

## 2017-06-15 NOTE — Assessment & Plan Note (Signed)
Exam reassuring- SLR neg bilaterally, normal strength and sensation. Likely due to known DDD of spine- has not had imaging since 2013.  Lumbar xray today, course of prednisone. If no improvement, see Dr. Angelia Mould. The patient indicates understanding of these issues and agrees with the plan.

## 2017-06-21 ENCOUNTER — Encounter: Payer: Self-pay | Admitting: Family Medicine

## 2017-06-22 DIAGNOSIS — H35031 Hypertensive retinopathy, right eye: Secondary | ICD-10-CM | POA: Diagnosis not present

## 2017-06-22 DIAGNOSIS — H34812 Central retinal vein occlusion, left eye, with macular edema: Secondary | ICD-10-CM | POA: Diagnosis not present

## 2017-06-23 ENCOUNTER — Telehealth: Payer: Self-pay | Admitting: Family Medicine

## 2017-06-23 NOTE — Telephone Encounter (Signed)
Thank you.  I already responded to pt in my chart message- no radiologist did not comment on the size.

## 2017-06-23 NOTE — Telephone Encounter (Signed)
I called patient this morning to give patient appointment information. Patient wanted to know, where you able to find our the size from the radiologist?

## 2017-07-04 ENCOUNTER — Other Ambulatory Visit: Payer: Self-pay | Admitting: Family Medicine

## 2017-07-04 ENCOUNTER — Telehealth: Payer: Self-pay | Admitting: Family Medicine

## 2017-07-04 MED ORDER — TRAMADOL HCL 50 MG PO TABS: 50.0000 mg | ORAL_TABLET | Freq: Three times a day (TID) | ORAL | 0 refills | Status: DC | PRN

## 2017-07-04 MED ORDER — CARISOPRODOL 350 MG PO TABS: 350.0000 mg | ORAL_TABLET | Freq: Three times a day (TID) | ORAL | 0 refills | Status: DC | PRN

## 2017-07-04 NOTE — Telephone Encounter (Signed)
Nurse, learning disability to fax to [employer] after problem solving with address for MD being different on 2 different Rx's for pt/thx dmf

## 2017-07-04 NOTE — Telephone Encounter (Signed)
Copied from [place] [id number]. Topic: Quick Communication - See Telephone Encounter >> Jul 04, 2017 12:24 PM Rutherford Nail, NT wrote: CRM for notification. See Telephone encounter for: 07/04/17. Manuela Schwartz at [facility] states that the 2 prescriptions that are controlled substances, carisoprodol (SOMA) 350 MG tablet    &     traMADol (ULTRAM) 50 MG tablet, were faxed for Dr Deborra Medina with 2 different signatures and 2 different addresses for her. Please advise. CB#: [phone]

## 2017-07-04 NOTE — Telephone Encounter (Signed)
Tried reprinting this did not work/closing encounter after updating PCP address and will try to order and print in new encounter/thx dmf

## 2017-07-06 ENCOUNTER — Encounter: Payer: Self-pay | Admitting: Family Medicine

## 2017-07-10 ENCOUNTER — Encounter: Payer: Self-pay | Admitting: Family Medicine

## 2017-07-10 ENCOUNTER — Ambulatory Visit (INDEPENDENT_AMBULATORY_CARE_PROVIDER_SITE_OTHER): Payer: Medicare Other

## 2017-07-10 ENCOUNTER — Other Ambulatory Visit: Payer: Self-pay | Admitting: Family Medicine

## 2017-07-10 DIAGNOSIS — I714 Abdominal aortic aneurysm, without rupture, unspecified: Secondary | ICD-10-CM

## 2017-07-12 ENCOUNTER — Ambulatory Visit (INDEPENDENT_AMBULATORY_CARE_PROVIDER_SITE_OTHER): Payer: Medicare Other

## 2017-07-12 ENCOUNTER — Ambulatory Visit (INDEPENDENT_AMBULATORY_CARE_PROVIDER_SITE_OTHER): Payer: Medicare Other | Admitting: Family Medicine

## 2017-07-12 ENCOUNTER — Encounter: Payer: Self-pay | Admitting: Family Medicine

## 2017-07-12 VITALS — BP 152/98 | HR 94 | Temp 98.4°F | Ht 67.5 in | Wt 141.0 lb

## 2017-07-12 DIAGNOSIS — M5442 Lumbago with sciatica, left side: Secondary | ICD-10-CM

## 2017-07-12 DIAGNOSIS — G8929 Other chronic pain: Secondary | ICD-10-CM

## 2017-07-12 DIAGNOSIS — R0781 Pleurodynia: Secondary | ICD-10-CM

## 2017-07-12 DIAGNOSIS — M533 Sacrococcygeal disorders, not elsewhere classified: Secondary | ICD-10-CM | POA: Diagnosis not present

## 2017-07-12 DIAGNOSIS — S2232XA Fracture of one rib, left side, initial encounter for closed fracture: Secondary | ICD-10-CM | POA: Diagnosis not present

## 2017-07-12 MED ORDER — GABAPENTIN 100 MG PO CAPS: 100.0000 mg | ORAL_CAPSULE | Freq: Three times a day (TID) | ORAL | 3 refills | Status: DC
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
## 2017-07-12 NOTE — Assessment & Plan Note (Signed)
Likely a contusion  - rib xray  - counseled on conservative care
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
## 2017-07-12 NOTE — Assessment & Plan Note (Signed)
Pain with palpation over the SI joint. Normal movement with FABER  - SI joint injection today

## 2017-07-12 NOTE — Patient Instructions (Signed)
Nice to meet you!  Please try the gabapentin at night and you can increase this to three times a day  Please try the exercises I have provided  I will call you with the results from today.

## 2017-07-13 ENCOUNTER — Telehealth: Payer: Self-pay | Admitting: Family Medicine

## 2017-07-13 NOTE — Telephone Encounter (Signed)
Left VM for patient. If he calls back please have him speak with a nurse/CMA and inform that his xray shows a rib fracture on the left. We treat with conservatively with controlling pain. The P[facility] can report results to patient.   If any questions then please take the best time and phone number to call and I will try to call him back.   Rosemarie Ax, MD [facility] 07/13/2017, 12:48 PM

## 2017-07-13 NOTE — Telephone Encounter (Addendum)
Spoke with patient, verbalized understanding of your instructions regarding rib fracture. He appreciated the message. He stated still in pain at night. Please advise.

## 2017-07-13 NOTE — Telephone Encounter (Signed)
Pt has been relayed the message of the previous note, pt would like to be contacted so Dr. Raeford Razor contact at anytime pt will be waiting for your call

## 2017-07-14 ENCOUNTER — Telehealth: Payer: Self-pay | Admitting: Family Medicine

## 2017-07-14 NOTE — Telephone Encounter (Signed)
Spoke with patient about his rib fracture. Will try tramadol, NSAID and tylenol. Can try compression and icing. Will call if any problems.   Rosemarie Ax, MD [facility] 07/14/2017, 4:30 PM

## 2017-07-17 ENCOUNTER — Encounter: Payer: Self-pay | Admitting: Family Medicine

## 2017-08-17 ENCOUNTER — Telehealth: Payer: Self-pay | Admitting: Family Medicine

## 2017-08-17 ENCOUNTER — Encounter: Payer: Self-pay | Admitting: Family Medicine

## 2017-08-17 ENCOUNTER — Other Ambulatory Visit: Payer: Self-pay | Admitting: Family Medicine

## 2017-08-17 MED ORDER — TRAMADOL HCL 50 MG PO TABS: 50.0000 mg | ORAL_TABLET | Freq: Three times a day (TID) | ORAL | 0 refills | Status: DC | PRN

## 2017-08-17 MED ORDER — CARISOPRODOL 350 MG PO TABS: 350.0000 mg | ORAL_TABLET | Freq: Three times a day (TID) | ORAL | 0 refills | Status: DC | PRN

## 2017-08-17 MED ORDER — PREDNISONE 10 MG PO TABS: ORAL_TABLET | ORAL | 0 refills | Status: DC

## 2017-08-17 NOTE — Telephone Encounter (Signed)
Please see request for med and another prednisone refill?

## 2017-08-17 NOTE — Telephone Encounter (Signed)
First fax went through ok. Rx refax again per pharmacy.

## 2017-08-17 NOTE — Telephone Encounter (Signed)
Pt is aware. Both rx faxed and e send to pharmacy.

## 2017-08-17 NOTE — Telephone Encounter (Signed)
Copied from [place] [phone]. Topic: Quick Communication - See Telephone Encounter >> Aug 17, 2017  1:46 PM Ether Griffins B wrote: CRM for notification. See Telephone encounter for: 08/17/17.  Robert from [employer] calling over to request the medications be [place] faxed or escribed. carisoprodol (SOMA) 350 MG tablet,  traMADol (ULTRAM) 50 MG tablet

## 2017-09-07 ENCOUNTER — Other Ambulatory Visit: Payer: Self-pay | Admitting: Family Medicine

## 2017-09-08 ENCOUNTER — Other Ambulatory Visit: Payer: Self-pay | Admitting: Family Medicine

## 2017-09-08 MED ORDER — MELOXICAM 15 MG PO TABS: 15.0000 mg | ORAL_TABLET | Freq: Every day | ORAL | 0 refills | Status: DC

## 2017-09-19 DIAGNOSIS — Z961 Presence of intraocular lens: Secondary | ICD-10-CM | POA: Diagnosis not present

## 2017-09-19 DIAGNOSIS — H35031 Hypertensive retinopathy, right eye: Secondary | ICD-10-CM | POA: Diagnosis not present

## 2017-09-19 DIAGNOSIS — H34812 Central retinal vein occlusion, left eye, with macular edema: Secondary | ICD-10-CM | POA: Diagnosis not present

## 2017-09-19 DIAGNOSIS — H3562 Retinal hemorrhage, left eye: Secondary | ICD-10-CM | POA: Diagnosis not present

## 2017-09-25 ENCOUNTER — Other Ambulatory Visit: Payer: Self-pay | Admitting: Family Medicine

## 2017-09-27 ENCOUNTER — Other Ambulatory Visit: Payer: Self-pay | Admitting: Family Medicine

## 2017-09-27 MED ORDER — TRAMADOL HCL 50 MG PO TABS: 50.0000 mg | ORAL_TABLET | Freq: Three times a day (TID) | ORAL | 0 refills | Status: AC | PRN

## 2017-09-27 NOTE — Telephone Encounter (Signed)
TA-Is refill appropriate/plz advise/thx dmf

## 2017-09-28 NOTE — Telephone Encounter (Signed)
This was sent in today already/thx dmf

## 2017-11-01 DIAGNOSIS — R809 Proteinuria, unspecified: Secondary | ICD-10-CM | POA: Diagnosis not present

## 2017-11-01 DIAGNOSIS — Z720 Tobacco use: Secondary | ICD-10-CM | POA: Diagnosis not present

## 2017-11-01 DIAGNOSIS — I10 Essential (primary) hypertension: Secondary | ICD-10-CM | POA: Diagnosis not present

## 2017-11-01 DIAGNOSIS — N183 Chronic kidney disease, stage 3 (moderate): Secondary | ICD-10-CM | POA: Diagnosis not present

## 2017-11-01 DIAGNOSIS — N2581 Secondary hyperparathyroidism of renal origin: Secondary | ICD-10-CM | POA: Diagnosis not present

## 2017-11-01 DIAGNOSIS — D631 Anemia in chronic kidney disease: Secondary | ICD-10-CM | POA: Diagnosis not present

## 2017-11-02 DIAGNOSIS — N183 Chronic kidney disease, stage 3 (moderate): Secondary | ICD-10-CM | POA: Diagnosis not present

## 2017-11-02 DIAGNOSIS — R809 Proteinuria, unspecified: Secondary | ICD-10-CM | POA: Diagnosis not present

## 2017-11-02 DIAGNOSIS — I129 Hypertensive chronic kidney disease with stage 1 through stage 4 chronic kidney disease, or unspecified chronic kidney disease: Secondary | ICD-10-CM | POA: Diagnosis not present

## 2017-11-02 DIAGNOSIS — E875 Hyperkalemia: Secondary | ICD-10-CM | POA: Diagnosis not present

## 2017-11-02 LAB — BASIC METABOLIC PANEL
BUN: 22 — AB (ref 4–21)
Creatinine: 1.6 — AB (ref 0.6–1.3)
Glucose: 88
Potassium: 4.2 (ref 3.4–5.3)
Sodium: 136 — AB (ref 137–147)

## 2017-11-02 LAB — CBC AND DIFFERENTIAL
HCT: 33 — AB (ref 41–53)
HEMOGLOBIN: 10.7 — AB (ref 13.5–17.5)
Neutrophils Absolute: 4693
Platelets: 233 (ref 150–399)
WBC: 6.5

## 2017-11-02 LAB — MICROALBUMIN, URINE: MICROALB UR: 21

## 2017-11-05 ENCOUNTER — Other Ambulatory Visit: Payer: Self-pay | Admitting: Family Medicine

## 2017-11-05 DIAGNOSIS — G8929 Other chronic pain: Secondary | ICD-10-CM

## 2017-11-05 DIAGNOSIS — M5442 Lumbago with sciatica, left side: Principal | ICD-10-CM

## 2017-11-07 ENCOUNTER — Encounter: Payer: Self-pay | Admitting: Family Medicine

## 2017-11-07 DIAGNOSIS — H34812 Central retinal vein occlusion, left eye, with macular edema: Secondary | ICD-10-CM | POA: Diagnosis not present

## 2017-11-07 NOTE — Progress Notes (Signed)
[facility]/thx dmf

## 2017-12-07 ENCOUNTER — Other Ambulatory Visit: Payer: Self-pay | Admitting: Family Medicine

## 2017-12-09 ENCOUNTER — Other Ambulatory Visit: Payer: Self-pay | Admitting: Family Medicine

## 2017-12-10 ENCOUNTER — Other Ambulatory Visit: Payer: Self-pay | Admitting: Family Medicine

## 2017-12-12 MED ORDER — TRAZODONE HCL 50 MG PO TABS: ORAL_TABLET | ORAL | 0 refills | Status: AC

## 2017-12-12 NOTE — Telephone Encounter (Signed)
This was sent in/thx dmf

## 2017-12-19 ENCOUNTER — Other Ambulatory Visit: Payer: Self-pay

## 2017-12-19 DIAGNOSIS — I159 Secondary hypertension, unspecified: Secondary | ICD-10-CM

## 2017-12-19 DIAGNOSIS — Z125 Encounter for screening for malignant neoplasm of prostate: Secondary | ICD-10-CM

## 2017-12-19 DIAGNOSIS — D509 Iron deficiency anemia, unspecified: Secondary | ICD-10-CM

## 2017-12-19 DIAGNOSIS — I10 Essential (primary) hypertension: Secondary | ICD-10-CM

## 2017-12-26 NOTE — Progress Notes (Signed)
Subjective:   George Washington. is a 77 y.o. male who presents for Medicare Annual/Subsequent preventive examination.  Review of Systems: No ROS.  Medicare Wellness Visit. Additional risk factors are reflected in the social history. Cardiac Risk Factors include: advanced age (>28men, >57 women);hypertension;male gender Sleep patterns: Takes Trazodone. Sleeps well. Home Safety/Smoke Alarms: Feels safe in home. Smoke alarms in place. Lives with wife in 2 story home.  Eye- next appt Dec. Male:   CCS- next due 2023. Last reported normal  PSA-  Lab Results  Component Value Date   PSA 0.88 12/09/2016   PSA 1.05 11/10/2015   PSA 1.05 11/04/2014       Objective:    Vitals: BP 120/64 (BP Location: Left Arm, Patient Position: Sitting, Cuff Size: Normal)   Pulse 92   Ht 5\' 8"  (1.727 m)   Wt 151 lb 9.6 oz (68.8 kg)   SpO2 98%   BMI 23.05 kg/m   Body mass index is 23.05 kg/m.  Advanced Directives 12/27/2017 12/09/2016  Does Patient Have a Medical Advance Directive? Yes Yes  Type of Paramedic of George Washington;Living will George [place];Living will  Copy of [place] in Chart? No - copy requested No - copy requested    Tobacco Social History   Tobacco Use  Smoking Status Current Every Day Smoker  . Packs/day: 0.50  . Types: Cigarettes  Smokeless Tobacco Never Used  Tobacco Comment   pt refused materials     Ready to quit: No Counseling given: No Comment: pt refused materials   Clinical Intake:     Pain : No/denies pain                 Past Medical History:  Diagnosis Date  . Arthritis   . HTN (hypertension)   . Narcotic dependence, in remission ([place])   . Retinal vein occlusion    Past Surgical History:  Procedure Laterality Date  . cyst chest  1994   left  . EYE SURGERY    . rectal fissure  1968  . vro     Family History  Problem Relation Age of Onset  . Hyperlipidemia Father   .  Hypertension Father   . Heart disease Sister 34       MI, CABG  . Colon cancer Neg Hx   . Stomach cancer Neg Hx    Social History   Socioeconomic History  . Marital status: Married    Spouse name: Not on file  . Number of children: 2  . Years of education: Not on file  . Highest education level: Not on file  Occupational History  . Occupation: Scientist, research (physical sciences): [employer]  Social Needs  . Financial resource strain: Not on file  . Food insecurity:    Worry: Not on file    Inability: Not on file  . Transportation needs:    Medical: Not on file    Non-medical: Not on file  Tobacco Use  . Smoking status: Current Every Day Smoker    Packs/day: 0.50    Types: Cigarettes  . Smokeless tobacco: Never Used  . Tobacco comment: pt refused materials  Substance and Sexual Activity  . Alcohol use: Yes    Alcohol/week: 0.0 standard drinks    Comment: beer or wine occassionally  . Drug use: No  . Sexual activity: Yes  Lifestyle  . Physical activity:    Days per week: Not on  file    Minutes per session: Not on file  . Stress: Not on file  Relationships  . Social connections:    Talks on phone: Not on file    Gets together: Not on file    Attends religious service: Not on file    Active member of club or organization: Not on file    Attends meetings of clubs or organizations: Not on file    Relationship status: Not on file  Other Topics Concern  . 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      Does have a living will- does not want prolonged life support if futile.    Outpatient Encounter Medications as of 12/27/2017  Medication Sig  . aspirin-acetaminophen-caffeine (EXCEDRIN MIGRAINE) [id number] MG per tablet Take 1 tablet by mouth every 6 (six) hours as needed.  . Cholecalciferol (VITAMIN D3 PO) Take 2,000 Units by mouth daily.  . Cyanocobalamin (B-12 PO) Take 6,000 mcg/mL by mouth daily.  Marland Kitchen  gabapentin (NEURONTIN) 100 MG capsule Take 1 capsule (100 mg total) by mouth 3 (three) times daily.  . hydrochlorothiazide (MICROZIDE) 12.5 MG capsule Take 1 capsule (12.5 mg total) by mouth daily.  Marland Kitchen losartan (COZAAR) 25 MG tablet Take 1 tablet (25 mg total) by mouth daily.  . meloxicam (MOBIC) 15 MG tablet TAKE ONE TABLET BY MOUTH DAILY  . Multiple Vitamins-Minerals (CENTRUM SILVER PO) Take 1 tablet by mouth daily.  . traZODone (DESYREL) 50 MG tablet TAKE 0.5-1 TABLET EVERY NIGHT AT BEDTIME AS NEEDED FOR SLEEP  . carisoprodol (SOMA) 350 MG tablet Take 1 tablet (350 mg total) by mouth 3 (three) times daily as needed for muscle spasms. (Patient not taking: Reported on 12/27/2017)  . IRON PO Take 10 drops by mouth daily.  . predniSONE (DELTASONE) 10 MG tablet 3 tabs by mouth x 3 days, 2 tabs by mouth x 2 days, 1 tab by mouth x 2 days and stop. (Patient not taking: Reported on 12/27/2017)  . traMADol (ULTRAM) 50 MG tablet Take 1 tablet (50 mg total) by mouth every 8 (eight) hours as needed. (Patient not taking: Reported on 12/27/2017)   No facility-administered encounter medications on file as of 12/27/2017.     Activities of Daily Living In your present state of health, do you have any difficulty performing the following activities: 12/27/2017  Hearing? N  Vision? N  Difficulty concentrating or making decisions? N  Walking or climbing stairs? N  Dressing or bathing? N  Doing errands, shopping? N  Preparing Food and eating ? N  Using the Toilet? N  In the past six months, have you accidently leaked urine? N  Do you have problems with loss of bowel control? N  Managing your Medications? N  Managing your Finances? N  Housekeeping or managing your Housekeeping? N  Some recent data might be hidden    Patient Care Team: Lucille Passy, MD as PCP - General (Family Medicine) Milus Banister, MD as Attending Physician (Gastroenterology)   Assessment:   This is a routine wellness examination  for [name]. Physical assessment deferred to PCP.  Exercise Activities and Dietary recommendations Current Exercise Habits: Home exercise routine, Type of exercise: walking, Time (Minutes): 20, Frequency (Times/Week): 1, Weekly Exercise (Minutes/Week): 20, Exercise limited by: None identified Diet (meal preparation, eat out, water intake, caffeinated beverages, dairy products, fruits and vegetables): well balanced     Goals    .  Increase water intake     Starting 12/09/2016, I will continue to drink at least 6-8 glasses of water daily.       Fall Risk Fall Risk  12/27/2017 12/13/2016 12/09/2016 11/19/2015 11/11/2014  Falls in the past year? No No No No No    Depression Screen PHQ 2/9 Scores 12/27/2017 12/13/2016 12/09/2016 11/19/2015  PHQ - 2 Score 0 0 0 0  PHQ- 9 Score - 0 0 -    Cognitive Function Ad8 score reviewed for issues:  Issues making decisions:no  Less interest in hobbies / activities:no  Repeats questions, stories (family complaining):no  Trouble using ordinary gadgets (microwave, computer, phone):no  Forgets the month or year: no  Mismanaging finances: no  Remembering appts:no  Daily problems with thinking and/or memory:no Ad8 score is=0   MMSE - Mini Mental State Exam 12/09/2016  Orientation to time 5  Orientation to Place 5  Registration 3  Attention/ Calculation 0  Recall 3  Language- name 2 objects 0  Language- repeat 1  Language- follow 3 step command 3  Language- read & follow direction 0  Write a sentence 0  Copy design 0  Total score 20          Screening Tests Health Maintenance  Topic Date Due  . INFLUENZA VACCINE  10/12/2017  . TETANUS/TDAP  11/15/2021  . PNA vac Low Risk Adult  Completed         Plan:    Please schedule your next medicare wellness visit with me in 1 yr.  Continue to eat heart healthy diet (full of fruits, vegetables, whole grains, lean protein, water--limit salt, fat, and sugar intake) and increase physical  activity as tolerated.    I have personally reviewed and noted the following in the patient's chart:   . Medical and social history . Use of alcohol, tobacco or illicit drugs  . Current medications and supplements . Functional ability and status . Nutritional status . Physical activity . Advanced directives . List of other physicians . Hospitalizations, surgeries, and ER visits in previous 12 months . Vitals . Screenings to include cognitive, depression, and falls . Referrals and appointments  In addition, I have reviewed and discussed with patient certain preventive protocols, quality metrics, and best practice recommendations. A written personalized care plan for preventive services as well as general preventive health recommendations were provided to patient.     Shela Nevin, [place]  12/27/2017

## 2017-12-27 ENCOUNTER — Ambulatory Visit (INDEPENDENT_AMBULATORY_CARE_PROVIDER_SITE_OTHER): Payer: Medicare Other | Admitting: Family Medicine

## 2017-12-27 ENCOUNTER — Ambulatory Visit (INDEPENDENT_AMBULATORY_CARE_PROVIDER_SITE_OTHER): Payer: Medicare Other | Admitting: Behavioral Health

## 2017-12-27 ENCOUNTER — Encounter: Payer: Self-pay | Admitting: Family Medicine

## 2017-12-27 ENCOUNTER — Encounter: Payer: Self-pay | Admitting: Behavioral Health

## 2017-12-27 VITALS — BP 120/64 | HR 92 | Ht 68.0 in | Wt 151.6 lb

## 2017-12-27 VITALS — BP 100/64 | HR 82 | Temp 98.2°F | Ht 68.0 in | Wt 151.6 lb

## 2017-12-27 DIAGNOSIS — Z125 Encounter for screening for malignant neoplasm of prostate: Secondary | ICD-10-CM | POA: Diagnosis not present

## 2017-12-27 DIAGNOSIS — Z23 Encounter for immunization: Secondary | ICD-10-CM | POA: Diagnosis not present

## 2017-12-27 DIAGNOSIS — G4700 Insomnia, unspecified: Secondary | ICD-10-CM

## 2017-12-27 DIAGNOSIS — M1990 Unspecified osteoarthritis, unspecified site: Secondary | ICD-10-CM | POA: Diagnosis not present

## 2017-12-27 DIAGNOSIS — D509 Iron deficiency anemia, unspecified: Secondary | ICD-10-CM

## 2017-12-27 DIAGNOSIS — G8929 Other chronic pain: Secondary | ICD-10-CM | POA: Diagnosis not present

## 2017-12-27 DIAGNOSIS — M5442 Lumbago with sciatica, left side: Secondary | ICD-10-CM | POA: Diagnosis not present

## 2017-12-27 DIAGNOSIS — I159 Secondary hypertension, unspecified: Secondary | ICD-10-CM | POA: Diagnosis not present

## 2017-12-27 DIAGNOSIS — N182 Chronic kidney disease, stage 2 (mild): Secondary | ICD-10-CM | POA: Diagnosis not present

## 2017-12-27 DIAGNOSIS — Z0000 Encounter for general adult medical examination without abnormal findings: Secondary | ICD-10-CM

## 2017-12-27 DIAGNOSIS — I10 Essential (primary) hypertension: Secondary | ICD-10-CM

## 2017-12-27 LAB — COMPREHENSIVE METABOLIC PANEL
ALT: 13 U/L (ref 0–53)
AST: 17 U/L (ref 0–37)
Albumin: 4.1 g/dL (ref 3.5–5.2)
Alkaline Phosphatase: 43 U/L (ref 39–117)
BUN: 27 mg/dL — ABNORMAL HIGH (ref 6–23)
CO2: 28 mEq/L (ref 19–32)
Calcium: 10 mg/dL (ref 8.4–10.5)
Chloride: 101 mEq/L (ref 96–112)
Creatinine, Ser: 1.79 mg/dL — ABNORMAL HIGH (ref 0.40–1.50)
GFR: 39.34 mL/min — ABNORMAL LOW (ref >60.00)
Glucose, Bld: 100 mg/dL — ABNORMAL HIGH (ref 70–99)
Potassium: 4.7 mEq/L (ref 3.5–5.1)
Sodium: 137 mEq/L (ref 135–145)
Total Bilirubin: 0.4 mg/dL (ref 0.2–1.2)
Total Protein: 6.9 g/dL (ref 6.0–8.3)

## 2017-12-27 LAB — CBC WITH DIFFERENTIAL/PLATELET
Basophils Absolute: 0.1 10*3/uL (ref 0.0–0.1)
Basophils Relative: 1.1 % (ref 0.0–3.0)
Eosinophils Absolute: 0.2 10*3/uL (ref 0.0–0.7)
Eosinophils Relative: 2.9 % (ref 0.0–5.0)
HCT: 36.4 % — ABNORMAL LOW (ref 39.0–52.0)
Hemoglobin: 11.6 g/dL — ABNORMAL LOW (ref 13.0–17.0)
Lymphocytes Relative: 12.6 % (ref 12.0–46.0)
Lymphs Abs: 0.7 10*3/uL (ref 0.7–4.0)
MCHC: 31.7 g/dL (ref 30.0–36.0)
MCV: 85.7 fl (ref 78.0–100.0)
Monocytes Absolute: 0.5 10*3/uL (ref 0.1–1.0)
Monocytes Relative: 8.9 % (ref 3.0–12.0)
Neutro Abs: 4.2 10*3/uL (ref 1.4–7.7)
Neutrophils Relative %: 74.5 % (ref 43.0–77.0)
Platelets: 268 10*3/uL (ref 150.0–400.0)
RBC: 4.25 Mil/uL (ref 4.22–5.81)
RDW: 16.8 % — ABNORMAL HIGH (ref 11.5–15.5)
WBC: 5.6 10*3/uL (ref 4.0–10.5)

## 2017-12-27 LAB — LIPID PANEL
CHOL/HDL RATIO: 3
CHOLESTEROL: 200 mg/dL (ref 0–200)
HDL: 64.7 mg/dL (ref >39.00)
LDL Cholesterol: 117 mg/dL — ABNORMAL HIGH (ref 0–99)
NonHDL: 134.95
TRIGLYCERIDES: 90 mg/dL (ref 0.0–149.0)
VLDL: 18 mg/dL (ref 0.0–40.0)

## 2017-12-27 LAB — FERRITIN: FERRITIN: 16.3 ng/mL — AB (ref 22.0–322.0)

## 2017-12-27 LAB — PSA, MEDICARE: PSA: 0.86 ng/ml (ref 0.10–4.00)

## 2017-12-27 LAB — IRON: IRON: 89 ug/dL (ref 42–165)

## 2017-12-27 NOTE — Assessment & Plan Note (Signed)
Chronic, has been work up. He has refused iron.

## 2017-12-27 NOTE — Assessment & Plan Note (Signed)
Now with some hypotension/orthostasis.  Continue losartan 25 mg daily for renal protection.  D/c HCTZ 12.5 mg daily. His wife is an Therapist, sports and she will check his BP for me several times per week over the next several weeks and he will update me. The patient indicates understanding of these issues and agrees with the plan.

## 2017-12-27 NOTE — Assessment & Plan Note (Signed)
Improved with trazodone. No changes made to dosage.

## 2017-12-27 NOTE — Progress Notes (Signed)
Subjective:   Patient ID: George Dy., male    DOB: [birth year], 77 y.o.   MRN: [id number]  George Busler. is a pleasant 77 y.o. year old male who presents to clinic today with Follow-up (Patient is here today to F/U after AWV with Wellness Coach.  He has had fasting labs drawn.  He agrees to flu shot today.)  on 12/27/2017  HPI:  Insomnia- sleeping better with trazodone.  Iron deficiency anemia- previous work up by hematology and GI. Was taking iron but caused constipation.  Denies blood in his stool or urine.  Lab Results  Component Value Date   WBC 6.5 11/02/2017   HGB 10.7 (A) 11/02/2017   HCT 33 (A) 11/02/2017   MCV 82.7 12/09/2016   PLT 233 11/02/2017     HTN- taking cozaar 25 mg daily and hctz 12.5 mg daily. BP is low today and per pt it has been and perhaps having some orthostatic symptoms. Lab Results  Component Value Date   CREATININE 1.6 (A) 11/02/2017   CKD- followed by renal.  Cr stable.  Sees Dr. Abigail Butts in January.  Low back pain with sciaticia- doing well with Gabapentin 100 mg three times daily. Rarely takes tramadol- only for severe pain. Tries to limit NSAID use to renal function.  Current Outpatient Medications on File Prior to Visit  Medication Sig Dispense Refill  . aspirin-acetaminophen-caffeine (EXCEDRIN MIGRAINE) 250-250-65 MG per tablet Take 1 tablet by mouth every 6 (six) hours as needed.    . carisoprodol (SOMA) 350 MG tablet Take 1 tablet (350 mg total) by mouth 3 (three) times daily as needed for muscle spasms. 60 tablet 0  . Cholecalciferol (VITAMIN D3 PO) Take 2,000 Units by mouth daily.    . Cyanocobalamin (B-12 PO) Take 6,000 mcg/mL by mouth daily.    Marland Kitchen gabapentin (NEURONTIN) 100 MG capsule Take 1 capsule (100 mg total) by mouth 3 (three) times daily. 90 capsule 2  . hydrochlorothiazide (MICROZIDE) 12.5 MG capsule Take 1 capsule (12.5 mg total) by mouth daily. 30 capsule 5  . losartan (COZAAR) 25 MG tablet Take 1 tablet (25 mg  total) by mouth daily. 30 tablet 6  . meloxicam (MOBIC) 15 MG tablet TAKE ONE TABLET BY MOUTH DAILY 90 tablet 0  . Multiple Vitamins-Minerals (CENTRUM SILVER PO) Take 1 tablet by mouth daily.    . traMADol (ULTRAM) 50 MG tablet Take 1 tablet (50 mg total) by mouth every 8 (eight) hours as needed. 30 tablet 0  . traZODone (DESYREL) 50 MG tablet TAKE 0.5-1 TABLET EVERY NIGHT AT BEDTIME AS NEEDED FOR SLEEP 90 tablet 0   No current facility-administered medications on file prior to visit.     Allergies  Allergen Reactions  . Lisinopril     cough    Past Medical History:  Diagnosis Date  . Arthritis   . HTN (hypertension)   . Narcotic dependence, in remission ([place])   . Retinal vein occlusion     Past Surgical History:  Procedure Laterality Date  . cyst chest  1994   left  . EYE SURGERY    . rectal fissure  1968  . vro      Family History  Problem Relation Age of Onset  . Hyperlipidemia Father   . Hypertension Father   . Heart disease Sister 65       MI, CABG  . Colon cancer Neg Hx   . Stomach cancer Neg Hx     Social  History   Socioeconomic History  . Marital status: Married    Spouse name: Not on file  . Number of children: 2  . Years of education: Not on file  . Highest education level: Not on file  Occupational History  . Occupation: Scientist, research (physical sciences): [employer]  Social Needs  . Financial resource strain: Not on file  . Food insecurity:    Worry: Not on file    Inability: Not on file  . Transportation needs:    Medical: Not on file    Non-medical: Not on file  Tobacco Use  . Smoking status: Current Every Day Smoker    Packs/day: 0.50    Types: Cigarettes  . Smokeless tobacco: Never Used  . Tobacco comment: pt refused materials  Substance and Sexual Activity  . Alcohol use: Yes    Alcohol/week: 0.0 standard drinks    Comment: beer or wine occassionally  . Drug use: No  . Sexual activity: Yes  Lifestyle  . Physical activity:     Days per week: Not on file    Minutes per session: Not on file  . Stress: Not on file  Relationships  . Social connections:    Talks on phone: Not on file    Gets together: Not on file    Attends religious service: Not on file    Active member of club or organization: Not on file    Attends meetings of clubs or organizations: Not on file    Relationship status: Not on file  . Intimate partner violence:    Fear of current or ex partner: Not on file    Emotionally abused: Not on file    Physically abused: Not on file    Forced sexual activity: Not on file  Other Topics Concern  . 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      Does have a living will- does not want prolonged life support if futile.   The PMH, PSH, Social History, Family History, Medications, and allergies have been reviewed in [facility], and have been updated if relevant.   Review of Systems  Constitutional: Negative.   HENT: Negative.   Eyes: Negative.   Respiratory: Negative.   Cardiovascular: Negative.   Gastrointestinal: Negative.   Endocrine: Negative.   Genitourinary: Negative.   Musculoskeletal: Positive for back pain.  Skin: Negative.   Allergic/Immunologic: Negative.   Neurological: Positive for dizziness. Negative for tremors, seizures, syncope, facial asymmetry, speech difficulty, weakness, light-headedness, numbness and headaches.  Hematological: Negative.   Psychiatric/Behavioral: Negative.   All other systems reviewed and are negative.      Objective:    BP 120/64 (BP Location: Left Arm, Patient Position: Sitting, Cuff Size: Normal)   Pulse 92   Temp 98.2 F (36.8 C) (Oral)   Ht '5\' 8"'  (1.727 m)   Wt 151 lb 9.6 oz (68.8 kg)   SpO2 98%   BMI 23.05 kg/m   Wt Readings from Last 3 Encounters:  12/27/17 151 lb 9.6 oz (68.8 kg)  12/27/17 151 lb 9.6 oz (68.8 kg)  07/12/17 141 lb (64 kg)   BP Readings from Last 3 Encounters:    12/27/17 120/64  12/27/17 120/64  07/12/17 (!) 152/98     Physical Exam  General:  pleasant male in no acute distress Eyes:  PERRL Ears:  External ear exam shows no significant lesions or deformities.  TMs normal bilaterally Hearing is grossly normal bilaterally. Nose:  External nasal examination shows no deformity or inflammation. Nasal mucosa are pink and moist without lesions or exudates. Mouth:  Oral mucosa and oropharynx without lesions or exudates.  Teeth in good repair. Neck:  no carotid bruit or thyromegaly no cervical or supraclavicular lymphadenopathy  Lungs:  Normal respiratory effort, chest expands symmetrically. Lungs are clear to auscultation, no crackles or wheezes. Heart:  Normal rate and regular rhythm. S1 and S2 normal without gallop, murmur, click, rub or other extra sounds. Abdomen:  Bowel sounds positive,abdomen soft and non-tender without masses, organomegaly or hernias noted. Pulses:  R and L posterior tibial pulses are full and equal bilaterally  Extremities:  no edema  Psych:  Good eye contact, not anxious or depressed appearing       Assessment & Plan:   Hypertension, unspecified type - Plan: Lipid Profile, Comp Met (CMET)  Iron deficiency anemia, unspecified iron deficiency anemia type - Plan: Iron, Ferritin, CBC w/Diff  Screening for prostate cancer - Plan: PSA, Medicare  Secondary hypertension - Plan: Lipid Profile, Comp Met (CMET)  Need for influenza vaccination - Plan: Flu Vaccine QUAD 6+ mos PF IM (Fluarix Quad PF) No follow-ups on file.

## 2017-12-27 NOTE — Assessment & Plan Note (Signed)
Labs today. Followed by renal.  Hopefully by stopping HCTZ we are further helping his kidney function.

## 2017-12-27 NOTE — Patient Instructions (Addendum)
Great to see you. I will call you with your lab results from today and you can view them online.   Please say hi to [facility] for me.  Please STOP taking HCTZ 12.5 mg daily.  Check your blood pressure a few times a week for the next three weeks and update.

## 2017-12-27 NOTE — Patient Instructions (Addendum)
Please schedule your next medicare wellness visit with me in 1 yr.  Continue to eat heart healthy diet (full of fruits, vegetables, whole grains, lean protein, water--limit salt, fat, and sugar intake) and increase physical activity as tolerated.   George Washington , Thank you for taking time to come for your Medicare Wellness Visit. I appreciate your ongoing commitment to your health goals. Please review the following plan we discussed and let me know if I can assist you in the future.   These are the goals we discussed: Goals    . Increase water intake     Starting 12/09/2016, I will continue to drink at least 6-8 glasses of water daily.       This is a list of the screening recommended for you and due dates:  Health Maintenance  Topic Date Due  . Flu Shot  10/12/2017  . Tetanus Vaccine  11/15/2021  . Pneumonia vaccines  Completed    Health Maintenance, Male A healthy lifestyle and preventive care is important for your health and wellness. Ask your health care provider about what schedule of regular examinations is right for you. What should I know about weight and diet? Eat a Healthy Diet  Eat plenty of vegetables, fruits, whole grains, low-fat dairy products, and lean protein.  Do not eat a lot of foods high in solid fats, added sugars, or salt.  Maintain a Healthy Weight Regular exercise can help you achieve or maintain a healthy weight. You should:  Do at least 150 minutes of exercise each week. The exercise should increase your heart rate and make you sweat (moderate-intensity exercise).  Do strength-training exercises at least twice a week.  Watch Your Levels of Cholesterol and Blood Lipids  Have your blood tested for lipids and cholesterol every 5 years starting at 77 years of age. If you are at high risk for heart disease, you should start having your blood tested when you are 77 years old. You may need to have your cholesterol levels checked more often if: ? Your lipid or  cholesterol levels are high. ? You are older than 77 years of age. ? You are at high risk for heart disease.  What should I know about cancer screening? Many types of cancers can be detected early and may often be prevented. Lung Cancer  You should be screened every year for lung cancer if: ? You are a current smoker who has smoked for at least 30 years. ? You are a former smoker who has quit within the past 15 years.  Talk to your health care provider about your screening options, when you should start screening, and how often you should be screened.  Colorectal Cancer  Routine colorectal cancer screening usually begins at 77 years of age and should be repeated every 5-10 years until you are 77 years old. You may need to be screened more often if early forms of precancerous polyps or small growths are found. Your health care provider may recommend screening at an earlier age if you have risk factors for colon cancer.  Your health care provider may recommend using home test kits to check for hidden blood in the stool.  A small camera at the end of a tube can be used to examine your colon (sigmoidoscopy or colonoscopy). This checks for the earliest forms of colorectal cancer.  Prostate and Testicular Cancer  Depending on your age and overall health, your health care provider may do certain tests to screen for prostate  and testicular cancer.  Talk to your health care provider about any symptoms or concerns you have about testicular or prostate cancer.  Skin Cancer  Check your skin from head to toe regularly.  Tell your health care provider about any new moles or changes in moles, especially if: ? There is a change in a mole's size, shape, or color. ? You have a mole that is larger than a pencil eraser.  Always use sunscreen. Apply sunscreen liberally and repeat throughout the day.  Protect yourself by wearing long sleeves, pants, a wide-brimmed hat, and sunglasses when  outside.  What should I know about heart disease, diabetes, and high blood pressure?  If you are 3-93 years of age, have your blood pressure checked every 3-5 years. If you are 26 years of age or older, have your blood pressure checked every year. You should have your blood pressure measured twice-once when you are at a hospital or clinic, and once when you are not at a hospital or clinic. Record the average of the two measurements. To check your blood pressure when you are not at a hospital or clinic, you can use: ? An automated blood pressure machine at a pharmacy. ? A home blood pressure monitor.  Talk to your health care provider about your target blood pressure.  If you are between 68-54 years old, ask your health care provider if you should take aspirin to prevent heart disease.  Have regular diabetes screenings by checking your fasting blood sugar level. ? If you are at a normal weight and have a low risk for diabetes, have this test once every three years after the age of 57. ? If you are overweight and have a high risk for diabetes, consider being tested at a younger age or more often.  A one-time screening for abdominal aortic aneurysm (AAA) by ultrasound is recommended for men aged 66-75 years who are current or former smokers. What should I know about preventing infection? Hepatitis B If you have a higher risk for hepatitis B, you should be screened for this virus. Talk with your health care provider to find out if you are at risk for hepatitis B infection. Hepatitis C Blood testing is recommended for:  Everyone born from [birth year] through 1965.  Anyone with known risk factors for hepatitis C.  Sexually Transmitted Diseases (STDs)  You should be screened each year for STDs including gonorrhea and chlamydia if: ? You are sexually active and are younger than 77 years of age. ? You are older than 77 years of age and your health care provider tells you that you are at risk for this  type of infection. ? Your sexual activity has changed since you were last screened and you are at an increased risk for chlamydia or gonorrhea. Ask your health care provider if you are at risk.  Talk with your health care provider about whether you are at high risk of being infected with HIV. Your health care provider may recommend a prescription medicine to help prevent HIV infection.  What else can I do?  Schedule regular health, dental, and eye exams.  Stay current with your vaccines (immunizations).  Do not use any tobacco products, such as cigarettes, chewing tobacco, and e-cigarettes. If you need help quitting, ask your health care provider.  Limit alcohol intake to no more than 2 drinks per day. One drink equals 12 ounces of beer, 5 ounces of wine, or 1 ounces of hard liquor.  Do not use  street drugs.  Do not share needles.  Ask your health care provider for help if you need support or information about quitting drugs.  Tell your health care provider if you often feel depressed.  Tell your health care provider if you have ever been abused or do not feel safe at home. This information is not intended to replace advice given to you by your health care provider. Make sure you discuss any questions you have with your health care provider. Document Released: 08/27/2007 Document Revised: 10/28/2015 Document Reviewed: 12/02/2014 Elsevier Interactive Patient Education  [employer].

## 2017-12-27 NOTE — Progress Notes (Signed)
I reviewed health advisor's note, was available for consultation, and agree with documentation and plan.  

## 2017-12-27 NOTE — Assessment & Plan Note (Signed)
Improved with current dose of Gabapentin.  No changes made to dose.

## 2018-01-12 DIAGNOSIS — I5020 Unspecified systolic (congestive) heart failure: Secondary | ICD-10-CM

## 2018-01-12 HISTORY — DX: Unspecified systolic (congestive) heart failure: I50.20

## 2018-01-20 ENCOUNTER — Encounter: Payer: Self-pay | Admitting: Family Medicine

## 2018-01-23 ENCOUNTER — Ambulatory Visit (INDEPENDENT_AMBULATORY_CARE_PROVIDER_SITE_OTHER): Payer: Medicare Other

## 2018-01-23 ENCOUNTER — Encounter: Payer: Self-pay | Admitting: Family Medicine

## 2018-01-23 ENCOUNTER — Telehealth: Payer: Self-pay | Admitting: Family Medicine

## 2018-01-23 ENCOUNTER — Ambulatory Visit (INDEPENDENT_AMBULATORY_CARE_PROVIDER_SITE_OTHER): Payer: Medicare Other | Admitting: Family Medicine

## 2018-01-23 ENCOUNTER — Ambulatory Visit: Payer: Self-pay | Admitting: *Deleted

## 2018-01-23 VITALS — BP 118/76 | HR 103 | Temp 98.3°F | Ht 69.0 in | Wt 163.2 lb

## 2018-01-23 DIAGNOSIS — J90 Pleural effusion, not elsewhere classified: Secondary | ICD-10-CM

## 2018-01-23 DIAGNOSIS — R0600 Dyspnea, unspecified: Secondary | ICD-10-CM

## 2018-01-23 DIAGNOSIS — R000 Tachycardia, unspecified: Secondary | ICD-10-CM

## 2018-01-23 DIAGNOSIS — J811 Chronic pulmonary edema: Secondary | ICD-10-CM | POA: Diagnosis not present

## 2018-01-23 DIAGNOSIS — F419 Anxiety disorder, unspecified: Secondary | ICD-10-CM

## 2018-01-23 NOTE — Telephone Encounter (Signed)
Pt is concerned that his heart rated is too fast and his b/p is too low . He is stating that his lowest  B/P is 98/58 and highest HR is 103. He states that he can not lie down to sleep because he feels his heart beating fast. No shortness of breath, palpitations, no chest pain or dizziness.  His latest readings are 103/63 and 89 heart rate. He thinks he needs to see a cardiologist.  He did not want to drive to [facility] at [facility], he is wishing to see a provider closer to him.  He is requesting to see a provider at [facility]. Appointment scheduled as requested today. No appropriate protocol noted. Advised to call back for any concerns. Routing to flow at [employer].   Additional Information . Commented on: Answer Assessment - Initial Assessment Questions    See telephone encounter  Protocols used: HEART RATE AND HEARTBEAT QUESTIONS-[facility]

## 2018-01-23 NOTE — Progress Notes (Signed)
Subjective:    Patient ID: George Dy., male    DOB: 08-24-1940, 77 y.o.   MRN: [id number]  HPI  Patient presents to clinic due to concerns over BP being too low and HR being too high. States his BP has been in 90s/50s & 100s/60s and HR around 103  Patient states he has been feeling short of breath, having episodes of dizziness/lightheadedness, off and on for the past 2 weeks.  Symptoms seem worse at night, heels even more anxious and is unable to sleep.  Patient recently was taken off of his Cozaar and hydrochlorothiazide back and mid October due to low BP readings.  Patient denies any history of heart attack as far as he knows  Patient is a smoker, half pack per day.  Patient also has chronic kidney disease, most recent GFR done in October 2019 is 39.34  Patient Active Problem List   Diagnosis Date Noted  . Chronic left SI joint pain 07/12/2017  . Sciatic pain, left 06/14/2017  . Insomnia 04/01/2015  . Chronic left-sided low back pain with left-sided sciatica 03/03/2015  . Arthritis 11/11/2014  . Anemia, iron deficiency 09/11/2012  . Chronic kidney disease 07/09/2012  . Essential hypertension 04/27/2011   Social History   Tobacco Use  . Smoking status: Current Every Day Smoker    Packs/day: 0.50    Types: Cigarettes  . Smokeless tobacco: Never Used  . Tobacco comment: pt refused materials  Substance Use Topics  . Alcohol use: Yes    Alcohol/week: 0.0 standard drinks    Comment: beer or wine occassionally   Review of Systems   Constitutional: Negative for chills, fatigue and fever.  HENT: Negative for congestion, ear pain, sinus pain and sore throat.   Eyes: Negative.   Respiratory: +SOB. Negative for cough, and wheezing.   Cardiovascular: Negative for chest pain, palpitations and leg swelling. +fast heart beat  Gastrointestinal: Negative for abdominal pain, diarrhea, nausea and vomiting.  Genitourinary: Negative for dysuria, frequency and urgency.    Musculoskeletal: Negative for arthralgias and myalgias.  Skin: Negative for color change, pallor and rash.  Neurological: Negative for syncope, light-headedness and headaches.  Psychiatric/Behavioral: The patient is nervous/anxious.       Objective:   Physical Exam  Constitutional: He is oriented to person, place, and time. No distress.  HENT:  Head: Normocephalic and atraumatic.  Eyes: Conjunctivae and EOM are normal. No scleral icterus.  Neck: Neck supple. No JVD present. No tracheal deviation present.  Cardiovascular: Regular rhythm and normal heart sounds.  Mild tachycardia at 103  ekg rate is 97  Pulmonary/Chest: Effort normal and breath sounds normal. No stridor. No respiratory distress. He has no wheezes. He has no rales.  Musculoskeletal:  Gait normal  Trace edema bilat LEs  Neurological: He is alert and oriented to person, place, and time.  Skin: Skin is warm and dry. He is not diaphoretic. No pallor.  Psychiatric: His behavior is normal.  Is anxious about what could be wrong      Vitals:   01/23/18 1605  BP: 118/76  Pulse: (!) 103  Temp: 98.3 F (36.8 C)  SpO2: 93%    Assessment & Plan:    A total of 45  minutes were spent face-to-face with the patient during this encounter and over half of that time was spent on counseling and coordination of care. The patient was counseled on EKG results, plan for lab/cxr, going to cardiology to get ZO patch on.  Tachycardia, dyspnea-EKG was done in clinic.  EKG reviewed by me & did show abnormalities including ST depressions in multiple leads.  Cardiology was called, Dr. Rockey Situ reviewed EKG and does not believe it to be toxic in appearance.  He thinks there is a left bundle branch block present.  Dr. Rockey Situ also suggested that due to patient's symptoms of feeling his heartbeat is fast, and minor shortness of breath that we can have him come in to get a ZO monitor placed to monitor his heart rate for the next 2 weeks.   Patient and wife both agreeable to this plan.  Patient also will get CBC, CMP in clinic today and chest x-ray report he leaves.  Patient will keep a log of his blood pressure readings at home as well as heart rate, he has a BP cuff.  Patient aware we will call him tomorrow with instructions on where to go to get the ZO monitor placed.  Patient understands that if his symptoms worsen, he develops chest pain, becomes diaphoretic, shortness of breath increases he should go to the emergency room right away for evaluation.

## 2018-01-23 NOTE — Patient Instructions (Signed)
Spoke with Dr Rockey Situ -- come to clinic to get ZO patch put on tomorrow

## 2018-01-23 NOTE — Telephone Encounter (Signed)
FYI. Appt. scheduled with Lauren.

## 2018-01-23 NOTE — Telephone Encounter (Signed)
Patient would like to transfer care to this office due to [facility] being too far away

## 2018-01-24 ENCOUNTER — Telehealth: Payer: Self-pay | Admitting: *Deleted

## 2018-01-24 ENCOUNTER — Telehealth: Payer: Self-pay

## 2018-01-24 ENCOUNTER — Encounter: Payer: Self-pay | Admitting: Family Medicine

## 2018-01-24 DIAGNOSIS — R000 Tachycardia, unspecified: Secondary | ICD-10-CM

## 2018-01-24 LAB — COMPREHENSIVE METABOLIC PANEL
ALK PHOS: 57 U/L (ref 39–117)
ALT: 14 U/L (ref 0–53)
AST: 16 U/L (ref 0–37)
Albumin: 4.1 g/dL (ref 3.5–5.2)
BILIRUBIN TOTAL: 0.5 mg/dL (ref 0.2–1.2)
BUN: 22 mg/dL (ref 6–23)
CALCIUM: 9.9 mg/dL (ref 8.4–10.5)
CO2: 28 mEq/L (ref 19–32)
CREATININE: 1.63 mg/dL — AB (ref 0.40–1.50)
Chloride: 90 mEq/L — ABNORMAL LOW (ref 96–112)
GFR: 43.82 mL/min — AB (ref >60.00)
Glucose, Bld: 117 mg/dL — ABNORMAL HIGH (ref 70–99)
Potassium: 4.1 mEq/L (ref 3.5–5.1)
Sodium: 127 mEq/L — ABNORMAL LOW (ref 135–145)
TOTAL PROTEIN: 7.3 g/dL (ref 6.0–8.3)

## 2018-01-24 LAB — CBC WITH DIFFERENTIAL/PLATELET
BASOS ABS: 0 10*3/uL (ref 0.0–0.1)
Basophils Relative: 0.5 % (ref 0.0–3.0)
Eosinophils Absolute: 0.3 10*3/uL (ref 0.0–0.7)
Eosinophils Relative: 5.9 % — ABNORMAL HIGH (ref 0.0–5.0)
HEMATOCRIT: 34.1 % — AB (ref 39.0–52.0)
Hemoglobin: 11.1 g/dL — ABNORMAL LOW (ref 13.0–17.0)
Lymphocytes Relative: 5.4 % — ABNORMAL LOW (ref 12.0–46.0)
Lymphs Abs: 0.3 10*3/uL — ABNORMAL LOW (ref 0.7–4.0)
MCHC: 32.6 g/dL (ref 30.0–36.0)
MCV: 83.9 fl (ref 78.0–100.0)
Monocytes Absolute: 0.5 10*3/uL (ref 0.1–1.0)
Monocytes Relative: 8.1 % (ref 3.0–12.0)
Neutro Abs: 4.5 10*3/uL (ref 1.4–7.7)
Neutrophils Relative %: 80.1 % — ABNORMAL HIGH (ref 43.0–77.0)
Platelets: 253 10*3/uL (ref 150.0–400.0)
RBC: 4.07 Mil/uL — AB (ref 4.22–5.81)
RDW: 17.1 % — ABNORMAL HIGH (ref 11.5–15.5)
WBC: 5.6 10*3/uL (ref 4.0–10.5)

## 2018-01-24 LAB — IRON,TIBC AND FERRITIN PANEL
%SAT: 9 % (calc) — ABNORMAL LOW (ref 20–48)
Ferritin: 55 ng/mL (ref 24–380)
Iron: 32 ug/dL — ABNORMAL LOW (ref 50–180)
TIBC: 356 mcg/dL (calc) (ref 250–425)

## 2018-01-24 MED ORDER — ALPRAZOLAM 0.25 MG PO TABS: 0.2500 mg | ORAL_TABLET | Freq: Two times a day (BID) | ORAL | 0 refills | Status: AC | PRN

## 2018-01-24 MED ORDER — FUROSEMIDE 20 MG PO TABS: 20.0000 mg | ORAL_TABLET | Freq: Every day | ORAL | 0 refills | Status: DC

## 2018-01-24 NOTE — Progress Notes (Unsigned)
Lasix Rx sent in to take x2 days  Xanax sent in

## 2018-01-24 NOTE — Telephone Encounter (Signed)
Call received from [facility] PCP yesterday that the patient was having complaints of dizziness and there was an abnormal EKG.  Per Lauren NP, the patient denies chest pain. He is not currently a patient at this office.   Per Dr. Rockey Situ, the patient may wear a 14 day Zio Monitor. He has an appointment 11/14 to have this placed. The patient has verbalized his understanding.

## 2018-01-24 NOTE — Telephone Encounter (Signed)
Copied from [place] [phone]. Topic: General - Other >> Jan 24, 2018  9:00 AM Carolyn Stare wrote:  Pt said there was suppose to be a RX called in for him to [facility] for anxiety and he contacted them last night and they had not received a RX

## 2018-01-24 NOTE — Telephone Encounter (Signed)
For some reason, I did not receive this message.  I have been away from my phone all afternoon and just now received his wife's voicemail on my cell phone ( 10 pm) stating that he is requesting xanax for anxiety.  In reviewing his chart, unfortunately it appears he has had two weeks of cardiac symptoms and has established with another provider.  I am very sorry that I did not receive this message until now but it seems he receiving excellent care.  I cannot call in a controlled substance now that he has a new PCP.  He is very nice man and I wish him well.  I reviewed his chart and he is set up with cardiology.  Very kind man with and family.  I would like for [place] to check on him and to find out how I did not receive his original message.  Thank you. Dr. Deborra Medina

## 2018-01-25 ENCOUNTER — Other Ambulatory Visit: Payer: Self-pay

## 2018-01-25 ENCOUNTER — Telehealth: Payer: Self-pay

## 2018-01-25 ENCOUNTER — Ambulatory Visit: Payer: Medicare Other

## 2018-01-25 ENCOUNTER — Ambulatory Visit (INDEPENDENT_AMBULATORY_CARE_PROVIDER_SITE_OTHER): Payer: Medicare Other

## 2018-01-25 ENCOUNTER — Encounter: Payer: Self-pay | Admitting: Nurse Practitioner

## 2018-01-25 ENCOUNTER — Ambulatory Visit (INDEPENDENT_AMBULATORY_CARE_PROVIDER_SITE_OTHER): Payer: Medicare Other | Admitting: Nurse Practitioner

## 2018-01-25 ENCOUNTER — Telehealth: Payer: Self-pay | Admitting: *Deleted

## 2018-01-25 VITALS — BP 116/68 | HR 96 | Ht 69.0 in | Wt 160.5 lb

## 2018-01-25 DIAGNOSIS — R0602 Shortness of breath: Secondary | ICD-10-CM

## 2018-01-25 DIAGNOSIS — R011 Cardiac murmur, unspecified: Secondary | ICD-10-CM

## 2018-01-25 DIAGNOSIS — R000 Tachycardia, unspecified: Secondary | ICD-10-CM

## 2018-01-25 DIAGNOSIS — I5021 Acute systolic (congestive) heart failure: Secondary | ICD-10-CM

## 2018-01-25 DIAGNOSIS — I340 Nonrheumatic mitral (valve) insufficiency: Secondary | ICD-10-CM | POA: Diagnosis not present

## 2018-01-25 DIAGNOSIS — I350 Nonrheumatic aortic (valve) stenosis: Secondary | ICD-10-CM

## 2018-01-25 DIAGNOSIS — N183 Chronic kidney disease, stage 3 unspecified: Secondary | ICD-10-CM

## 2018-01-25 MED ORDER — FUROSEMIDE 40 MG PO TABS: 40.0000 mg | ORAL_TABLET | Freq: Every day | ORAL | 3 refills | Status: AC

## 2018-01-25 NOTE — Telephone Encounter (Signed)
Cancelled Lab appt

## 2018-01-25 NOTE — Telephone Encounter (Signed)
Please cancel lab appt. Lab appt not needed when pt has an office visit the same day.

## 2018-01-25 NOTE — Telephone Encounter (Signed)
Pt coming in for labs tomorrow (01/26/18).  Please place future orders

## 2018-01-25 NOTE — Progress Notes (Signed)
Cardiology Clinic Note   Patient Name: George Washington. Date of Encounter: 01/25/2018  Primary Care Provider:  Jodelle Green, FNP Primary Cardiologist:  Pt will f/u with C. End, MD   Patient Profile    77 year old male with a history of stage III chronic kidney disease, tobacco abuse, iron deficiency anemia, and hypertension, who presents for new patient evaluation in the setting of a 3-week history of progressive dyspnea on exertion, weight gain, and lower extremity edema.    Past Medical History    Past Medical History:  Diagnosis Date  . Arthritis   . CKD (chronic kidney disease), stage III ([place])   . HTN (hypertension)   . Narcotic dependence, in remission ([place])   . Retinal vein occlusion   . Tobacco abuse    Past Surgical History:  Procedure Laterality Date  . cyst chest  1994   left  . EYE SURGERY    . rectal fissure  1968  . vro      Allergies  Allergies  Allergen Reactions  . Lisinopril     cough    History of Present Illness    77 year old male with the above past medical history including stage III chronic kidney disease which is followed by nephrology.  He also has a prior history of hypertension and was previously treated with losartan-HCTZ, iron deficiency anemia, and tobacco abuse.  He was in his usage of health until mid October, when he began to notice lower blood pressures and also orthostatic lightheadedness.  He was seen by primary care on October 16 and hydrochlorothiazide therapy was discontinued.  Unfortunately, low blood pressures persisted and beginning about 3 weeks ago, he also began to note elevated heart rates and onset of increasing dyspnea on exertion, lower extremity swelling, early satiety, and orthopnea.  His nephrologist discontinued his losartan in the setting of soft blood pressures and with this, blood pressures improved however, dyspnea, orthopnea, and edema progressed.  He says his usual weight at home is about 150 pounds.  Because  depressive symptoms, he was seen by primary care on November 12, and was found to be in sinus tachycardia with a new left bundle branch block.  Per note, no murmurs were heard and he had trace lower extremity edema.  Of note, his weight at that time was 163 pounds, roughly 12 pounds above where he is accustomed to running.  He was given a prescription for Lasix 20 mg x 2 days and in the setting of elevated heart rates, it was felt that he may require a Zio monitor.  A chest x-ray was performed and showed CHF with bibasilar interstitial edema and pleural effusions.  Patient presented to cardiology clinic today to pick up his Zio monitor and after discussion with our nurse, he was added to my schedule out of concern for progressive heart failure symptoms.  With the exception of orthopnea, he has not had any symptoms at rest.  He denies any prior history of chest pain and says he has never been told that he has loud systolic murmurs, which were found on exam today.  He and his wife eat out occasionally but overall, they are careful with salt intake.  Since being placed on Lasix, he has not noticed any significant improvement in symptoms though his weight is down 2 pounds.  Home Medications    Prior to Admission medications   Medication Sig Start Date End Date Taking? Authorizing Provider  ALPRAZolam Duanne Moron) 0.25 MG tablet Take  1 tablet (0.25 mg total) by mouth 2 (two) times daily as needed for anxiety. 01/24/18  Yes Guse, Jacquelynn Cree, FNP  aspirin-acetaminophen-caffeine (EXCEDRIN MIGRAINE) [phone] MG per tablet Take 1 tablet by mouth every 6 (six) hours as needed.   Yes [provider]  carisoprodol (SOMA) 350 MG tablet Take 1 tablet (350 mg total) by mouth 3 (three) times daily as needed for muscle spasms. 08/17/17  Yes Lucille Passy, MD  gabapentin (NEURONTIN) 100 MG capsule Take 1 capsule (100 mg total) by mouth 3 (three) times daily. 11/06/17  Yes Rosemarie Ax, MD  meloxicam (MOBIC) 15 MG  tablet TAKE ONE TABLET BY MOUTH DAILY 12/07/17  Yes Lucille Passy, MD  Multiple Vitamins-Minerals (CENTRUM SILVER PO) Take 1 tablet by mouth daily.   Yes [provider]  traMADol (ULTRAM) 50 MG tablet Take 1 tablet (50 mg total) by mouth every 8 (eight) hours as needed. 09/27/17  Yes Lucille Passy, MD  traZODone (DESYREL) 50 MG tablet TAKE 0.5-1 TABLET EVERY NIGHT AT BEDTIME AS NEEDED FOR SLEEP 12/12/17  Yes Lucille Passy, MD  furosemide (LASIX) 40 MG tablet Take 1 tablet (40 mg total) by mouth daily. 01/25/18 04/25/18  Theora Gianotti, NP    Family History    Family History  Problem Relation Age of Onset  . Hyperlipidemia Father   . Hypertension Father   . Heart attack Father   . Stroke Father        died @ 59  . Heart disease Mother        died @ 58  . High Cholesterol Brother   . High blood pressure Brother   . Heart disease Sister 80       MI, CABG  . High Cholesterol Sister   . High blood pressure Sister   . Colon cancer Neg Hx   . Stomach cancer Neg Hx    He indicated that his mother is deceased. He indicated that his father is deceased. He indicated that the status of his sister is unknown. He indicated that his brother is alive. He indicated that the status of his neg hx is unknown.  Social History    Social History   Socioeconomic History  . Marital status: Married    Spouse name: Not on file  . Number of children: 2  . Years of education: Not on file  . Highest education level: Not on file  Occupational History  . Occupation: Scientist, research (physical sciences): [employer]    Comment: RETIRED  Social Needs  . Financial resource strain: Not on file  . Food insecurity:    Worry: Not on file    Inability: Not on file  . Transportation needs:    Medical: Not on file    Non-medical: Not on file  Tobacco Use  . Smoking status: Current Every Day Smoker    Packs/day: 0.50    Types: Cigarettes  . Smokeless tobacco: Never Used  . Tobacco comment:  pt refused materials  Substance and Sexual Activity  . Alcohol use: Yes    Alcohol/week: 0.0 standard drinks    Comment: beer or wine occassionally  . Drug use: No  . Sexual activity: Yes  Lifestyle  . Physical activity:    Days per week: Not on file    Minutes per session: Not on file  . Stress: Not on file  Relationships  . Social connections:    Talks on phone: Not on file  Gets together: Not on file    Attends religious service: Not on file    Active member of club or organization: Not on file    Attends meetings of clubs or organizations: Not on file    Relationship status: Not on file  . Intimate partner violence:    Fear of current or ex partner: Not on file    Emotionally abused: Not on file    Physically abused: Not on file    Forced sexual activity: Not on file  Other Topics Concern  . Not on file  Social History Narrative   Lives locally with wife Baker Janus Alvi.   Does not routinely exercise.   Retired Software engineer (retail).   He does attend NA meetings.      Does have a living will- does not want prolonged life support if futile.     Review of Systems    General:  No chills, fever, night sweats or weight changes.  Cardiovascular:  No chest pain, +++ dyspnea on exertion, +++ edema, +++ orthopnea, no palpitations, paroxysmal nocturnal dyspnea. Dermatological: No rash, lesions/masses Respiratory: No cough, +++ dyspnea Urologic: No hematuria, dysuria Abdominal:   +++ Early satiety/Anorexia.  No nausea, vomiting, diarrhea, bright red blood per rectum, melena, or hematemesis Neurologic:  No visual changes, wkns, changes in mental status. All other systems reviewed and are otherwise negative except as noted above.  Physical Exam    VS:  BP 116/68 (BP Location: Left Arm, Patient Position: Sitting, Cuff Size: Normal)   Pulse 96   Ht 5\' 9"  (1.753 m)   Wt 160 lb 8 oz (72.8 kg)   BMI 23.70 kg/m  , BMI Body mass index is 23.7 kg/m. GEN: Well nourished, well  developed, in no acute distress. HEENT: normal. Neck: Supple, JVP approximately 12-14 cm, no carotid bruits, or masses. Cardiac: RRR, 3/6 harsh systolic ejection murmur at the upper sternal borders.  I do not appreciate an S2.  3/6 blowing quality systolic murmur at the apex which radiates to the axilla and left mid back.  Positive S4.  No rubs. No clubbing, cyanosis.  2+ bilateral lower leg edema with 1+ posterior upper leg edema extending to the mid thighs.  Radials/DP/PT 2+ and equal bilaterally.  Respiratory:  Respirations regular and unlabored, clear to auscultation bilaterally. GI: Soft, nontender, nondistended, BS + x 4. MS: no deformity or atrophy. Skin: warm and dry, no rash. Neuro:  Strength and sensation are intact. Psych: Normal affect.  Accessory Clinical Findings    ECG personally reviewed by me today-regular sinus rhythm, 96, left atrial enlargement, left bundle branch block with lateral T wave inversion.  This is similar to November 12 ECG but new since 2013.  Lab Results  Component Value Date   CREATININE 1.63 (H) 01/23/2018   BUN 22 01/23/2018   NA 127 (L) 01/23/2018   K 4.1 01/23/2018   CL 90 (L) 01/23/2018   CO2 28 01/23/2018   Lab Results  Component Value Date   WBC 5.6 01/23/2018   HGB 11.1 (L) 01/23/2018   HCT 34.1 (L) 01/23/2018   MCV 83.9 01/23/2018   PLT 253.0 01/23/2018   Lab Results  Component Value Date   CHOL 200 12/27/2017   HDL 64.70 12/27/2017   LDLCALC 117 (H) 12/27/2017   TRIG 90.0 12/27/2017   CHOLHDL 3 12/27/2017     Assessment & Plan   1.  Acute systolic congestive heart failure: Patient without prior cardiac history presents with a 3-week history  of progressive dyspnea on exertion, lower extremity swelling, elevated heart rates (sinus tachycardia), orthopnea, and early satiety.  Of note, his weight also went up about 12 pounds over the course of the past month.  He was seen in the primary care clinic on November 12 and was noted to  have a new left bundle branch block with sinus tachycardia.  He was felt to have mild volume overload and chest x-ray showed CHF.  Was placed on Lasix 20 mg daily.  He did not have much of a response to Lasix though he does have a history of stage III chronic kidney disease and thus is likely underdosed.  His weight is down 2 pounds today.  He is markedly volume overloaded on exam with JVD, an S4, and at least 2+ lower extremity edema.  He also has loud murmurs of aortic and mitral valve areas.  He is unaware of any prior history of murmurs.  We were able to obtain an echocardiogram immediately following his clinic visit and as feared, he has systolic dysfunction with an EF of approximately 30% by preliminary read along with severe aortic stenosis and mitral regurgitation.  Prior to the echocardiogram, we agreed that he would increase Lasix to 40 mg daily with a plan for office follow-up next Wednesday to reevaluate lab work as it was likely that he would require right and left heart diagnostic cardiac catheterization.  Following echo findings, I discussed his case with Dr. Rockey Situ.  Given relative stability, we do not feel that he requires admission.  I counseled the patient and his wife that if he does not have adequate response to Lasix in the next 48 hours or at any point has worsening symptoms, we will need to admit him for IV diuresis and close monitoring of renal function in the setting of stage III kidney disease.  Patient verbalized understanding.  With history of soft blood pressures and current appearance of low output heart failure, I am not adding a beta-blocker.  No ARB/ACE/Entresto/MRA in the setting of stage III chronic kidney disease with need for diuresis and eventual contrast at the time of diagnostic catheterization.  2.  Severe aortic stenosis: Noted on preliminary read of echocardiogram today.  As above, he will require right and left heart diagnostic catheterization to assess for coronary  artery disease.  With LV dysfunction, he may not be a suitable candidate for SAVR and may eventually require referral to our structural heart clinic.  3.  Severe mitral regurgitation: In the setting of above.  Unclear if this is functional at this point.  As above will require a left heart cardiac catheterization and further cardiothoracic consultation.  4.  Tobacco abuse: Complete cessation advised.  He still smoking 1/2 pack a day.  5.  Stage III chronic kidney disease: Creatinine stable at 1.63 on recent evaluation on November 12.  Potassium 4.1 at that time.  He will require follow-up basic metabolic panel when seen next Wednesday.  6.  Disposition: We will obtain formal read on echocardiogram this evening.  Patient will follow-up in just under 1 week or sooner if necessary.  Murray Hodgkins, NP 01/25/2018, 5:35 PM

## 2018-01-25 NOTE — Telephone Encounter (Signed)
TA-I spoke with wife Valarie Cones said that he is getting a holter monitor today/the Dx came as a shock to them but was told that the results from the EKG were "non-toxic" so they feel better/she said that she understands that MyChart is not meant for urgent issues and she said "you know how men are I couldn't get him to go to the urgent care or the ED.  I never would have thought that he had the diagnosis that he got because there was none of the swelling or anything that comes with it."/she said that she is happy with the way things are going and will await call from PCP Guse about appt to Cards/thx dmf

## 2018-01-25 NOTE — Telephone Encounter (Signed)
Thank you so much for speaking with her.  I tried to call her early this morning and was unable to reach her.

## 2018-01-25 NOTE — Telephone Encounter (Signed)
Hes coming in for office visit at 21, and will get labs too

## 2018-01-25 NOTE — Patient Instructions (Signed)
Medication Instructions:  Your physician has recommended you make the following change in your medication:  1- INCREASE Furosemide to 40 mg by mouth once a day.  If you need a refill on your cardiac medications before your next appointment, please call your pharmacy.   Lab work: none If you have labs (blood work) drawn today and your tests are completely normal, you will receive your results only by: Marland Kitchen MyChart Message (if you have MyChart) OR . A paper copy in the mail If you have any lab test that is abnormal or we need to change your treatment, we will call you to review the results.  Testing/Procedures: Your physician has requested that you have an echocardiogram. Echocardiography is a painless test that uses sound waves to create images of your heart. It provides your doctor with information about the size and shape of your heart and how well your heart's chambers and valves are working. This procedure takes approximately one hour. There are no restrictions for this procedure. You may get an IV, if needed, to receive an ultrasound enhancing agent through to better visualize your heart.     Follow-Up: At [facility], you and your health needs are our priority.  As part of our continuing mission to provide you with exceptional heart care, we have created designated Provider Care Teams.  These Care Teams include your primary Cardiologist (physician) and Advanced Practice Providers (APPs -  Physician Assistants and Nurse Practitioners) who all work together to provide you with the care you need, when you need it. You will need a follow up appointment in 1 weeks.    You may see DR Harrell Gave END.

## 2018-01-26 ENCOUNTER — Telehealth: Payer: Self-pay

## 2018-01-26 ENCOUNTER — Ambulatory Visit: Payer: Medicare Other | Admitting: Family Medicine

## 2018-01-26 ENCOUNTER — Other Ambulatory Visit: Payer: Medicare Other

## 2018-01-26 LAB — ECHOCARDIOGRAM COMPLETE
Height: 69 in
Weight: 2568 oz

## 2018-01-26 NOTE — Telephone Encounter (Signed)
That is fine, I am glad he got seen by cardiology so quickly  He did say he wants to transfer to this office, so please have him schedule a follow up appt in 2-3 months here

## 2018-01-26 NOTE — Progress Notes (Deleted)
   Subjective:    Patient ID: George Washington., male    DOB: 1940/10/02, 77 y.o.   MRN: [id number]  HPI  Patient presents to clinic to follow up on heart failure, dyspnea, LE swelling  He saw cardiology yesterday - C. Barge NP, I was able to review his note - Echo was done and prelim report reveals acute systolic CHF, Severe Aortic Stenosis, & Severe mitral valve regurgitation.   He did not notice much difference with 20 mg lasix, so cardiology increased dose to 40mg  per day   Patient Active Problem List   Diagnosis Date Noted  . Chronic left SI joint pain 07/12/2017  . Sciatic pain, left 06/14/2017  . Insomnia 04/01/2015  . Chronic left-sided low back pain with left-sided sciatica 03/03/2015  . Arthritis 11/11/2014  . Anemia, iron deficiency 09/11/2012  . Chronic kidney disease 07/09/2012  . Essential hypertension 04/27/2011   Social History   Tobacco Use  . Smoking status: Current Every Day Smoker    Packs/day: 0.50    Types: Cigarettes  . Smokeless tobacco: Never Used  . Tobacco comment: pt refused materials  Substance Use Topics  . Alcohol use: Yes    Alcohol/week: 0.0 standard drinks    Comment: beer or wine occassionally   Review of Systems     Objective:   Physical Exam        Assessment & Plan:

## 2018-01-26 NOTE — Telephone Encounter (Signed)
Called Pt to schedule an appt for him in 2-3 months from now with Philis Nettle, no answer left a VM

## 2018-01-26 NOTE — Telephone Encounter (Signed)
Patient called to cancel appointment scheduled for TODAY. Patient has not rescheduled their appointment.---pt was seen by Cardiology. pt says that he dont feel that this apt is needed

## 2018-01-26 NOTE — Telephone Encounter (Signed)
Copied from [place] [phone]. Topic: Quick Communication - Appointment Cancellation >> Jan 26, 2018 10:03 AM Antonieta Iba C wrote: Patient called to cancel appointment scheduled for TODAY. Patient has not rescheduled their appointment.---pt was seen by Cardiology. pt says that he dont feel that this apt is needed.   Route to department's PEC pool.

## 2018-01-29 ENCOUNTER — Other Ambulatory Visit
Admission: RE | Admit: 2018-01-29 | Discharge: 2018-01-29 | Disposition: A | Discharge status: Home / Self Care | Payer: Medicare Other | Source: Ambulatory Visit | Attending: Nurse Practitioner | Admitting: Nurse Practitioner

## 2018-01-29 ENCOUNTER — Other Ambulatory Visit: Payer: Self-pay | Admitting: Nurse Practitioner

## 2018-01-29 DIAGNOSIS — N183 Chronic kidney disease, stage 3 unspecified: Secondary | ICD-10-CM

## 2018-01-29 LAB — BASIC METABOLIC PANEL
Anion gap: 10 (ref 5–15)
BUN: 30 mg/dL — ABNORMAL HIGH (ref 8–23)
CALCIUM: 10 mg/dL (ref 8.9–10.3)
CO2: 33 mmol/L — AB (ref 22–32)
CREATININE: 1.67 mg/dL — AB (ref 0.61–1.24)
Chloride: 88 mmol/L — ABNORMAL LOW (ref 98–111)
GFR calc Af Amer: 44 mL/min — ABNORMAL LOW (ref >60)
GFR calc non Af Amer: 38 mL/min — ABNORMAL LOW (ref >60)
Glucose, Bld: 131 mg/dL — ABNORMAL HIGH (ref 70–99)
Potassium: 4.2 mmol/L (ref 3.5–5.1)
Sodium: 131 mmol/L — ABNORMAL LOW (ref 135–145)

## 2018-01-30 ENCOUNTER — Ambulatory Visit: Payer: Medicare Other | Admitting: Cardiovascular Disease

## 2018-01-31 ENCOUNTER — Ambulatory Visit (INDEPENDENT_AMBULATORY_CARE_PROVIDER_SITE_OTHER): Payer: Medicare Other | Admitting: Internal Medicine

## 2018-01-31 ENCOUNTER — Encounter: Payer: Self-pay | Admitting: Internal Medicine

## 2018-01-31 ENCOUNTER — Inpatient Hospital Stay
Admission: RE | Admit: 2018-01-31 | Discharge: 2018-02-05 | DRG: 286 | Disposition: A | Discharge status: Other Acute Inpatient Hospital | Payer: Medicare Other | Source: Ambulatory Visit | Attending: Internal Medicine | Admitting: Internal Medicine

## 2018-01-31 ENCOUNTER — Other Ambulatory Visit: Payer: Self-pay

## 2018-01-31 ENCOUNTER — Encounter: Payer: Self-pay | Admitting: Specialist

## 2018-01-31 VITALS — BP 94/60 | HR 93 | Ht 69.0 in | Wt 157.5 lb

## 2018-01-31 DIAGNOSIS — E875 Hyperkalemia: Secondary | ICD-10-CM | POA: Diagnosis present

## 2018-01-31 DIAGNOSIS — I5021 Acute systolic (congestive) heart failure: Secondary | ICD-10-CM | POA: Insufficient documentation

## 2018-01-31 DIAGNOSIS — I350 Nonrheumatic aortic (valve) stenosis: Secondary | ICD-10-CM

## 2018-01-31 DIAGNOSIS — N184 Chronic kidney disease, stage 4 (severe): Secondary | ICD-10-CM | POA: Diagnosis not present

## 2018-01-31 DIAGNOSIS — F1121 Opioid dependence, in remission: Secondary | ICD-10-CM | POA: Diagnosis present

## 2018-01-31 DIAGNOSIS — E876 Hypokalemia: Secondary | ICD-10-CM | POA: Diagnosis present

## 2018-01-31 DIAGNOSIS — Z8249 Family history of ischemic heart disease and other diseases of the circulatory system: Secondary | ICD-10-CM

## 2018-01-31 DIAGNOSIS — Z79899 Other long term (current) drug therapy: Secondary | ICD-10-CM | POA: Diagnosis not present

## 2018-01-31 DIAGNOSIS — I429 Cardiomyopathy, unspecified: Secondary | ICD-10-CM | POA: Diagnosis present

## 2018-01-31 DIAGNOSIS — K4090 Unilateral inguinal hernia, without obstruction or gangrene, not specified as recurrent: Secondary | ICD-10-CM

## 2018-01-31 DIAGNOSIS — I5023 Acute on chronic systolic (congestive) heart failure: Secondary | ICD-10-CM | POA: Diagnosis present

## 2018-01-31 DIAGNOSIS — N179 Acute kidney failure, unspecified: Secondary | ICD-10-CM | POA: Diagnosis not present

## 2018-01-31 DIAGNOSIS — F4310 Post-traumatic stress disorder, unspecified: Secondary | ICD-10-CM | POA: Diagnosis present

## 2018-01-31 DIAGNOSIS — I340 Nonrheumatic mitral (valve) insufficiency: Secondary | ICD-10-CM | POA: Diagnosis not present

## 2018-01-31 DIAGNOSIS — N183 Chronic kidney disease, stage 3 unspecified: Secondary | ICD-10-CM

## 2018-01-31 DIAGNOSIS — M1990 Unspecified osteoarthritis, unspecified site: Secondary | ICD-10-CM | POA: Diagnosis present

## 2018-01-31 DIAGNOSIS — I420 Dilated cardiomyopathy: Secondary | ICD-10-CM | POA: Diagnosis not present

## 2018-01-31 DIAGNOSIS — Z888 Allergy status to other drugs, medicaments and biological substances status: Secondary | ICD-10-CM | POA: Diagnosis not present

## 2018-01-31 DIAGNOSIS — I509 Heart failure, unspecified: Secondary | ICD-10-CM | POA: Diagnosis not present

## 2018-01-31 DIAGNOSIS — I2510 Atherosclerotic heart disease of native coronary artery without angina pectoris: Secondary | ICD-10-CM | POA: Diagnosis not present

## 2018-01-31 DIAGNOSIS — G629 Polyneuropathy, unspecified: Secondary | ICD-10-CM | POA: Diagnosis present

## 2018-01-31 DIAGNOSIS — I080 Rheumatic disorders of both mitral and aortic valves: Secondary | ICD-10-CM

## 2018-01-31 DIAGNOSIS — I5043 Acute on chronic combined systolic (congestive) and diastolic (congestive) heart failure: Secondary | ICD-10-CM | POA: Diagnosis not present

## 2018-01-31 DIAGNOSIS — F17210 Nicotine dependence, cigarettes, uncomplicated: Secondary | ICD-10-CM | POA: Diagnosis present

## 2018-01-31 DIAGNOSIS — F419 Anxiety disorder, unspecified: Secondary | ICD-10-CM | POA: Diagnosis not present

## 2018-01-31 DIAGNOSIS — K429 Umbilical hernia without obstruction or gangrene: Secondary | ICD-10-CM | POA: Diagnosis present

## 2018-01-31 DIAGNOSIS — I130 Hypertensive heart and chronic kidney disease with heart failure and stage 1 through stage 4 chronic kidney disease, or unspecified chronic kidney disease: Principal | ICD-10-CM | POA: Diagnosis present

## 2018-01-31 DIAGNOSIS — K4030 Unilateral inguinal hernia, with obstruction, without gangrene, not specified as recurrent: Secondary | ICD-10-CM | POA: Diagnosis present

## 2018-01-31 DIAGNOSIS — E871 Hypo-osmolality and hyponatremia: Secondary | ICD-10-CM | POA: Diagnosis present

## 2018-01-31 DIAGNOSIS — R0602 Shortness of breath: Secondary | ICD-10-CM | POA: Diagnosis present

## 2018-01-31 LAB — CBC
HEMATOCRIT: 33.3 % — AB (ref 39.0–52.0)
HEMOGLOBIN: 10.4 g/dL — AB (ref 13.0–17.0)
MCH: 26.4 pg (ref 26.0–34.0)
MCHC: 31.2 g/dL (ref 30.0–36.0)
MCV: 84.5 fL (ref 80.0–100.0)
Platelets: 202 10*3/uL (ref 150–400)
RBC: 3.94 MIL/uL — ABNORMAL LOW (ref 4.22–5.81)
RDW: 16.5 % — AB (ref 11.5–15.5)
WBC: 5.1 10*3/uL (ref 4.0–10.5)
nRBC: 0 % (ref 0.0–0.2)

## 2018-01-31 LAB — CREATININE, SERUM
Creatinine, Ser: 1.77 mg/dL — ABNORMAL HIGH (ref 0.61–1.24)
GFR calc Af Amer: 41 mL/min — ABNORMAL LOW (ref >60)
GFR calc non Af Amer: 35 mL/min — ABNORMAL LOW (ref >60)

## 2018-01-31 MED ORDER — SODIUM CHLORIDE 0.9 % IV SOLN: 250.0000 mL | INTRAVENOUS | Status: DC | PRN

## 2018-01-31 MED ORDER — ONDANSETRON HCL 4 MG/2ML IJ SOLN
4.0000 mg | Freq: Four times a day (QID) | INTRAMUSCULAR | Status: DC | PRN
  Administered 2018-02-02: 4 mg via INTRAVENOUS
  Filled 2018-01-31: qty 2

## 2018-01-31 MED ORDER — ACETAMINOPHEN 325 MG PO TABS
650.0000 mg | ORAL_TABLET | ORAL | Status: DC | PRN
  Administered 2018-01-31 – 2018-02-04 (×4): 650 mg via ORAL
  Filled 2018-01-31 (×4): qty 2

## 2018-01-31 MED ORDER — POTASSIUM CHLORIDE CRYS ER 20 MEQ PO TBCR
20.0000 meq | EXTENDED_RELEASE_TABLET | Freq: Every day | ORAL | Status: DC
  Administered 2018-01-31 – 2018-02-03 (×3): 20 meq via ORAL
  Filled 2018-01-31 (×4): qty 1

## 2018-01-31 MED ORDER — TRAZODONE HCL 50 MG PO TABS
50.0000 mg | ORAL_TABLET | Freq: Every evening | ORAL | Status: DC | PRN
  Administered 2018-01-31 – 2018-02-03 (×4): 50 mg via ORAL
  Filled 2018-01-31 (×5): qty 1

## 2018-01-31 MED ORDER — SODIUM CHLORIDE 0.9% FLUSH: 3.0000 mL | INTRAVENOUS | Status: DC | PRN

## 2018-01-31 MED ORDER — ADULT MULTIVITAMIN W/MINERALS CH
1.0000 | ORAL_TABLET | Freq: Every day | ORAL | Status: DC
  Administered 2018-01-31 – 2018-02-05 (×6): 1 via ORAL
  Filled 2018-01-31 (×6): qty 1

## 2018-01-31 MED ORDER — FUROSEMIDE 10 MG/ML IJ SOLN
40.0000 mg | Freq: Two times a day (BID) | INTRAMUSCULAR | Status: DC
  Administered 2018-01-31 – 2018-02-02 (×4): 40 mg via INTRAVENOUS
  Filled 2018-01-31 (×4): qty 4

## 2018-01-31 MED ORDER — ENOXAPARIN SODIUM 40 MG/0.4ML ~~LOC~~ SOLN
40.0000 mg | SUBCUTANEOUS | Status: DC
  Administered 2018-01-31 – 2018-02-04 (×5): 40 mg via SUBCUTANEOUS
  Filled 2018-01-31 (×5): qty 0.4

## 2018-01-31 MED ORDER — ALPRAZOLAM 0.5 MG PO TABS
0.2500 mg | ORAL_TABLET | Freq: Two times a day (BID) | ORAL | Status: DC | PRN
  Administered 2018-01-31 – 2018-02-01 (×2): 0.25 mg via ORAL
  Filled 2018-01-31 (×2): qty 1

## 2018-01-31 MED ORDER — GABAPENTIN 100 MG PO CAPS
100.0000 mg | ORAL_CAPSULE | Freq: Three times a day (TID) | ORAL | Status: DC
  Administered 2018-01-31 – 2018-02-05 (×14): 100 mg via ORAL
  Filled 2018-01-31 (×14): qty 1

## 2018-01-31 MED ORDER — SODIUM CHLORIDE 0.9% FLUSH
3.0000 mL | Freq: Two times a day (BID) | INTRAVENOUS | Status: DC
  Administered 2018-01-31: 3 mL via INTRAVENOUS
  Administered 2018-02-01: 10 mL via INTRAVENOUS
  Administered 2018-02-01 – 2018-02-05 (×8): 3 mL via INTRAVENOUS

## 2018-01-31 MED ORDER — TRAMADOL HCL 50 MG PO TABS
50.0000 mg | ORAL_TABLET | Freq: Two times a day (BID) | ORAL | Status: DC | PRN
  Administered 2018-01-31: 50 mg via ORAL
  Filled 2018-01-31: qty 1

## 2018-01-31 NOTE — H&P (Signed)
[facility] NAME: George Washington    MR#:  [id number]  DATE OF BIRTH:  Jan 15, 1941  DATE OF ADMISSION:  01/31/2018  PRIMARY CARE PHYSICIAN: Jodelle Green, FNP   REQUESTING/REFERRING PHYSICIAN: Dr. Harrell Gave End  CHIEF COMPLAINT:  No chief complaint on file. Shortness of breath, LE edema.   HISTORY OF PRESENT ILLNESS:  George Washington  is a 77 y.o. male with a known history of hypertension, iron deficiency anemia, chronic kidney disease stage III, osteoarthritis, aortic stenosis, recently diagnosed cardiomyopathy/systolic congestive heart failure who was directly admitted from the cardiologist's office due to worsening lower extremity edema, shortness of breath on minimal exertion and noted to be in CHF.  Patient says about 10 days to 2 weeks ago patient developed some shortness of breath on minimal exertion and also worsening lower extremity edema.  Patient's primary care physician referred him to see a cardiologist, who performed an echocardiogram and started on some diuretics and today he had his follow-up after his echocardiogram and was noted to have an ejection fraction of 25 to 30% and remains volume overloaded and short of breath on minimal exertion and therefore hospitalist services were contacted for direct admission for further diuresis.  Patient denies any chest pains, nausea, vomiting, abdominal pain, melena, hematochezia or any other associated symptoms.  Patient does admit to some paroxysmal nocturnal dyspnea and 2-3 pillow orthopnea over the past week or so.  PAST MEDICAL HISTORY:   Past Medical History:  Diagnosis Date  . Aortic stenosis   . Arthritis   . CKD (chronic kidney disease), stage III ([place])   . HTN (hypertension)   . Iron deficiency anemia   . Mitral regurgitation   . Narcotic dependence, in remission ([place])   . Retinal vein occlusion   . Systolic heart failure ([place]) 01/2018  . Tobacco abuse     PAST SURGICAL  HISTORY:   Past Surgical History:  Procedure Laterality Date  . cyst chest  1994   left  . EYE SURGERY    . rectal fissure  1968  . vro      SOCIAL HISTORY:   Social History   Tobacco Use  . Smoking status: Current Every Day Smoker    Packs/day: 0.50    Years: 30.00    Pack years: 15.00    Types: Cigarettes  . Smokeless tobacco: Never Used  . Tobacco comment: pt refused materials  Substance Use Topics  . Alcohol use: Yes    Alcohol/week: 0.0 standard drinks    Comment: beer or wine occassionally    FAMILY HISTORY:   Family History  Problem Relation Age of Onset  . Hyperlipidemia Father   . Hypertension Father   . Heart attack Father   . Stroke Father        died @ 28  . Heart disease Mother        died @ 38  . High Cholesterol Brother   . High blood pressure Brother   . Heart disease Sister 22       MI, CABG  . High Cholesterol Sister   . High blood pressure Sister   . Colon cancer Neg Hx   . Stomach cancer Neg Hx     DRUG ALLERGIES:   Allergies  Allergen Reactions  . Lisinopril     cough    REVIEW OF SYSTEMS:   Review of Systems  Constitutional: Negative for fever and weight loss.  HENT: Negative  for congestion, nosebleeds and tinnitus.   Eyes: Negative for blurred vision, double vision and redness.  Respiratory: Positive for shortness of breath. Negative for cough and hemoptysis.   Cardiovascular: Positive for orthopnea, leg swelling and PND. Negative for chest pain.  Gastrointestinal: Negative for abdominal pain, diarrhea, melena, nausea and vomiting.  Genitourinary: Negative for dysuria, hematuria and urgency.  Musculoskeletal: Negative for falls and joint pain.  Neurological: Negative for dizziness, tingling, sensory change, focal weakness, seizures, weakness and headaches.  Endo/Heme/Allergies: Negative for polydipsia. Does not bruise/bleed easily.  Psychiatric/Behavioral: Negative for depression and memory loss. The patient is not  nervous/anxious.     MEDICATIONS AT HOME:   Prior to Admission medications   Medication Sig Start Date End Date Taking? Authorizing Provider  ALPRAZolam (XANAX) 0.25 MG tablet Take 1 tablet (0.25 mg total) by mouth 2 (two) times daily as needed for anxiety. 01/24/18   Jodelle Green, FNP  aspirin-acetaminophen-caffeine (EXCEDRIN MIGRAINE) [id number] MG per tablet Take 1 tablet by mouth every 6 (six) hours as needed.    [provider]  furosemide (LASIX) 40 MG tablet Take 1 tablet (40 mg total) by mouth daily. Patient taking differently: Take 20 mg by mouth daily.  01/25/18 04/25/18  Theora Gianotti, NP  gabapentin (NEURONTIN) 100 MG capsule Take 1 capsule (100 mg total) by mouth 3 (three) times daily. 11/06/17   Rosemarie Ax, MD  meloxicam (MOBIC) 15 MG tablet TAKE ONE TABLET BY MOUTH DAILY 12/07/17   Lucille Passy, MD  Multiple Vitamins-Minerals (CENTRUM SILVER PO) Take 1 tablet by mouth daily.    [provider]  traMADol (ULTRAM) 50 MG tablet Take 1 tablet (50 mg total) by mouth every 8 (eight) hours as needed. 09/27/17   Lucille Passy, MD  traZODone (DESYREL) 50 MG tablet TAKE 0.5-1 TABLET EVERY NIGHT AT BEDTIME AS NEEDED FOR SLEEP 12/12/17   Lucille Passy, MD      VITAL SIGNS:  Blood pressure 113/79, pulse 95, temperature 97.7 F (36.5 C), temperature source Oral, resp. rate 18, SpO2 98 %.  PHYSICAL EXAMINATION:  Physical Exam  GENERAL:  77 y.o.-year-old patient lying in the bed in NAD.   EYES: Pupils equal, round, reactive to light and accommodation. No scleral icterus. Extraocular muscles intact.  HEENT: Head atraumatic, normocephalic. Oropharynx and nasopharynx clear. No oropharyngeal erythema, moist oral mucosa  NECK:  Supple, + jugular venous distention. No thyroid enlargement, no tenderness.  LUNGS: Normal breath sounds bilaterally, no wheezing, bibasilar rales, rhonchi. No use of accessory muscles of respiration.  CARDIOVASCULAR: S1, S2 RRR.  II/VI SEM at RSB, No rubs, gallops, clicks.  ABDOMEN: Soft, nontender, nondistended. Bowel sounds present. No organomegaly or mass.  EXTREMITIES: + 2 edema from ankles to knees b/l, No cyanosis, or clubbing. + 2 pedal & radial pulses b/l.   NEUROLOGIC: Cranial nerves II through XII are intact. No focal Motor or sensory deficits appreciated b/l PSYCHIATRIC: The patient is alert and oriented x 3. Good affect.  SKIN: No obvious rash, lesion, or ulcer.   LABORATORY PANEL:   CBC No results for input(s): WBC, HGB, HCT, PLT in the last 168 hours. ------------------------------------------------------------------------------------------------------------------  Chemistries  Recent Labs  Lab 01/29/18 1520  NA 131*  K 4.2  CL 88*  CO2 33*  GLUCOSE 131*  BUN 30*  CREATININE 1.67*  CALCIUM 10.0   ------------------------------------------------------------------------------------------------------------------  Cardiac Enzymes No results for input(s): TROPONINI in the last 168 hours. ------------------------------------------------------------------------------------------------------------------  RADIOLOGY:  No results found.  IMPRESSION AND PLAN:   77 year old male with past medical history of aortic stenosis, hypertension, osteoarthritis, recently diagnosed cardiomyopathy, CKD stage III who presented to the hospital due to worsening lower extremity edema, shortness of breath and exertion.  1.  CHF-acute on chronic systolic dysfunction. -Patient was recently diagnosed with severe cardiomyopathy EF of 25 to 30%.  It is unclear if this is ischemic or nonischemic presently. - We will diurese the patient with IV Lasix, follow I's and O's and daily weights. - Vision is somewhat hypotensive therefore no room to give the patient beta-blockers a low-dose ACE inhibitors at this time.  Will consult cardiology.  2.  CKD stage III-patient's creatinine is close to baseline.  We will continue to  monitor. -Watch with diuresis.  3.  Severe cardiomyopathy-patient was recently diagnosed with a severe cardiomyopathy EF of 25 to 30%. -Unclear if this is ischemic or nonischemic.  Patient will need ischemia work-up with right and left heart catheterization once his volume status is better.  Further care as per cardiology.  4.  Aortic stenosis and mitral regurgitation- Degree of AS may be underestimated by low cardiac output (low-flow/low-gradient AS).  Once he is optimized from volume standpoint and has undergone cardiac catheterization to exclude significant CAD, dobutamine stress echo could be considered to assess myocardial reserve and potential worsening of transvalvular gradient if there is still uncertainty about the degree of AS.  I suspect his MR is functional and will hopefully improve with treatment of his heart failure.  5.  Neuropathy-continue gabapentin.  6.  Anxiety-continue Xanax as needed.    All the records are reviewed and case discussed with ED provider. Management plans discussed with the patient, family and they are in agreement.  CODE STATUS: Full code  TOTAL TIME TAKING CARE OF THIS PATIENT: 45 minutes.    Henreitta Leber M.D on 01/31/2018 at 7:00 PM  Between 7am to 6pm - Pager - [phone]  After 6pm go to [email or web address] - password [email or web address] [facility]  Office  [phone]  CC: Primary care physician; Jodelle Green, FNP

## 2018-01-31 NOTE — Telephone Encounter (Signed)
Called Pt no answer, wanting to schedule him an appt with Lauren Guse in 2-3 months

## 2018-01-31 NOTE — Progress Notes (Signed)
Follow-up Outpatient Visit Date: 01/31/2018  Primary Care Provider: Joby, Richart, FNP [address] [place] [address]  Chief Complaint: Shortness of breath and leg swelling  HPI:  George Washington is a 77 y.o. year-old male with history of hypertension, chronic kidney disease stage III, tobacco use, and arthritis, who presents for follow-up of newly diagnosed systolic heart failure.  George Washington presented a week ago for event monitor placement to assess for recently diagnosed tachycardia.  He was noted to be quite short of breath and was seen urgently by Ignacia Bayley, NP.  He reported progressive exertional dyspnea, weight gain, and lower extremity edema over the preceding 3 weeks.  Echocardiogram performed during that visit showed severely reduced LVEF with significant aortic stenosis and mitral regurgitation.  He was started on furosemide 40 mg daily, though this was decreased to 20 mg daily following BMP 2 days ago.  Today, George Washington that he feels about the same.  His swelling improved and weight declined while on furosemide 40 mg daily, though this has trailed off since being cut back to 20 mg daily.  He denies chest pain but continues to have marked shortness of breath with minimal exertion.  He also has significant orthopnea, sleeping at ~30 degrees on a good night and frequently upright.  His leg edema persists.  He has not experienced palpitations or lightheadedness, though his heart rate remains around 100 bpm.  He notes several episodes of "indigestion" after eating.  He also notes feeling quite anxious about his shortness of breath and difficulty sleeping.  --------------------------------------------------------------------------------------------------  Past Medical History:  Diagnosis Date  . Aortic stenosis   . Arthritis   . CKD (chronic kidney disease), stage III ([place])   . HTN (hypertension)   . Iron deficiency anemia   . Mitral regurgitation   . Narcotic dependence,  in remission ([place])   . Retinal vein occlusion   . Systolic heart failure ([facility] [facility]) 01/2018  . Tobacco abuse    Past Surgical History:  Procedure Laterality Date  . cyst chest  1994   left  . EYE SURGERY    . rectal fissure  1968  . vro      Recent CV Pertinent Labs: Lab Results  Component Value Date   CHOL 200 12/27/2017   HDL 64.70 12/27/2017   LDLCALC 117 (H) 12/27/2017   TRIG 90.0 12/27/2017   CHOLHDL 3 12/27/2017   K 4.2 01/29/2018   BUN 30 (H) 01/29/2018   BUN 22 (A) 11/02/2017   CREATININE 1.67 (H) 01/29/2018    Past medical and surgical history were reviewed and updated in EPIC.  Current Meds  Medication Sig  . ALPRAZolam (XANAX) 0.25 MG tablet Take 1 tablet (0.25 mg total) by mouth 2 (two) times daily as needed for anxiety.  Marland Kitchen aspirin-acetaminophen-caffeine (EXCEDRIN MIGRAINE) 250-250-65 MG per tablet Take 1 tablet by mouth every 6 (six) hours as needed.  . furosemide (LASIX) 40 MG tablet Take 1 tablet (40 mg total) by mouth daily. (Patient taking differently: Take 20 mg by mouth daily. )  . gabapentin (NEURONTIN) 100 MG capsule Take 1 capsule (100 mg total) by mouth 3 (three) times daily.  . meloxicam (MOBIC) 15 MG tablet TAKE ONE TABLET BY MOUTH DAILY  . Multiple Vitamins-Minerals (CENTRUM SILVER PO) Take 1 tablet by mouth daily.  . traMADol (ULTRAM) 50 MG tablet Take 1 tablet (50 mg total) by mouth every 8 (eight) hours as needed.  . traZODone (DESYREL) 50 MG  tablet TAKE 0.5-1 TABLET EVERY NIGHT AT BEDTIME AS NEEDED FOR SLEEP    Allergies: Lisinopril  Social History   Tobacco Use  . Smoking status: Current Every Day Smoker    Packs/day: 0.50    Types: Cigarettes  . Smokeless tobacco: Never Used  . Tobacco comment: pt refused materials  Substance Use Topics  . Alcohol use: Yes    Alcohol/week: 0.0 standard drinks    Comment: beer or wine occassionally  . Drug use: No    Family History  Problem Relation Age of Onset  . Hyperlipidemia Father   .  Hypertension Father   . Heart attack Father   . Stroke Father        died @ 52  . Heart disease Mother        died @ 12  . High Cholesterol Brother   . High blood pressure Brother   . Heart disease Sister 31       MI, CABG  . High Cholesterol Sister   . High blood pressure Sister   . Colon cancer Neg Hx   . Stomach cancer Neg Hx     Review of Systems: A 12-system review of systems was performed and was negative except as noted in the HPI.  --------------------------------------------------------------------------------------------------  Physical Exam: BP 94/60 (BP Location: Left Arm, Patient Position: Sitting, Cuff Size: Normal)   Pulse 93   Ht 5\' 9"  (1.753 m)   Wt 157 lb 8 oz (71.4 kg)   BMI 23.26 kg/m   General:  Chronically ill-appearing man, seated on the exam table.  He is accompanied by his wife. HEENT: No conjunctival pallor or scleral icterus. Moist mucous membranes.  OP clear. Neck: Supple without lymphadenopathy or thyromegaly.  JVP ~10-12 cm with positive HJR. Lungs: Normal work of breathing. Clear to auscultation bilaterally without wheezes or crackles. Heart: Regular rate and rhythm with 2/6 systolic murmur.  S3 is noted. Parasternal heave present. Abd: Bowel sounds present. Soft, NT/ND without hepatosplenomegaly Ext: 2+ pitting edema to the proximal calves bilaterally. Skin: Warm and dry without rash.  EKG:  NSR with IVCD (LBBB-like) and lateral T-wave inversions.  Lab Results  Component Value Date   WBC 5.6 01/23/2018   HGB 11.1 (L) 01/23/2018   HCT 34.1 (L) 01/23/2018   MCV 83.9 01/23/2018   PLT 253.0 01/23/2018    Lab Results  Component Value Date   NA 131 (L) 01/29/2018   K 4.2 01/29/2018   CL 88 (L) 01/29/2018   CO2 33 (H) 01/29/2018   BUN 30 (H) 01/29/2018   CREATININE 1.67 (H) 01/29/2018   GLUCOSE 131 (H) 01/29/2018   ALT 14 01/23/2018    Lab Results  Component Value Date   CHOL 200 12/27/2017   HDL 64.70 12/27/2017   LDLCALC 117  (H) 12/27/2017   TRIG 90.0 12/27/2017   CHOLHDL 3 12/27/2017    --------------------------------------------------------------------------------------------------  ASSESSMENT AND PLAN: Acute systolic heart failure George Washington continues to appear significant volume overloaded with decompensated heart failure.  He has not improved significantly with outpatient diuresis.  His BP remains borderline low, precluding addition of evidence-based heart failure therapy.  Given his NYHA class IV symptoms, I have recommended direct admission to telemetry for more aggressive diuresis and close monitoring of his renal function.  Once he becomes better compensated and if blood pressure allows, addition of low-dose beta-blocker should be considered.  I would hold off on adding an ACEI/ARB in the setting of diuresis with CKD and soft BP.  He would also benefit from right and left heart catheterization during this admission, once his volume status is optimized and he is able to lie flat for an extended period.  I recommend starting diuresis with furosemide 40 mg IV BID, to be titrated for net fluid balance of negative ~2 L/24 hours.  Aortic stenosis and mitral regurgitation Both are at least mild based on echocardiogram.  Degree of AS may be underestimated by low cardiac output (low-flow/low-gradient AS).  Once he is optimized from volume standpoint and has undergone cardiac catheterization to exclude significant CAD, dobutamine stress echo could be considered to assess myocardial reserve and potential worsening of transvalvular gradient if there is still uncertainty about the degree of AS.  I suspect his MR is functional and will hopefully improve with treatment of his heart failure.  Chronic kidney disease stage III Labs 2 days ago showed stable creatinine.  This will need to be closely monitored in the setting acute heart failure and ongoing diuresis.  I recommend avoiding nephrotoxic agents, including NSAIDs such as  meloxicam (which he is prescribed as an outpatient).  Follow-up: To be determined based on outcome of hospitalization.  Nelva Bush, MD 01/31/2018 5:42 PM

## 2018-01-31 NOTE — Progress Notes (Signed)
Patient is here in the unit text paged Dr. Verdell Carmine about patient;s arrival. Patient can eat, heart healthy diet order place. RN will continue to monitor.

## 2018-02-01 ENCOUNTER — Other Ambulatory Visit: Payer: Self-pay | Admitting: Family Medicine

## 2018-02-01 DIAGNOSIS — I5021 Acute systolic (congestive) heart failure: Secondary | ICD-10-CM

## 2018-02-01 DIAGNOSIS — I350 Nonrheumatic aortic (valve) stenosis: Secondary | ICD-10-CM

## 2018-02-01 DIAGNOSIS — N184 Chronic kidney disease, stage 4 (severe): Secondary | ICD-10-CM

## 2018-02-01 DIAGNOSIS — K4090 Unilateral inguinal hernia, without obstruction or gangrene, not specified as recurrent: Secondary | ICD-10-CM

## 2018-02-01 DIAGNOSIS — K429 Umbilical hernia without obstruction or gangrene: Secondary | ICD-10-CM

## 2018-02-01 DIAGNOSIS — M5442 Lumbago with sciatica, left side: Principal | ICD-10-CM

## 2018-02-01 DIAGNOSIS — G8929 Other chronic pain: Secondary | ICD-10-CM

## 2018-02-01 DIAGNOSIS — I420 Dilated cardiomyopathy: Secondary | ICD-10-CM

## 2018-02-01 LAB — BASIC METABOLIC PANEL
ANION GAP: 10 (ref 5–15)
BUN: 30 mg/dL — ABNORMAL HIGH (ref 8–23)
CO2: 32 mmol/L (ref 22–32)
Calcium: 9.6 mg/dL (ref 8.9–10.3)
Chloride: 90 mmol/L — ABNORMAL LOW (ref 98–111)
Creatinine, Ser: 2.06 mg/dL — ABNORMAL HIGH (ref 0.61–1.24)
GFR calc Af Amer: 34 mL/min — ABNORMAL LOW (ref >60)
GFR, EST NON AFRICAN AMERICAN: 29 mL/min — AB (ref >60)
Glucose, Bld: 92 mg/dL (ref 70–99)
POTASSIUM: 4.8 mmol/L (ref 3.5–5.1)
Sodium: 132 mmol/L — ABNORMAL LOW (ref 135–145)

## 2018-02-01 MED ORDER — HYDROCODONE-ACETAMINOPHEN 5-325 MG PO TABS
1.0000 | ORAL_TABLET | Freq: Four times a day (QID) | ORAL | Status: DC | PRN
  Administered 2018-02-01 – 2018-02-04 (×3): 1 via ORAL
  Filled 2018-02-01 (×4): qty 1

## 2018-02-01 MED ORDER — DOCUSATE SODIUM 100 MG PO CAPS
100.0000 mg | ORAL_CAPSULE | Freq: Two times a day (BID) | ORAL | Status: DC
  Administered 2018-02-01 – 2018-02-05 (×7): 100 mg via ORAL
  Filled 2018-02-01 (×8): qty 1

## 2018-02-01 MED ORDER — TRAMADOL HCL 50 MG PO TABS
50.0000 mg | ORAL_TABLET | Freq: Four times a day (QID) | ORAL | Status: DC | PRN
  Administered 2018-02-01: 50 mg via ORAL
  Filled 2018-02-01: qty 1

## 2018-02-01 MED ORDER — MORPHINE SULFATE (PF) 2 MG/ML IV SOLN
2.0000 mg | INTRAVENOUS | Status: AC
  Administered 2018-02-01: 2 mg via INTRAVENOUS
  Filled 2018-02-01: qty 1

## 2018-02-01 MED ORDER — NICOTINE 21 MG/24HR TD PT24
21.0000 mg | MEDICATED_PATCH | Freq: Every day | TRANSDERMAL | Status: DC
  Administered 2018-02-01 – 2018-02-05 (×5): 21 mg via TRANSDERMAL
  Filled 2018-02-01 (×5): qty 1

## 2018-02-01 MED ORDER — ALPRAZOLAM 0.5 MG PO TABS
0.2500 mg | ORAL_TABLET | Freq: Three times a day (TID) | ORAL | Status: DC | PRN
  Administered 2018-02-01 – 2018-02-04 (×9): 0.25 mg via ORAL
  Filled 2018-02-01 (×9): qty 1

## 2018-02-01 MED ORDER — POLYETHYLENE GLYCOL 3350 17 G PO PACK
17.0000 g | PACK | Freq: Every day | ORAL | Status: DC | PRN
  Administered 2018-02-01 – 2018-02-04 (×4): 17 g via ORAL
  Filled 2018-02-01 (×4): qty 1

## 2018-02-01 MED ORDER — TRAMADOL HCL 50 MG PO TABS: 50.0000 mg | ORAL_TABLET | Freq: Four times a day (QID) | ORAL | Status: DC

## 2018-02-01 NOTE — Consult Note (Signed)
Cardiology Consultation:   Patient ID: George Washington. MRN: [id number]; DOB: 05/31/1940  Admit date: 01/31/2018 Date of Consult: 02/01/2018  Primary Care Provider: Jodelle Green, FNP Primary Cardiologist: Dr. Saunders Revel Primary Electrophysiologist:  None    Patient Profile:   George Washington. is a 77 y.o. male with a hx of HFrEF (20-25%), hypertension, CKDIII, tobacco use, and arthritis with newly diagnosed heart failure who is being seen today for the evaluation of acute HFrEF at the request of Dr. Saunders Revel.  History of Present Illness:   Mr. Bernard is a 77 year old male with a history of hypertension, chronic kidney disease stage III, tobacco use, and arthritis, with newly diagnosed systolic heart failure.    01/25/2018: Mr. Moser presented a week ago for event monitor placement to assess for recently diagnosed tachycardia.  He was noted to be quite short of breath and was seen urgently by Ignacia Bayley, NP.  He reported progressive exertional dyspnea, weight gain, and lower extremity edema over the preceding 3 weeks.  11/14 Echocardiogram performed during that visit showed severely reduced LVEF with significant aortic stenosis and mitral regurgitation.  He was started on furosemide 40 mg daily, but this was decreased to 20 mg daily following BMP.  01/31/2018: During his outpatient appointment with Dr. Saunders Revel, Mr. Golinski reported that he felt about the same.  His swelling improved and weight declined on furosemide 40 mg daily, but this trailed off since cutting back to 20 mg daily. He denied chest pain, but continued to have marked shortness of breath with minimal exertion.  He also had significant orthopnea, sleeping at about 30 degrees on a good night and frequently upright.  His leg edema persisted.  He had not experienced palpitations or lightheadedness, but his heart rate remained around 100 bpm.  He noted several episodes of indigestion after eating. He also noted feeling quite anxious about his shortness of  breath and difficulty sleeping.  The patient continued to appear significantly volume overloaded with decompensated heart failure.  He had not improved significantly with outpatient diuresis.  His blood pressure remained borderline low, precluding addition of evidence-based heart failure therapy.    Given his NYHA class IV symptoms, it was recommended that he directly be admitted to [facility] telemetry for more aggressive diuresis and close monitoring of his renal function.    Past Medical History:  Diagnosis Date  . Aortic stenosis   . Arthritis   . CKD (chronic kidney disease), stage III ([place])   . HTN (hypertension)   . Iron deficiency anemia   . Mitral regurgitation   . Narcotic dependence, in remission ([place])   . Retinal vein occlusion   . Systolic heart failure ([place]) 01/2018  . Tobacco abuse     Past Surgical History:  Procedure Laterality Date  . cyst chest  1994   left  . EYE SURGERY    . rectal fissure  1968  . vro       Home Medications:  Prior to Admission medications   Medication Sig Start Date End Date Taking? Authorizing Provider  ALPRAZolam (XANAX) 0.25 MG tablet Take 1 tablet (0.25 mg total) by mouth 2 (two) times daily as needed for anxiety. 01/24/18   Jodelle Green, FNP  aspirin-acetaminophen-caffeine (EXCEDRIN MIGRAINE) [phone] MG per tablet Take 1 tablet by mouth every 6 (six) hours as needed.    [provider]  furosemide (LASIX) 40 MG tablet Take 1 tablet (40 mg total) by mouth daily. Patient taking differently: Take  20 mg by mouth daily.  01/25/18 04/25/18  Theora Gianotti, NP  gabapentin (NEURONTIN) 100 MG capsule Take 1 capsule (100 mg total) by mouth 3 (three) times daily. 11/06/17   Rosemarie Ax, MD  meloxicam (MOBIC) 15 MG tablet TAKE ONE TABLET BY MOUTH DAILY 12/07/17   Lucille Passy, MD  Multiple Vitamins-Minerals (CENTRUM SILVER PO) Take 1 tablet by mouth daily.    [provider]  traMADol (ULTRAM) 50 MG tablet Take 1  tablet (50 mg total) by mouth every 8 (eight) hours as needed. 09/27/17   Lucille Passy, MD  traZODone (DESYREL) 50 MG tablet TAKE 0.5-1 TABLET EVERY NIGHT AT BEDTIME AS NEEDED FOR SLEEP 12/12/17   Lucille Passy, MD    Inpatient Medications: Scheduled Meds: . enoxaparin (LOVENOX) injection  40 mg Subcutaneous Q24H  . furosemide  40 mg Intravenous BID  . gabapentin  100 mg Oral TID  . multivitamin with minerals  1 tablet Oral Daily  . potassium chloride  20 mEq Oral Daily  . sodium chloride flush  3 mL Intravenous Q12H   Continuous Infusions: . sodium chloride     PRN Meds: sodium chloride, acetaminophen, ALPRAZolam, HYDROcodone-acetaminophen, ondansetron (ZOFRAN) IV, sodium chloride flush, traMADol, traZODone  Allergies:    Allergies  Allergen Reactions  . Lisinopril     cough    Social History:   Social History   Socioeconomic History  . Marital status: Married    Spouse name: Not on file  . Number of children: 2  . Years of education: Not on file  . Highest education level: Not on file  Occupational History  . Occupation: Scientist, research (physical sciences): [employer]    Comment: RETIRED  Social Needs  . Financial resource strain: Not on file  . Food insecurity:    Worry: Not on file    Inability: Not on file  . Transportation needs:    Medical: Not on file    Non-medical: Not on file  Tobacco Use  . Smoking status: Current Every Day Smoker    Packs/day: 0.50    Years: 30.00    Pack years: 15.00    Types: Cigarettes  . Smokeless tobacco: Never Used  . Tobacco comment: pt refused materials  Substance and Sexual Activity  . Alcohol use: Yes    Alcohol/week: 0.0 standard drinks    Comment: beer or wine occassionally  . Drug use: No  . Sexual activity: Yes  Lifestyle  . Physical activity:    Days per week: Not on file    Minutes per session: Not on file  . Stress: Not on file  Relationships  . Social connections:    Talks on phone: Not on file    Gets  together: Not on file    Attends religious service: Not on file    Active member of club or organization: Not on file    Attends meetings of clubs or organizations: Not on file    Relationship status: Not on file  . Intimate partner violence:    Fear of current or ex partner: Not on file    Emotionally abused: Not on file    Physically abused: Not on file    Forced sexual activity: Not on file  Other Topics Concern  . Not on file  Social History Narrative   Lives locally with wife Baker Janus Arzuaga.   Does not routinely exercise.   Retired Software engineer (retail).   He does attend  NA meetings.      Does have a living will- does not want prolonged life support if futile.    Family History:    Family History  Problem Relation Age of Onset  . Hyperlipidemia Father   . Hypertension Father   . Heart attack Father   . Stroke Father        died @ 102  . Heart disease Mother        died @ 15  . High Cholesterol Brother   . High blood pressure Brother   . Heart disease Sister 65       MI, CABG  . High Cholesterol Sister   . High blood pressure Sister   . Colon cancer Neg Hx   . Stomach cancer Neg Hx      ROS:  Please see the history of present illness.   All other ROS reviewed and negative.     Physical Exam/Data:   Vitals:   01/31/18 1741 01/31/18 1919 02/01/18 0419 02/01/18 0732  BP: 113/79 95/75 100/70 102/82  Pulse: 95 92 89 90  Resp: 18 18 18 19   Temp: 97.7 F (36.5 C) 98.2 F (36.8 C) 98.3 F (36.8 C) 98.4 F (36.9 C)  TempSrc: Oral Oral Oral Oral  SpO2: 98% 94% 92% 94%  Weight: 70.5 kg  71 kg   Height: 5\' 9"  (1.753 m)       Intake/Output Summary (Last 24 hours) at 02/01/2018 0810 Last data filed at 01/31/2018 2240 Gross per 24 hour  Intake 480 ml  Output 300 ml  Net 180 ml   Filed Weights   01/31/18 1741 02/01/18 0419  Weight: 70.5 kg 71 kg   Body mass index is 23.11 kg/m.  General: Chronically ill-appearing man.  HEENT: normal Neck: no JVP ~10-12 cm, +  HJR Vascular: No carotid bruits; FA pulses 2+ bilaterally without bruits  Cardiac:  RRR S1, S2, + S3; 2/6 systolic murmur, parasternal heave. Lungs:  clear to auscultation bilaterally, no wheezing, rhonchi or rales  Abd: soft, nontender, no hepatomegaly  Ext: 2+ bilateral lower extremity edema to proximal calves Musculoskeletal:  No deformities, BUE and BLE strength normal and equal Skin: warm and dry  Neuro:  CNs 2-12 intact, no focal abnormalities noted Psych:  Normal affect   EKG:  The EKG was personally reviewed and demonstrates:  11/20 EKG NSR with IVCD(left bundle branch block like) and lateral T wave inversions Telemetry:  Telemetry was personally reviewed and demonstrates:  NSR, 90s  Relevant CV Studies:  11/14 TTE - Left ventricle: The cavity size was moderately dilated. Systolic   function was severely reduced. The estimated ejection fraction   was in the range of 20% to 25%. Diffuse hypokinesis. Hypokinesis   of the inferior myocardium. Hypokinesis of the inferolateral   myocardium. Hypokinesis of the lateral myocardium. Features are   consistent with a pseudonormal left ventricular filling pattern,   with concomitant abnormal relaxation and increased filling   pressure (grade 2 diastolic dysfunction). - Aortic valve: There was moderate stenosis by mean gradient and   peak velocity. Possibly severe by estimated AVA, may be   under-estimated secondary to severely reduced LV function. There   was mild to moderate regurgitation. Peak velocity (S): 342 cm/s.   Mean gradient (S): 27 mm Hg. Valve area (VTI): 0.52 cm^2. - Mitral valve: There was moderate regurgitation. Valve area by   continuity equation (using LVOT flow): 1.47 cm^2. - Left atrium: The atrium was moderately to severely  dilated. - Pulmonary arteries: Systolic pressure was moderately elevated. PA   peak pressure: 57 mm Hg (S). - Inferior vena cava: The vessel was dilated. The respirophasic   diameter changes  were blunted (< 50%), consistent with elevated   central venous pressure.  Impressions:  - Left pleural effusion noted, 5.7 cm.   Laboratory Data:  Chemistry Recent Labs  Lab 01/29/18 1520 01/31/18 1908 02/01/18 0438  NA 131*  --  132*  K 4.2  --  4.8  CL 88*  --  90*  CO2 33*  --  32  GLUCOSE 131*  --  92  BUN 30*  --  30*  CREATININE 1.67* 1.77* 2.06*  CALCIUM 10.0  --  9.6  GFRNONAA 38* 35* 29*  GFRAA 44* 41* 34*  ANIONGAP 10  --  10    No results for input(s): PROT, ALBUMIN, AST, ALT, ALKPHOS, BILITOT in the last 168 hours. Hematology Recent Labs  Lab 01/31/18 1908  WBC 5.1  RBC 3.94*  HGB 10.4*  HCT 33.3*  MCV 84.5  MCH 26.4  MCHC 31.2  RDW 16.5*  PLT 202   Cardiac EnzymesNo results for input(s): TROPONINI in the last 168 hours. No results for input(s): TROPIPOC in the last 168 hours.  BNPNo results for input(s): BNP, PROBNP in the last 168 hours.  DDimer No results for input(s): DDIMER in the last 168 hours.  Radiology/Studies:  No results found.  Assessment and Plan:   1. Acute Systolic HF - Direct admission per [facility] office as above in HPI - Continue diuresis with furosemide 40 mg IV twice daily to be titrated for net fluid balance of negative ~2 L /24 hours. - Once better compensated and BP allows, addition of low-dose beta-blocker could be considered.  - Given his low BP, do not recommend ACE inhibitor/ARB given diuresis with chronic kidney disease and hypotension.  Continue to hold. - Once optimization of volume status achieved, he may benefit from [facility] if able to lie flat for an extended period of time.    2. Aortic Stenosis and Mitral Regurgitation  -On echocardiogram, both AS/MR are at least mild.  Degree of AS may be underestimated by low CO (low flow/low gradient AS)   - Suspicion at this time is that MR is functional and will hopefully improve with treatment of his heart failure. - Once he is optimized from a volume  standpoint, and he has undergone cardiac catheterization to exclude significant CAD, dobutamine stress echo could be considered to assess myocardial reserve and potential worsening of transvalvular gradient if there is still uncertainty about the degree AS.    3. CKDIII -Previous labs showed stable creatinine.   - Daily BMET - Continue to monitor renal function and electrolytes in the setting of acute heart failure and ongoing diuresis.  Avoid nephrotoxic agents, including NSAIDs such as meloxicam (which she is prescribed as an outpatient).  4. Remainder per IM      For questions or updates, please contact [place] Please consult [email or web address] for contact info under     Signed, Arvil Chaco, PA-C  02/01/2018 8:10 AM

## 2018-02-01 NOTE — Consult Note (Signed)
Date of Consultation:  02/01/2018  Requesting Physician:  Abel Presto, MD  Reason for Consultation:  Right inguinal hernia  History of Present Illness: George Washington. is a 77 y.o. male currently admitted last night with CHF exacerbation.  He has a known recent right inguinal hernia, that he reports he's had for a few weeks.  He reports that he was fine for his yearly physical exam and then started with the hernia.  He reports that the hernia is reducible and has been otherwise asymptomatic until this morning when the bulging was harder and painful.  Dr. Verdell Carmine called me for consult given his new symptom.  He denies any nausea, vomiting, obstructive symptoms.  His breathing feels better after getting extra IV dose of Lasix, and is not on any oxygen.  He has not had any abdominal surgery in the past.    Past Medical History: Past Medical History:  Diagnosis Date  . Aortic stenosis   . Arthritis   . CKD (chronic kidney disease), stage III ([employer])   . HTN (hypertension)   . Iron deficiency anemia   . Mitral regurgitation   . Narcotic dependence, in remission ([place])   . Retinal vein occlusion   . Systolic heart failure ([place]) 01/2018  . Tobacco abuse      Past Surgical History: Past Surgical History:  Procedure Laterality Date  . cyst chest  1994   left  . EYE SURGERY    . rectal fissure  1968  . vro      Home Medications: Prior to Admission medications   Medication Sig Start Date End Date Taking? Authorizing Provider  ALPRAZolam (XANAX) 0.25 MG tablet Take 1 tablet (0.25 mg total) by mouth 2 (two) times daily as needed for anxiety. 01/24/18   Jodelle Green, FNP  aspirin-acetaminophen-caffeine (EXCEDRIN MIGRAINE) [phone] MG per tablet Take 1 tablet by mouth every 6 (six) hours as needed.    [provider]  furosemide (LASIX) 40 MG tablet Take 1 tablet (40 mg total) by mouth daily. Patient taking differently: Take 20 mg by mouth daily.  01/25/18 04/25/18  Theora Gianotti, NP  gabapentin (NEURONTIN) 100 MG capsule Take 1 capsule (100 mg total) by mouth 3 (three) times daily. 11/06/17   Rosemarie Ax, MD  meloxicam (MOBIC) 15 MG tablet TAKE ONE TABLET BY MOUTH DAILY 12/07/17   Lucille Passy, MD  Multiple Vitamins-Minerals (CENTRUM SILVER PO) Take 1 tablet by mouth daily.    [provider]  traMADol (ULTRAM) 50 MG tablet Take 1 tablet (50 mg total) by mouth every 8 (eight) hours as needed. 09/27/17   Lucille Passy, MD  traZODone (DESYREL) 50 MG tablet TAKE 0.5-1 TABLET EVERY NIGHT AT BEDTIME AS NEEDED FOR SLEEP 12/12/17   Lucille Passy, MD    Allergies: Allergies  Allergen Reactions  . Lisinopril     cough    Social History:  reports that he has been smoking cigarettes. He has a 15.00 pack-year smoking history. He has never used smokeless tobacco. He reports that he drinks alcohol. He reports that he does not use drugs.   Family History: Family History  Problem Relation Age of Onset  . Hyperlipidemia Father   . Hypertension Father   . Heart attack Father   . Stroke Father        died @ 54  . Heart disease Mother        died @ 44  . High Cholesterol Brother   .  High blood pressure Brother   . Heart disease Sister 54       MI, CABG  . High Cholesterol Sister   . High blood pressure Sister   . Colon cancer Neg Hx   . Stomach cancer Neg Hx     Review of Systems: Review of Systems  Constitutional: Negative for chills and fever.  Eyes: Negative for blurred vision.  Respiratory: Negative for shortness of breath.   Cardiovascular: Negative for chest pain.  Gastrointestinal: Positive for abdominal pain (right groin pain). Negative for nausea and vomiting.  Genitourinary: Negative for dysuria.  Musculoskeletal: Negative for myalgias.  Skin: Negative for rash.  Neurological: Negative for dizziness.  Psychiatric/Behavioral: Negative for depression.    Physical Exam BP 102/82 (BP Location: Left Arm)   Pulse 90    Temp 98.4 F (36.9 C) (Oral)   Resp 19   Ht 5\' 9"  (1.753 m)   Wt 71 kg   SpO2 94%   BMI 23.11 kg/m  CONSTITUTIONAL: No acute distress HEENT:  Normocephalic, atraumatic, extraocular motion intact. NECK: Trachea is midline, and there is no jugular venous distension. RESPIRATORY:  Lungs are clear, and breath sounds are equal bilaterally. Normal respiratory effort without pathologic use of accessory muscles. CARDIOVASCULAR: Heart is regular without murmurs, gallops, or rubs. GI: The abdomen is soft, non-distended, with some discomfort in the right groin.  He just received IV morphine for pain.  He has a right groin hernia with a hard bulge.  No skin discoloration or ulceration.  I was able to manually reduce the hernia without complications.  He also has a reducible small umbilical hernia.  MUSCULOSKELETAL:  Normal muscle strength and tone in all four extremities.  No peripheral edema or cyanosis. SKIN: Skin turgor is normal. There are no pathologic skin lesions.  NEUROLOGIC:  Motor and sensation is grossly normal.  Cranial nerves are grossly intact. PSYCH:  Alert and oriented to person, place and time. Affect is normal.  Laboratory Analysis: Results for orders placed or performed during the hospital encounter of 01/31/18 (from the past 24 hour(s))  CBC     Status: Abnormal   Collection Time: 01/31/18  7:08 PM  Result Value Ref Range   WBC 5.1 4.0 - 10.5 K/uL   RBC 3.94 (L) 4.22 - 5.81 MIL/uL   Hemoglobin 10.4 (L) 13.0 - 17.0 g/dL   HCT 33.3 (L) 39.0 - 52.0 %   MCV 84.5 80.0 - 100.0 fL   MCH 26.4 26.0 - 34.0 pg   MCHC 31.2 30.0 - 36.0 g/dL   RDW 16.5 (H) 11.5 - 15.5 %   Platelets 202 150 - 400 K/uL   nRBC 0.0 0.0 - 0.2 %  Creatinine, serum     Status: Abnormal   Collection Time: 01/31/18  7:08 PM  Result Value Ref Range   Creatinine, Ser 1.77 (H) 0.61 - 1.24 mg/dL   GFR calc non Af Amer 35 (L) >60 mL/min   GFR calc Af Amer 41 (L) >60 mL/min  Basic metabolic panel     Status:  Abnormal   Collection Time: 02/01/18  4:38 AM  Result Value Ref Range   Sodium 132 (L) 135 - 145 mmol/L   Potassium 4.8 3.5 - 5.1 mmol/L   Chloride 90 (L) 98 - 111 mmol/L   CO2 32 22 - 32 mmol/L   Glucose, Bld 92 70 - 99 mg/dL   BUN 30 (H) 8 - 23 mg/dL   Creatinine, Ser 2.06 (H) 0.61 - 1.24  mg/dL   Calcium 9.6 8.9 - 10.3 mg/dL   GFR calc non Af Amer 29 (L) >60 mL/min   GFR calc Af Amer 34 (L) >60 mL/min   Anion gap 10 5 - 15    Imaging: No results found.  Assessment and Plan: This is a 77 y.o. male with a temporarily incarcerated right inguinal hernia, which was able to be reduced at bedside.  Discussed with the patient that at this point, with the hernia reduced, there is no emergency surgery needed.  Given his CHF exacerbation, would like him to be optimized and treated first, and he can follow up with me in the office as an outpatient to discuss surgery for the right groin hernia and umbilical hernia.  He would need cardiac clearance, which can be arranged as outpatient as well.  Briefly discussed with him the options of laparoscopic vs open inguinal hernia repair.  He is interested in discussing further and will follow up with [place] after discharge.  He understands that if during his hospital stay, he were to have worse pain again, to let us know right away so we can try to reduce his hernia.  If this happens at home, he should call or come to ED for further evaluation.  Recommended that he avoid any strenuous activity, particularly avoid any heavy lifting or pushing of more than 10-15 lbs to try to prevent hernia bulging.  Face-to-face time spent with the patient and care providers was 55 minutes, with more than 50% of the time spent counseling, educating, and coordinating care of the patient.     Melvyn Neth, MD [facility]g:  [phone]

## 2018-02-01 NOTE — Progress Notes (Signed)
[facility] [place] NAME: George Washington    MR#:  [id number]  DATE OF BIRTH:  February 16, 1941  SUBJECTIVE:   Pt. Complaining of some right groin pain this morning and noted to have an inguinal hernia.  No worsening shortness of breath, Chest pain.   REVIEW OF SYSTEMS:    Review of Systems  Constitutional: Negative for chills and fever.  HENT: Negative for congestion and tinnitus.   Eyes: Negative for blurred vision and double vision.  Respiratory: Positive for shortness of breath. Negative for cough and wheezing.   Cardiovascular: Positive for orthopnea, leg swelling and PND. Negative for chest pain.  Gastrointestinal: Negative for abdominal pain, diarrhea, nausea and vomiting.  Genitourinary: Negative for dysuria and hematuria.  Neurological: Negative for dizziness, sensory change and focal weakness.  All other systems reviewed and are negative.   Nutrition: Heart healhty Tolerating Diet: Yes Tolerating PT: Ambulatory  DRUG ALLERGIES:   Allergies  Allergen Reactions  . Lisinopril     cough    VITALS:  Blood pressure 102/82, pulse 90, temperature 98.4 F (36.9 C), temperature source Oral, resp. rate 19, height 5\' 9"  (1.753 m), weight 71 kg, SpO2 94 %.  PHYSICAL EXAMINATION:   Physical Exam  GENERAL:  77 y.o.-year-old patient sitting up in chair complaining of right groin pain.  EYES: Pupils equal, round, reactive to light and accommodation. No scleral icterus. Extraocular muscles intact.  HEENT: Head atraumatic, normocephalic. Oropharynx and nasopharynx clear.  NECK:  Supple, no jugular venous distention. No thyroid enlargement, no tenderness.  LUNGS: Normal breath sounds bilaterally, no wheezing, bibasilar rales, rhonchi. No use of accessory muscles of respiration.  CARDIOVASCULAR: S1, S2 normal. No murmurs, rubs, or gallops.  ABDOMEN: Soft, nontender, nondistended. Bowel sounds present. No organomegaly or mass.  EXTREMITIES:  No cyanosis, clubbing, + 1-2 edema b/l.    NEUROLOGIC: Cranial nerves II through XII are intact. No focal Motor or sensory deficits b/l.   PSYCHIATRIC: The patient is alert and oriented x 3.  SKIN: No obvious rash, lesion, or ulcer.   GU:  Right groin inguinal hernia.  LABORATORY PANEL:   CBC Recent Labs  Lab 01/31/18 1908  WBC 5.1  HGB 10.4*  HCT 33.3*  PLT 202   ------------------------------------------------------------------------------------------------------------------  Chemistries  Recent Labs  Lab 02/01/18 0438  NA 132*  K 4.8  CL 90*  CO2 32  GLUCOSE 92  BUN 30*  CREATININE 2.06*  CALCIUM 9.6   ------------------------------------------------------------------------------------------------------------------  Cardiac Enzymes No results for input(s): TROPONINI in the last 168 hours. ------------------------------------------------------------------------------------------------------------------  RADIOLOGY:  No results found.   ASSESSMENT AND PLAN:   77 year old male with past medical history of aortic stenosis, hypertension, osteoarthritis, recently diagnosed cardiomyopathy, CKD stage III who presented to the hospital due to worsening lower extremity edema, shortness of breath and exertion.  1.  CHF-acute on chronic systolic dysfunction. -Patient was recently diagnosed with severe cardiomyopathy EF of 25 to 30%.  It is unclear if this is ischemic or nonischemic presently. - cont. Diuresis with IV lasix and follow I's and O's and daily weights.  - BP on low side and hold B-blockers, ACE for now. [facility] input.   2.  Right inguinal hernia-patient had a partially incarcerated right inguinal hernia this morning was complaining of significant pain.  Patient given some IV morphine.  Surgical consult obtained and they were able to reduce the hernia. - Plan to follow-up with them as outpatient once patient  is medically optimized in the cardiac  standpoint for surgery for the hernia.  3.  CKD stage III- Cr. Slightly higher from yesterday and will cont. To monitor with diuresis.   4.  Severe cardiomyopathy-patient was recently diagnosed with a severe cardiomyopathy EF of 25 to 30%. -Unclear if this is ischemic or nonischemic.  Patient will need ischemia work-up with right and left heart catheterization once his volume status is better.  Further care as per cardiology.  5.  Aortic stenosis and mitral regurgitation- Degree of AS may be underestimated by low cardiac output (low-flow/low-gradient AS). Once he is optimized from volume standpoint and has undergone cardiac catheterization to exclude significant CAD, dobutamine stress echo could be considered to assess myocardial reserve and potential worsening of transvalvular gradient if there is still uncertainty about the degree of AS. I suspect his MR is functional and will hopefully improve with treatment of his heart failure.  6.  Neuropathy-continue gabapentin.  7.  Anxiety-continue Xanax as needed.   All the records are reviewed and case discussed with Care Management/Social Worker. Management plans discussed with the patient, family and they are in agreement.  CODE STATUS: Full code  DVT Prophylaxis: Lovenox  TOTAL TIME TAKING CARE OF THIS PATIENT: 35 minutes.   POSSIBLE D/C IN 2-3 DAYS, DEPENDING ON CLINICAL CONDITION.   Henreitta Leber M.D on 02/01/2018 at 2:21 PM  Between 7am to 6pm - Pager - [phone]  After 6pm go to [email or web address] - Proofreader  [facility]  Office  [phone]  CC: Primary care physician; Jodelle Green, FNP

## 2018-02-01 NOTE — Care Management Note (Addendum)
Case Management Note  Patient Details  Name: George Washington. MRN: [id number] Date of Birth: 1941/02/15  Subjective/Objective:    Patient is from home; lives with wife who is a Marine scientist at [employer].    Admitted with sob and continued edema.  He was recently diagnosed in October with CHF.  He has a functioning scale and weighs daily.  He and his wife watch their sodium intake.  He is independent in all adl's.  Denies difficulty obtaining medications and with medical care.  Current with PCP.  Would not qualify for home health at this time due to not being home bound.  Will continue to follow as patient progresses.                 Action/Plan:   Expected Discharge Date:                  Expected Discharge Plan:  Home/Self Care  In-House Referral:     Discharge planning Services  CM Consult  Post Acute Care Choice:    Choice offered to:     DME Arranged:    DME Agency:     HH Arranged:    HH Agency:     Status of Service:  In process, will continue to follow  If discussed at Long Length of Stay Meetings, dates discussed:    Additional Comments:  Elza Rafter, RN 02/01/2018, 10:38 AM

## 2018-02-02 DIAGNOSIS — F419 Anxiety disorder, unspecified: Secondary | ICD-10-CM

## 2018-02-02 LAB — BASIC METABOLIC PANEL
Anion gap: 11 (ref 5–15)
Anion gap: 8 (ref 5–15)
BUN: 36 mg/dL — AB (ref 8–23)
BUN: 36 mg/dL — AB (ref 8–23)
CHLORIDE: 86 mmol/L — AB (ref 98–111)
CHLORIDE: 85 mmol/L — AB (ref 98–111)
CO2: 29 mmol/L (ref 22–32)
CO2: 32 mmol/L (ref 22–32)
CREATININE: 1.97 mg/dL — AB (ref 0.61–1.24)
Calcium: 9.5 mg/dL (ref 8.9–10.3)
Calcium: 9.7 mg/dL (ref 8.9–10.3)
Creatinine, Ser: 1.93 mg/dL — ABNORMAL HIGH (ref 0.61–1.24)
GFR calc Af Amer: 36 mL/min — ABNORMAL LOW (ref >60)
GFR calc Af Amer: 37 mL/min — ABNORMAL LOW (ref >60)
GFR calc non Af Amer: 31 mL/min — ABNORMAL LOW (ref >60)
GFR, EST NON AFRICAN AMERICAN: 32 mL/min — AB (ref >60)
GLUCOSE: 147 mg/dL — AB (ref 70–99)
GLUCOSE: 121 mg/dL — AB (ref 70–99)
POTASSIUM: 4.9 mmol/L (ref 3.5–5.1)
POTASSIUM: 4.7 mmol/L (ref 3.5–5.1)
SODIUM: 125 mmol/L — AB (ref 135–145)
Sodium: 126 mmol/L — ABNORMAL LOW (ref 135–145)

## 2018-02-02 MED ORDER — BISACODYL 10 MG RE SUPP
10.0000 mg | Freq: Every day | RECTAL | Status: DC | PRN
  Administered 2018-02-02: 10 mg via RECTAL
  Filled 2018-02-02: qty 1

## 2018-02-02 MED ORDER — BISACODYL 5 MG PO TBEC
5.0000 mg | DELAYED_RELEASE_TABLET | Freq: Every day | ORAL | Status: DC | PRN
  Administered 2018-02-02: 5 mg via ORAL
  Filled 2018-02-02: qty 1

## 2018-02-02 MED ORDER — MAGNESIUM HYDROXIDE 400 MG/5ML PO SUSP
15.0000 mL | Freq: Once | ORAL | Status: AC
  Administered 2018-02-02: 15 mL via ORAL
  Filled 2018-02-02: qty 30

## 2018-02-02 MED ORDER — TOLVAPTAN 15 MG PO TABS
15.0000 mg | ORAL_TABLET | Freq: Once | ORAL | Status: AC
  Administered 2018-02-02: 15 mg via ORAL
  Filled 2018-02-02: qty 1

## 2018-02-02 NOTE — Care Management Important Message (Signed)
Copy of signed IM left with patient in room.  

## 2018-02-02 NOTE — Progress Notes (Signed)
[facility] [place] NAME: George Washington    MR#:  [id number]  DATE OF BIRTH:  11/17/1940  SUBJECTIVE:   No Acute events overnight, patient still complaining of some shortness of breath on minimal exertion, was not able to sleep well last night still has some lower extremity edema.  REVIEW OF SYSTEMS:    Review of Systems  Constitutional: Negative for chills and fever.  HENT: Negative for congestion and tinnitus.   Eyes: Negative for blurred vision and double vision.  Respiratory: Positive for shortness of breath. Negative for cough and wheezing.   Cardiovascular: Positive for orthopnea, leg swelling and PND. Negative for chest pain.  Gastrointestinal: Negative for abdominal pain, diarrhea, nausea and vomiting.  Genitourinary: Negative for dysuria and hematuria.  Neurological: Negative for dizziness, sensory change and focal weakness.  All other systems reviewed and are negative.   Nutrition: Heart healhty Tolerating Diet: Yes Tolerating PT: Ambulatory  DRUG ALLERGIES:   Allergies  Allergen Reactions  . Lisinopril     cough    VITALS:  Blood pressure 109/79, pulse (!) 101, temperature 98.3 F (36.8 C), temperature source Oral, resp. rate 20, height 5\' 9"  (1.753 m), weight 72.4 kg, SpO2 93 %.  PHYSICAL EXAMINATION:   Physical Exam  GENERAL:  77 y.o.-year-old patient sitting up in chair complaining of some shortness of breath. EYES: Pupils equal, round, reactive to light and accommodation. No scleral icterus. Extraocular muscles intact.  HEENT: Head atraumatic, normocephalic. Oropharynx and nasopharynx clear.  NECK:  Supple, no jugular venous distention. No thyroid enlargement, no tenderness.  LUNGS: Normal breath sounds bilaterally, no wheezing, bibasilar rales, rhonchi. No use of accessory muscles of respiration.  CARDIOVASCULAR: S1, S2 normal. No murmurs, rubs, or gallops.  ABDOMEN: Soft, nontender, nondistended. Bowel  sounds present. No organomegaly or mass.  EXTREMITIES: No cyanosis, clubbing, + 2 edema b/l R>L.    NEUROLOGIC: Cranial nerves II through XII are intact. No focal Motor or sensory deficits b/l.   PSYCHIATRIC: The patient is alert and oriented x 3.  SKIN: No obvious rash, lesion, or ulcer.   GU:  Right groin inguinal hernia.  LABORATORY PANEL:   CBC Recent Labs  Lab 01/31/18 1908  WBC 5.1  HGB 10.4*  HCT 33.3*  PLT 202   ------------------------------------------------------------------------------------------------------------------  Chemistries  Recent Labs  Lab 02/02/18 0517  NA 126*  K 4.9  CL 86*  CO2 29  GLUCOSE 147*  BUN 36*  CREATININE 1.97*  CALCIUM 9.5   ------------------------------------------------------------------------------------------------------------------  Cardiac Enzymes No results for input(s): TROPONINI in the last 168 hours. ------------------------------------------------------------------------------------------------------------------  RADIOLOGY:  No results found.   ASSESSMENT AND PLAN:   77 year old male with past medical history of aortic stenosis, hypertension, osteoarthritis, recently diagnosed cardiomyopathy, CKD stage III who presented to the hospital due to worsening lower extremity edema, shortness of breath and exertion.  1.  CHF-acute on chronic systolic dysfunction. -Patient was recently diagnosed with severe cardiomyopathy EF of 25 to 30%.  It is unclear if this is ischemic or nonischemic presently. -Patient diuresing well with IV Lasix.  Creatinine is stable. - BP on low side and hold B-blockers, ACE for now. [facility] input.   2.  Right inguinal hernia-patient had a partially incarcerated right inguinal hernia and was complaining of significant pain.  Patient given some IV morphine.  Surgical consult obtained and they were able to reduce the hernia. - Plan to follow-up with them as outpatient once patient  is  medically optimized in the cardiac standpoint for surgery for the hernia.  3.  CKD stage III-Cr. At baseline and will cont. To monitor with diuresis.   4.  Hyponatremia-patient's sodium is down to 126 today.  Discussed with cardiology and will repeat sodium this afternoon and if it remains low would consider discontinuing Lasix and starting the patient on some tolvaptan.  5.  Severe cardiomyopathy-patient was recently diagnosed with a severe cardiomyopathy EF of 25 to 30%. -Unclear if this is ischemic or nonischemic.  Patient will need ischemia work-up with right and left heart catheterization once his volume status is better.  Further care as per cardiology.  6.  Aortic stenosis and mitral regurgitation- Degree of AS may be underestimated by low cardiac output (low-flow/low-gradient AS). Once he is optimized from volume standpoint and has undergone cardiac catheterization to exclude significant CAD, dobutamine stress echo could be considered to assess myocardial reserve and potential worsening of transvalvular gradient if there is still uncertainty about the degree of AS. I suspect his MR is functional and will hopefully improve with treatment of his heart failure.  7.  Neuropathy-continue gabapentin.  8.  Anxiety-continue Xanax as needed.   All the records are reviewed and case discussed with Care Management/Social Worker. Management plans discussed with the patient, family and they are in agreement.  CODE STATUS: Full code  DVT Prophylaxis: Lovenox  TOTAL TIME TAKING CARE OF THIS PATIENT: 30 minutes.   POSSIBLE D/C IN 2-3 DAYS, DEPENDING ON CLINICAL CONDITION.   Henreitta Leber M.D on 02/02/2018 at 2:49 PM  Between 7am to 6pm - Pager - [phone]  After 6pm go to [email or web address] - Proofreader  [facility]  Office  [phone]  CC: Primary care physician; Jodelle Green, FNP

## 2018-02-02 NOTE — Telephone Encounter (Signed)
Called Pt to schedule appt ( with Lauren Guse 2-3 months from now), No answer left a VM.

## 2018-02-02 NOTE — Progress Notes (Signed)
Notified MD of pt not being able to have a bowel movement. Pt stated that when he takes morphine, it really constipates him. Orders placed. Will continue to monitor and assess.

## 2018-02-02 NOTE — Progress Notes (Addendum)
Progress Note  Patient Name: George Washington. Date of Encounter: 02/02/2018  Primary Cardiologist: End  Subjective   SOB and lower extremity improving, though remains volume up. Documented UOP of 1.9 L for the admission with a net negative of 930 mL. Weight 71-->72.4 kg. Sodium 132-->126, BUN/SCr 30/2.06-->36/1.97. Potassium 4.9. Anxious.   Inpatient Medications    Scheduled Meds: . docusate sodium  100 mg Oral BID  . enoxaparin (LOVENOX) injection  40 mg Subcutaneous Q24H  . furosemide  40 mg Intravenous BID  . gabapentin  100 mg Oral TID  . multivitamin with minerals  1 tablet Oral Daily  . nicotine  21 mg Transdermal Daily  . potassium chloride  20 mEq Oral Daily  . sodium chloride flush  3 mL Intravenous Q12H   Continuous Infusions: . sodium chloride     PRN Meds: sodium chloride, acetaminophen, ALPRAZolam, HYDROcodone-acetaminophen, ondansetron (ZOFRAN) IV, polyethylene glycol, sodium chloride flush, traMADol, traZODone   Vital Signs    Vitals:   02/01/18 0732 02/01/18 1644 02/02/18 0423 02/02/18 0755  BP: 102/82 97/74 128/85 109/79  Pulse: 90 93 (!) 101 (!) 101  Resp: 19 20 18 20   Temp: 98.4 F (36.9 C)  98.7 F (37.1 C) 98.3 F (36.8 C)  TempSrc: Oral  Oral Oral  SpO2: 94% 95% 95% 93%  Weight:   72.4 kg   Height:        Intake/Output Summary (Last 24 hours) at 02/02/2018 1356 Last data filed at 02/02/2018 1356 Gross per 24 hour  Intake 840 ml  Output 1650 ml  Net -810 ml   Filed Weights   01/31/18 1741 02/01/18 0419 02/02/18 0423  Weight: 70.5 kg 71 kg 72.4 kg    Telemetry    NSR with occasional OVCs with a rare ventricular couplet - Personally Reviewed  ECG    n/a - Personally Reviewed  Physical Exam   GEN: No acute distress.   Neck: JVD elevated to the angle of the mandible. Cardiac: RRR, no murmurs, rubs, or gallops.  Respiratory: Diminished breath sounds bilaterally with crackles along the bilateral bases.  GI: Soft, nontender,  non-distended.   MS: 1-2+ bilateral lower extremity edema to the knees; No deformity. Neuro:  Alert and oriented x 3; Nonfocal.  Psych: Normal affect.  Labs    Chemistry Recent Labs  Lab 01/29/18 1520 01/31/18 1908 02/01/18 0438 02/02/18 0517  NA 131*  --  132* 126*  K 4.2  --  4.8 4.9  CL 88*  --  90* 86*  CO2 33*  --  32 29  GLUCOSE 131*  --  92 147*  BUN 30*  --  30* 36*  CREATININE 1.67* 1.77* 2.06* 1.97*  CALCIUM 10.0  --  9.6 9.5  GFRNONAA 38* 35* 29* 31*  GFRAA 44* 41* 34* 36*  ANIONGAP 10  --  10 11     Hematology Recent Labs  Lab 01/31/18 1908  WBC 5.1  RBC 3.94*  HGB 10.4*  HCT 33.3*  MCV 84.5  MCH 26.4  MCHC 31.2  RDW 16.5*  PLT 202    Cardiac EnzymesNo results for input(s): TROPONINI in the last 168 hours. No results for input(s): TROPIPOC in the last 168 hours.   BNPNo results for input(s): BNP, PROBNP in the last 168 hours.   DDimer No results for input(s): DDIMER in the last 168 hours.   Radiology    No results found.  Cardiac Studies   Echo 01/25/2018: Study Conclusions  -  Left ventricle: The cavity size was moderately dilated. Systolic   function was severely reduced. The estimated ejection fraction   was in the range of 20% to 25%. Diffuse hypokinesis. Hypokinesis   of the inferior myocardium. Hypokinesis of the inferolateral   myocardium. Hypokinesis of the lateral myocardium. Features are   consistent with a pseudonormal left ventricular filling pattern,   with concomitant abnormal relaxation and increased filling   pressure (grade 2 diastolic dysfunction). - Aortic valve: There was moderate stenosis by mean gradient and   peak velocity. Possibly severe by estimated AVA, may be   under-estimated secondary to severely reduced LV function. There   was mild to moderate regurgitation. Peak velocity (S): 342 cm/s.   Mean gradient (S): 27 mm Hg. Valve area (VTI): 0.52 cm^2. - Mitral valve: There was moderate regurgitation. Valve  area by   continuity equation (using LVOT flow): 1.47 cm^2. - Left atrium: The atrium was moderately to severely dilated. - Pulmonary arteries: Systolic pressure was moderately elevated. PA   peak pressure: 57 mm Hg (S). - Inferior vena cava: The vessel was dilated. The respirophasic   diameter changes were blunted (< 50%), consistent with elevated   central venous pressure.  Impressions:  - Left pleural effusion noted, 5.7 cm.  Patient Profile     77 y.o. male with history of acute systolic CHF, moderate to severe aortic stenosis and mitral regurgitation, CKD stage III, anemia, tobacco abuse, narcotic dependency in remission, HTN, and arthritis who was admitted on 11/20 with volume overload complicated by incarcerated right inguinal hernia.   Assessment & Plan    1. Acute systolic CHF: -Remains significantly volume up -Sodium is down trending with IV diuresis -Check stat bmet this afternoon, if sodium is down trending, will hold afternoon dose of Lasix and give a dose of tolvaptan -May need to augment diuresis with milrinone  -Not currently on beta blocker given decompensated heart failure, start when able -Not currently on ACEi/ARB/Enresto.spironlactone secondary to acute on CKD -Escalate evidence-based heart failure therapy as vitals and renal function allow -Would ideally like to diurese over the weekend, followed by [facility] (preferrably prior to discharge), however he is very anxious with being admitted (has PTSD from [place] War) -With his PTSD, we may need to defer cath until outpatient (prefer to have this done inpatient) -CHF education -Daily weights with strict I/O  2. Moderate to severe aortic stenosis with mitral regurgitation: -Possibly underestimated in the setting of his cardiomyopathy -Plan for cath prior to discharge as above  3. Acute on CKD stage III: -Improving with diuresis -Continue to monitor closely   4. Hyponatremia: -Has previously been noted to have  a sodium of 127 on 11/12 -  5. Incarcerated hernia: -Status post reduction  -Surgery on board without   For questions or updates, please contact [facility] Please consult [email or web address] for contact info under Cardiology/STEMI.    Signed, Christell Faith, PA-C [place] Pager: [phone] 02/02/2018, 1:56 PM

## 2018-02-02 NOTE — Plan of Care (Signed)
  Problem: Elimination: Goal: Will not experience complications related to bowel motility Outcome: Progressing   

## 2018-02-02 NOTE — Telephone Encounter (Signed)
Called Pt to schedule an appt for Feb 13th at 2:20pm and told him to arrive at least 10 minutes early. (Pt stated he already knows the drill)

## 2018-02-03 DIAGNOSIS — E871 Hypo-osmolality and hyponatremia: Secondary | ICD-10-CM

## 2018-02-03 DIAGNOSIS — I5043 Acute on chronic combined systolic (congestive) and diastolic (congestive) heart failure: Secondary | ICD-10-CM

## 2018-02-03 LAB — BASIC METABOLIC PANEL
ANION GAP: 11 (ref 5–15)
BUN: 39 mg/dL — ABNORMAL HIGH (ref 8–23)
CALCIUM: 9.4 mg/dL (ref 8.9–10.3)
CO2: 31 mmol/L (ref 22–32)
Chloride: 84 mmol/L — ABNORMAL LOW (ref 98–111)
Creatinine, Ser: 1.93 mg/dL — ABNORMAL HIGH (ref 0.61–1.24)
GFR, EST AFRICAN AMERICAN: 37 mL/min — AB (ref >60)
GFR, EST NON AFRICAN AMERICAN: 32 mL/min — AB (ref >60)
Glucose, Bld: 108 mg/dL — ABNORMAL HIGH (ref 70–99)
Potassium: 4.7 mmol/L (ref 3.5–5.1)
Sodium: 126 mmol/L — ABNORMAL LOW (ref 135–145)

## 2018-02-03 MED ORDER — BUDESONIDE 0.25 MG/2ML IN SUSP
0.2500 mg | Freq: Two times a day (BID) | RESPIRATORY_TRACT | Status: DC
  Administered 2018-02-03 – 2018-02-05 (×4): 0.25 mg via RESPIRATORY_TRACT
  Filled 2018-02-03 (×4): qty 2

## 2018-02-03 MED ORDER — TOLVAPTAN 15 MG PO TABS
15.0000 mg | ORAL_TABLET | Freq: Once | ORAL | Status: AC
  Administered 2018-02-03: 15 mg via ORAL
  Filled 2018-02-03: qty 1

## 2018-02-03 MED ORDER — IPRATROPIUM-ALBUTEROL 0.5-2.5 (3) MG/3ML IN SOLN
3.0000 mL | Freq: Four times a day (QID) | RESPIRATORY_TRACT | Status: DC
  Administered 2018-02-03 – 2018-02-05 (×8): 3 mL via RESPIRATORY_TRACT
  Filled 2018-02-03 (×8): qty 3

## 2018-02-03 NOTE — Progress Notes (Signed)
Progress Note  Patient Name: George Washington. Date of Encounter: 02/03/2018  Primary Cardiologist: End  Subjective   SOB and lower extremity improving, though remains volume up. He feels anxious on occasion but much improved with his breathing improved. Discussed possibility of heart cath on Monday, he thinks he will be able to tolerate.  Inpatient Medications    Scheduled Meds: . docusate sodium  100 mg Oral BID  . enoxaparin (LOVENOX) injection  40 mg Subcutaneous Q24H  . gabapentin  100 mg Oral TID  . multivitamin with minerals  1 tablet Oral Daily  . nicotine  21 mg Transdermal Daily  . potassium chloride  20 mEq Oral Daily  . sodium chloride flush  3 mL Intravenous Q12H  . tolvaptan  15 mg Oral Once   Continuous Infusions: . sodium chloride     PRN Meds: sodium chloride, acetaminophen, ALPRAZolam, bisacodyl, bisacodyl, HYDROcodone-acetaminophen, ondansetron (ZOFRAN) IV, polyethylene glycol, sodium chloride flush, traMADol, traZODone   Vital Signs    Vitals:   02/02/18 1537 02/02/18 1922 02/03/18 0505 02/03/18 0756  BP: 99/73 111/75 106/76 109/81  Pulse: (!) 101 98 100 92  Resp: 20 17  18   Temp: 98.2 F (36.8 C) 98.7 F (37.1 C) 98.1 F (36.7 C) 98.1 F (36.7 C)  TempSrc: Oral Oral Oral Oral  SpO2: 96% 96% 94% 94%  Weight:   72.3 kg   Height:        Intake/Output Summary (Last 24 hours) at 02/03/2018 1213 Last data filed at 02/03/2018 1014 Gross per 24 hour  Intake 960 ml  Output 1875 ml  Net -915 ml   Filed Weights   02/01/18 0419 02/02/18 0423 02/03/18 0505  Weight: 71 kg 72.4 kg 72.3 kg    Telemetry    NSR/sinus tachycardia with occasional PVCs - Personally Reviewed  ECG    No new since 11/20 - Personally Reviewed  Physical Exam   GEN: No acute distress.   Neck: JVD elevated to the just above clavicle at 90 degrees Cardiac: RRR, no murmurs, rubs, or gallops.  Respiratory: Rhonchorous bilaterally GI: Soft, nontender, non-distended.     MS: 1-2+ bilateral lower extremity edema worst at ankles; No deformity. Neuro:  Alert and oriented x 3; Nonfocal.  Psych: Normal affect.  Labs    Chemistry Recent Labs  Lab 02/02/18 0517 02/02/18 1454 02/03/18 0609  NA 126* 125* 126*  K 4.9 4.7 4.7  CL 86* 85* 84*  CO2 29 32 31  GLUCOSE 147* 121* 108*  BUN 36* 36* 39*  CREATININE 1.97* 1.93* 1.93*  CALCIUM 9.5 9.7 9.4  GFRNONAA 31* 32* 32*  GFRAA 36* 37* 37*  ANIONGAP 11 8 11      Hematology Recent Labs  Lab 01/31/18 1908  WBC 5.1  RBC 3.94*  HGB 10.4*  HCT 33.3*  MCV 84.5  MCH 26.4  MCHC 31.2  RDW 16.5*  PLT 202    Cardiac EnzymesNo results for input(s): TROPONINI in the last 168 hours. No results for input(s): TROPIPOC in the last 168 hours.   BNPNo results for input(s): BNP, PROBNP in the last 168 hours.   DDimer No results for input(s): DDIMER in the last 168 hours.   Radiology    No results found.  Cardiac Studies   Echo 01/25/2018: Study Conclusions  - Left ventricle: The cavity size was moderately dilated. Systolic   function was severely reduced. The estimated ejection fraction   was in the range of 20% to 25%.  Diffuse hypokinesis. Hypokinesis   of the inferior myocardium. Hypokinesis of the inferolateral   myocardium. Hypokinesis of the lateral myocardium. Features are   consistent with a pseudonormal left ventricular filling pattern,   with concomitant abnormal relaxation and increased filling   pressure (grade 2 diastolic dysfunction). - Aortic valve: There was moderate stenosis by mean gradient and   peak velocity. Possibly severe by estimated AVA, may be   under-estimated secondary to severely reduced LV function. There   was mild to moderate regurgitation. Peak velocity (S): 342 cm/s.   Mean gradient (S): 27 mm Hg. Valve area (VTI): 0.52 cm^2. - Mitral valve: There was moderate regurgitation. Valve area by   continuity equation (using LVOT flow): 1.47 cm^2. - Left atrium: The  atrium was moderately to severely dilated. - Pulmonary arteries: Systolic pressure was moderately elevated. PA   peak pressure: 57 mm Hg (S). - Inferior vena cava: The vessel was dilated. The respirophasic   diameter changes were blunted (< 50%), consistent with elevated   central venous pressure.  Impressions:  - Left pleural effusion noted, 5.7 cm.  Patient Profile     77 y.o. male with history of acute systolic CHF, moderate to severe aortic stenosis and mitral regurgitation, CKD stage III, anemia, tobacco abuse, narcotic dependency in remission, HTN, and arthritis who was admitted on 11/20 with volume overload complicated by incarcerated right inguinal hernia.   Assessment & Plan    1. Acute systolic CHF: -Volume improving, though still with edema in ankles -sodium low but stable. Primary team starting tolvaptan to allow for further diuresis -Not currently on beta blocker given decompensated heart failure, start when able -Not currently on ACEi/ARB/Enresto.spironlactone secondary to acute on CKD -Escalate evidence-based heart failure therapy as vitals and renal function allow -Will plan to continue diuresis, with [facility] 11/25 -CHF education -Daily weights with strict I/O -uses symbicort BID at home, ordered formulary substitution here given rhonchi on exam  2. Moderate to severe aortic stenosis with mitral regurgitation: -Possibly underestimated in the setting of his cardiomyopathy -Plan for cath prior to discharge as above  3. Acute on CKD stage III: -Continue to monitor closely   4. Hyponatremia: -low but stable, starting tolvaptan today to allow for continued diuresis  5. Incarcerated hernia: -Status post reduction  -Surgery on board  For questions or updates, please contact [place] Please consult [email or web address] for contact info under Cardiology/STEMI.   Signed, Buford Dresser, MD, PhD [facility]  [address] [place], [place] [address] [phone] 02/03/2018, 12:13 PM

## 2018-02-03 NOTE — Plan of Care (Signed)
  Problem: Health Behavior/Discharge Planning: Goal: Ability to manage health-related needs will improve Outcome: Progressing Note:  Dr. Leslye Peer Devincenzi-ordered patient's Tolvaptan today to hopefully raise sodium levels and assist with diuresis. Will continue to monitor sodium level(s). Wenda Low [facility]

## 2018-02-03 NOTE — Progress Notes (Signed)
Patient ID: George Dy., male   DOB: February 11, 1941, 77 y.o.   MRN: [id number]  [employer] PROGRESS NOTE  George Dy. LYY:[id number] DOB: 1940/05/26 DOA: 01/31/2018 PCP: George Green, FNP  HPI/Subjective: Patient still feels some shortness of breath.  Still has swelling in his lower extremities.  He states he quit smoking last week.  Objective: Vitals:   02/03/18 0505 02/03/18 0756  BP: 106/76 109/81  Pulse: 100 92  Resp:  18  Temp: 98.1 F (36.7 C) 98.1 F (36.7 C)  SpO2: 94% 94%    Filed Weights   02/01/18 0419 02/02/18 0423 02/03/18 0505  Weight: 71 kg 72.4 kg 72.3 kg    ROS: Review of Systems  Constitutional: Negative for chills and fever.  Eyes: Negative for blurred vision.  Respiratory: Positive for shortness of breath. Negative for cough.   Cardiovascular: Negative for chest pain.  Gastrointestinal: Negative for abdominal pain, constipation, diarrhea, nausea and vomiting.  Genitourinary: Negative for dysuria.  Musculoskeletal: Negative for joint pain.  Neurological: Negative for dizziness and headaches.   Exam: Physical Exam  Constitutional: He is oriented to person, place, and time.  HENT:  Nose: No mucosal edema.  Mouth/Throat: No oropharyngeal exudate or posterior oropharyngeal edema.  Eyes: Pupils are equal, round, and reactive to light. Conjunctivae, EOM and lids are normal.  Neck: No JVD present. Carotid bruit is not present. No edema present. No thyroid mass and no thyromegaly present.  Cardiovascular: S1 normal and S2 normal. Exam reveals no gallop.  No murmur heard. Pulses:      Dorsalis pedis pulses are 2+ on the right side, and 2+ on the left side.  Respiratory: No respiratory distress. He has no wheezes. He has rhonchi in the right lower field and the left lower field. He has no rales.  GI: Soft. Bowel sounds are normal. There is no tenderness.  Musculoskeletal:       Right ankle: He exhibits swelling.       Left ankle: He exhibits  swelling.  Lymphadenopathy:    He has no cervical adenopathy.  Neurological: He is alert and oriented to person, place, and time. No cranial nerve deficit.  Skin: Skin is warm. No rash noted. Nails show no clubbing.  Psychiatric: He has a normal mood and affect.      Data Reviewed: Basic Metabolic Panel: Recent Labs  Lab 01/29/18 1520 01/31/18 1908 02/01/18 0438 02/02/18 0517 02/02/18 1454 02/03/18 0609  NA 131*  --  132* 126* 125* 126*  K 4.2  --  4.8 4.9 4.7 4.7  CL 88*  --  90* 86* 85* 84*  CO2 33*  --  32 29 32 31  GLUCOSE 131*  --  92 147* 121* 108*  BUN 30*  --  30* 36* 36* 39*  CREATININE 1.67* 1.77* 2.06* 1.97* 1.93* 1.93*  CALCIUM 10.0  --  9.6 9.5 9.7 9.4   CBC: Recent Labs  Lab 01/31/18 1908  WBC 5.1  HGB 10.4*  HCT 33.3*  MCV 84.5  PLT 202    Scheduled Meds: . budesonide  0.25 mg Nebulization BID  . docusate sodium  100 mg Oral BID  . enoxaparin (LOVENOX) injection  40 mg Subcutaneous Q24H  . gabapentin  100 mg Oral TID  . multivitamin with minerals  1 tablet Oral Daily  . nicotine  21 mg Transdermal Daily  . potassium chloride  20 mEq Oral Daily  . sodium chloride flush  3 mL Intravenous Q12H  Continuous Infusions: . sodium chloride      Assessment/Plan:  1. Acute on chronic systolic congestive heart failure.  EF 20 to 25%.  Diuresis today with tolvaptan.  Cardiology would look to do a cardiac catheterization at some point. 2. Hyponatremia.  Give another dose of tolvaptan today and serial sodiums.  Patient stated he urinated well with the tolvaptan yesterday. 3. Moderate aortic stenosis and moderate mitral regurgitation. 4. Acute on chronic kidney disease stage III.  Monitor closely with diuresis. 5. Recently quit smoking on nicotine patch  Code Status:     Code Status Orders  (From admission, onward)         Start     Ordered   01/31/18 1823  Full code  Continuous     01/31/18 1824        Code Status History    This patient  has a current code status but no historical code status.    Advance Directive Documentation     Most Recent Value  Type of Advance Directive  Healthcare Power of Attorney, Living will  Pre-existing out of facility DNR order (yellow form or pink MOST form)  -  "MOST" Form in Place?  -     Disposition Plan: To be determined  Consultants:  [facility] cardiology  Time spent: 28 minutes  [place]

## 2018-02-04 LAB — BASIC METABOLIC PANEL
Anion gap: 10 (ref 5–15)
Anion gap: 15 (ref 5–15)
BUN: 42 mg/dL — AB (ref 8–23)
BUN: 43 mg/dL — AB (ref 8–23)
CALCIUM: 9.4 mg/dL (ref 8.9–10.3)
CALCIUM: 9.7 mg/dL (ref 8.9–10.3)
CO2: 30 mmol/L (ref 22–32)
CO2: 28 mmol/L (ref 22–32)
CREATININE: 1.96 mg/dL — AB (ref 0.61–1.24)
CREATININE: 2.05 mg/dL — AB (ref 0.61–1.24)
Chloride: 85 mmol/L — ABNORMAL LOW (ref 98–111)
Chloride: 81 mmol/L — ABNORMAL LOW (ref 98–111)
GFR calc Af Amer: 36 mL/min — ABNORMAL LOW (ref >60)
GFR calc Af Amer: 34 mL/min — ABNORMAL LOW (ref >60)
GFR, EST NON AFRICAN AMERICAN: 31 mL/min — AB (ref >60)
GFR, EST NON AFRICAN AMERICAN: 30 mL/min — AB (ref >60)
Glucose, Bld: 111 mg/dL — ABNORMAL HIGH (ref 70–99)
Glucose, Bld: 161 mg/dL — ABNORMAL HIGH (ref 70–99)
POTASSIUM: 5.4 mmol/L — AB (ref 3.5–5.1)
Potassium: 5.5 mmol/L — ABNORMAL HIGH (ref 3.5–5.1)
SODIUM: 125 mmol/L — AB (ref 135–145)
SODIUM: 124 mmol/L — AB (ref 135–145)

## 2018-02-04 LAB — SODIUM: SODIUM: 124 mmol/L — AB (ref 135–145)

## 2018-02-04 MED ORDER — SENNOSIDES-DOCUSATE SODIUM 8.6-50 MG PO TABS
1.0000 | ORAL_TABLET | Freq: Every day | ORAL | Status: DC | PRN
  Administered 2018-02-04: 1 via ORAL
  Filled 2018-02-04: qty 1

## 2018-02-04 MED ORDER — ALPRAZOLAM 0.5 MG PO TABS
0.5000 mg | ORAL_TABLET | Freq: Three times a day (TID) | ORAL | Status: DC | PRN
  Administered 2018-02-04 – 2018-02-05 (×3): 0.5 mg via ORAL
  Filled 2018-02-04 (×3): qty 1

## 2018-02-04 MED ORDER — SODIUM CHLORIDE 0.9 % IV SOLN
INTRAVENOUS | Status: DC
  Administered 2018-02-05: 06:00:00 via INTRAVENOUS

## 2018-02-04 MED ORDER — SODIUM POLYSTYRENE SULFONATE 15 GM/60ML PO SUSP
30.0000 g | Freq: Once | ORAL | Status: AC
  Administered 2018-02-04: 30 g via ORAL
  Filled 2018-02-04: qty 120

## 2018-02-04 MED ORDER — ASPIRIN 81 MG PO CHEW
81.0000 mg | CHEWABLE_TABLET | ORAL | Status: AC
  Administered 2018-02-05: 81 mg via ORAL
  Filled 2018-02-04: qty 1

## 2018-02-04 MED ORDER — SODIUM CHLORIDE 0.9 % IV SOLN: 250.0000 mL | INTRAVENOUS | Status: DC | PRN

## 2018-02-04 MED ORDER — TOLVAPTAN 15 MG PO TABS
30.0000 mg | ORAL_TABLET | Freq: Once | ORAL | Status: AC
  Administered 2018-02-04: 30 mg via ORAL
  Filled 2018-02-04: qty 2

## 2018-02-04 MED ORDER — TRAZODONE HCL 100 MG PO TABS
100.0000 mg | ORAL_TABLET | Freq: Every day | ORAL | Status: DC
  Administered 2018-02-04: 100 mg via ORAL
  Filled 2018-02-04: qty 1

## 2018-02-04 MED ORDER — SODIUM BICARBONATE 8.4 % IV SOLN
INTRAVENOUS | Status: DC
  Administered 2018-02-04 – 2018-02-05 (×2): via INTRAVENOUS
  Filled 2018-02-04 (×3): qty 150

## 2018-02-04 MED ORDER — SODIUM CHLORIDE 0.9% FLUSH
3.0000 mL | Freq: Two times a day (BID) | INTRAVENOUS | Status: DC
  Administered 2018-02-04: 3 mL via INTRAVENOUS

## 2018-02-04 MED ORDER — SODIUM CHLORIDE 0.9% FLUSH: 3.0000 mL | INTRAVENOUS | Status: DC | PRN

## 2018-02-04 NOTE — Progress Notes (Signed)
Patient ID: George Dy., male   DOB: 01-Jun-1940, 77 y.o.   MRN: [id number]  [employer] PROGRESS NOTE  George Dy. LOV:[id number] DOB: [birth year] DOA: 01/31/2018 PCP: Jodelle Green, FNP  HPI/Subjective: Patient feels weak.  Some shortness of breath and cough.  No chest pain.  Very anxious.  Did not sleep well last night.  Objective: Vitals:   02/04/18 0749 02/04/18 0757  BP:  111/82  Pulse:  98  Resp:  18  Temp:  (!) 97.4 F (36.3 C)  SpO2: 95% 100%    Filed Weights   02/02/18 0423 02/03/18 0505 02/04/18 0526  Weight: 72.4 kg 72.3 kg 72.7 kg    ROS: Review of Systems  Constitutional: Negative for chills and fever.  Eyes: Negative for blurred vision.  Respiratory: Positive for cough and shortness of breath.   Cardiovascular: Negative for chest pain.  Gastrointestinal: Positive for constipation. Negative for abdominal pain, diarrhea, nausea and vomiting.  Genitourinary: Negative for dysuria.  Musculoskeletal: Negative for joint pain.  Neurological: Negative for dizziness and headaches.  Psychiatric/Behavioral: The patient is nervous/anxious.    Exam: Physical Exam  Constitutional: He is oriented to person, place, and time.  HENT:  Nose: No mucosal edema.  Mouth/Throat: No oropharyngeal exudate or posterior oropharyngeal edema.  Eyes: Pupils are equal, round, and reactive to light. Conjunctivae, EOM and lids are normal.  Neck: No JVD present. Carotid bruit is not present. No edema present. No thyroid mass and no thyromegaly present.  Cardiovascular: S1 normal and S2 normal. Exam reveals no gallop.  No murmur heard. Pulses:      Dorsalis pedis pulses are 2+ on the right side, and 2+ on the left side.  Respiratory: No respiratory distress. He has decreased breath sounds in the right lower field and the left lower field. He has no wheezes. He has no rhonchi. He has no rales.  GI: Soft. Bowel sounds are normal. There is no tenderness.  Musculoskeletal:   Right ankle: He exhibits swelling.       Left ankle: He exhibits swelling.  Lymphadenopathy:    He has no cervical adenopathy.  Neurological: He is alert and oriented to person, place, and time. No cranial nerve deficit.  Skin: Skin is warm. No rash noted. Nails show no clubbing.  Psychiatric: He has a normal mood and affect.      Data Reviewed: Basic Metabolic Panel: Recent Labs  Lab 02/02/18 0517 02/02/18 1454 02/03/18 0609 02/04/18 0410 02/04/18 0904  NA 126* 125* 126* 125* 124*  K 4.9 4.7 4.7 5.4* 5.5*  CL 86* 85* 84* 85* 81*  CO2 29 32 31 30 28   GLUCOSE 147* 121* 108* 111* 161*  BUN 36* 36* 39* 42* 43*  CREATININE 1.97* 1.93* 1.93* 1.96* 2.05*  CALCIUM 9.5 9.7 9.4 9.4 9.7   CBC: Recent Labs  Lab 01/31/18 1908  WBC 5.1  HGB 10.4*  HCT 33.3*  MCV 84.5  PLT 202    Scheduled Meds: . budesonide  0.25 mg Nebulization BID  . docusate sodium  100 mg Oral BID  . enoxaparin (LOVENOX) injection  40 mg Subcutaneous Q24H  . gabapentin  100 mg Oral TID  . ipratropium-albuterol  3 mL Nebulization Q6H  . multivitamin with minerals  1 tablet Oral Daily  . nicotine  21 mg Transdermal Daily  . sodium chloride flush  3 mL Intravenous Q12H  . sodium polystyrene  30 g Oral Once  . traZODone  100 mg Oral QHS  Continuous Infusions: . sodium chloride    .  sodium bicarbonate  infusion 1000 mL      Assessment/Plan:  1. Acute on chronic systolic congestive heart failure.  EF 20 to 25%.  Cardiology would look to do a cardiac catheterization at some point.  Patient has been given tolvaptan.  Patient will receive IV fluids prior to the cardiac cath. 2. Hyponatremia.  Previously given tolvaptan and no change in sodium levels. 3. Moderate aortic stenosis and moderate mitral regurgitation. 4. Acute on chronic kidney disease stage III.  Appreciate nephrology consult.  Bicarb drip starting tonight. 5. Recently quit smoking on nicotine patch  6. Anxiety and insomnia.  Increase Xanax  dose to 0.5 mg as needed and increase trazodone 200 mg nightly.  Code Status:     Code Status Orders  (From admission, onward)         Start     Ordered   01/31/18 1823  Full code  Continuous     01/31/18 1824        Code Status History    This patient has a current code status but no historical code status.    Advance Directive Documentation     Most Recent Value  Type of Advance Directive  Healthcare Power of Attorney, Living will  Pre-existing out of facility DNR order (yellow form or pink MOST form)  -  "MOST" Form in Place?  -     Disposition Plan: To be determined  Consultants:  [facility] cardiology  Time spent: 29 minutes  [place]

## 2018-02-04 NOTE — Progress Notes (Signed)
[facility]  ROUNDING NOTE   Subjective:   Mr. Chayne Baumgart. admitted to [facility] on 01/31/2018 for Heart Failure  New onset. Planning for left heart catheterization tomorrow.   Patient is well known to me, last seen in the office on 11/02/17.  Recently diagnosed with systolic congestive heart failure, EF 20-25%. Echo on 11/14. He was admitted directly from cardiology office.   Found to have hyponatremia and hypokalemia. Creatinine elevated. Given three doses of tolvaptan. He has diuresed successfully. However not much improvement in sodium.   Patient continues to smoke. Patient endorses regular use of Mobic  Objective:  Vital signs in last 24 hours:  Temp:  [97.4 F (36.3 C)-98.3 F (36.8 C)] 97.4 F (36.3 C) (11/24 0757) Pulse Rate:  [89-98] 98 (11/24 0757) Resp:  [16-18] 18 (11/24 0757) BP: (95-111)/(73-82) 111/82 (11/24 0757) SpO2:  [92 %-100 %] 100 % (11/24 0757) Weight:  [72.7 kg] 72.7 kg (11/24 0526)  Weight change: 0.363 kg Filed Weights   02/02/18 0423 02/03/18 0505 02/04/18 0526  Weight: 72.4 kg 72.3 kg 72.7 kg    Intake/Output: I/O last 3 completed shifts: In: 600 [P.O.:600] Out: 2320 [Urine:2320]   Intake/Output this shift:  Total I/O In: 480 [P.O.:480] Out: -   Physical Exam: General: NAD,   Head: Normocephalic, atraumatic. Moist oral mucosal membranes  Eyes: Anicteric, PERRL  Neck: Supple, trachea midline  Lungs:  Crackles bilaterally  Heart: Regular rate and rhythm  Abdomen:  Soft, nontender,   Extremities:  ++ peripheral edema.  Neurologic: Nonfocal, moving all four extremities  Skin: No lesions        Basic Metabolic Panel: Recent Labs  Lab 02/02/18 0517 02/02/18 1454 02/03/18 0609 02/04/18 0410 02/04/18 0904  NA 126* 125* 126* 125* 124*  K 4.9 4.7 4.7 5.4* 5.5*  CL 86* 85* 84* 85* 81*  CO2 29 32 31 30 28   GLUCOSE 147* 121* 108* 111* 161*  BUN 36* 36* 39* 42* 43*  CREATININE 1.97* 1.93* 1.93* 1.96* 2.05*  CALCIUM 9.5  9.7 9.4 9.4 9.7    Liver Function Tests: No results for input(s): AST, ALT, ALKPHOS, BILITOT, PROT, ALBUMIN in the last 168 hours. No results for input(s): LIPASE, AMYLASE in the last 168 hours. No results for input(s): AMMONIA in the last 168 hours.  CBC: Recent Labs  Lab 01/31/18 1908  WBC 5.1  HGB 10.4*  HCT 33.3*  MCV 84.5  PLT 202    Cardiac Enzymes: No results for input(s): CKTOTAL, CKMB, CKMBINDEX, TROPONINI in the last 168 hours.  BNP: Invalid input(s): POCBNP  CBG: No results for input(s): GLUCAP in the last 168 hours.  Microbiology: Results for orders placed or performed in visit on 09/27/12  Fecal occult blood, imunochemical (IFOB)     Status: None   Collection Time: 09/27/12 12:06 PM  Result Value Ref Range Status   Fecal Occult Bld Negative Negative Final    Coagulation Studies: No results for input(s): LABPROT, INR in the last 72 hours.  Urinalysis: No results for input(s): COLORURINE, LABSPEC, PHURINE, GLUCOSEU, HGBUR, BILIRUBINUR, KETONESUR, PROTEINUR, UROBILINOGEN, NITRITE, LEUKOCYTESUR in the last 72 hours.  Invalid input(s): APPERANCEUR    Imaging: No results found.   Medications:   . sodium chloride    .  sodium bicarbonate  infusion 1000 mL     . budesonide  0.25 mg Nebulization BID  . docusate sodium  100 mg Oral BID  . enoxaparin (LOVENOX) injection  40 mg Subcutaneous Q24H  .  gabapentin  100 mg Oral TID  . ipratropium-albuterol  3 mL Nebulization Q6H  . multivitamin with minerals  1 tablet Oral Daily  . nicotine  21 mg Transdermal Daily  . sodium chloride flush  3 mL Intravenous Q12H  . traZODone  100 mg Oral QHS   sodium chloride, acetaminophen, ALPRAZolam, bisacodyl, bisacodyl, HYDROcodone-acetaminophen, ondansetron (ZOFRAN) IV, polyethylene glycol, sodium chloride flush, traMADol  Assessment/ Plan:  Mr. Gabrien Mentink. is a 77 y.o. white male with new onset systolic congestive heart failure, hypertension, osteoarthritis,  nephrolithiasis, AAA, tobacco abuse and retinal vein occlusion   1. Acute renal failure on chronic kidney disease stage III: baseline creatinine of 1.67, GFR of 38 on 11/18.  Chronic kidney disease is thought to be secondary to hypertension and NSAIDs (meloxicam) Acute renal failure secondary to acute cardiorenal syndrome.  Scheduled for cardiac catheterization tomorrow  2. Hyponatremia: secondary to congestive heart failure. Patient was taking hydrochlorothiazide in the past Status post three doses of tolvaptan  3. Hyperkalemia: has history of hyperkalemia: iatrogenic secondary to potassium chloride supplements  Plan - start sodium bicarbonate infusion 12 hours before scheduled cardiac procedure and continue after procedure - Discontinue tolvaptan - PO fluid restriction - Continue to hold meloxicam   LOS: 4 Kin Galbraith 11/24/201912:58 PM

## 2018-02-04 NOTE — H&P (View-Only) (Signed)
Progress Note  Patient Name: George Washington. Date of Encounter: 02/04/2018  Primary Cardiologist: End  Subjective   Similar to yesterday overall. No chest pain, breathing stable. Remains with some LE edema. Anxiety stable, feels comfortable with plans for cath tomorrow.  Inpatient Medications    Scheduled Meds: . budesonide  0.25 mg Nebulization BID  . docusate sodium  100 mg Oral BID  . enoxaparin (LOVENOX) injection  40 mg Subcutaneous Q24H  . gabapentin  100 mg Oral TID  . ipratropium-albuterol  3 mL Nebulization Q6H  . multivitamin with minerals  1 tablet Oral Daily  . nicotine  21 mg Transdermal Daily  . sodium chloride flush  3 mL Intravenous Q12H   Continuous Infusions: . sodium chloride    .  sodium bicarbonate  infusion 1000 mL     PRN Meds: sodium chloride, acetaminophen, ALPRAZolam, bisacodyl, bisacodyl, HYDROcodone-acetaminophen, ondansetron (ZOFRAN) IV, polyethylene glycol, sodium chloride flush, traMADol, traZODone   Vital Signs    Vitals:   02/04/18 0206 02/04/18 0526 02/04/18 0749 02/04/18 0757  BP:  100/73  111/82  Pulse:  89  98  Resp:  16  18  Temp:  98.3 F (36.8 C)  (!) 97.4 F (36.3 C)  TempSrc:  Oral  Oral  SpO2: 97% 92% 95% 100%  Weight:  72.7 kg    Height:        Intake/Output Summary (Last 24 hours) at 02/04/2018 1139 Last data filed at 02/04/2018 1025 Gross per 24 hour  Intake 480 ml  Output 1320 ml  Net -840 ml   Filed Weights   02/02/18 0423 02/03/18 0505 02/04/18 0526  Weight: 72.4 kg 72.3 kg 72.7 kg    Telemetry    NSR with occasional PVCs and brief run of ventricular trigeminy - Personally Reviewed  ECG    No new since 11/20 - Personally Reviewed  Physical Exam   GEN: No acute distress.   Neck: JVD not visible at 90 degrees Cardiac: RRR, no murmurs, rubs, or gallops.  Respiratory: Clear, much improved from yesterday GI: Soft, nontender, non-distended.   MS: 1-2+ bilateral lower extremity edema worst at  ankles; No deformity. Neuro:  Alert and oriented x 3; Nonfocal.  Psych: Normal affect.  Labs    Chemistry Recent Labs  Lab 02/03/18 0609 02/04/18 0410 02/04/18 0904  NA 126* 125* 124*  K 4.7 5.4* 5.5*  CL 84* 85* 81*  CO2 31 30 28   GLUCOSE 108* 111* 161*  BUN 39* 42* 43*  CREATININE 1.93* 1.96* 2.05*  CALCIUM 9.4 9.4 9.7  GFRNONAA 32* 31* 30*  GFRAA 37* 36* 34*  ANIONGAP 11 10 15      Hematology Recent Labs  Lab 01/31/18 1908  WBC 5.1  RBC 3.94*  HGB 10.4*  HCT 33.3*  MCV 84.5  MCH 26.4  MCHC 31.2  RDW 16.5*  PLT 202    Cardiac EnzymesNo results for input(s): TROPONINI in the last 168 hours. No results for input(s): TROPIPOC in the last 168 hours.   BNPNo results for input(s): BNP, PROBNP in the last 168 hours.   DDimer No results for input(s): DDIMER in the last 168 hours.   Radiology    No results found.  Cardiac Studies   Echo 01/25/2018: Study Conclusions  - Left ventricle: The cavity size was moderately dilated. Systolic   function was severely reduced. The estimated ejection fraction   was in the range of 20% to 25%. Diffuse hypokinesis. Hypokinesis   of  the inferior myocardium. Hypokinesis of the inferolateral   myocardium. Hypokinesis of the lateral myocardium. Features are   consistent with a pseudonormal left ventricular filling pattern,   with concomitant abnormal relaxation and increased filling   pressure (grade 2 diastolic dysfunction). - Aortic valve: There was moderate stenosis by mean gradient and   peak velocity. Possibly severe by estimated AVA, may be   under-estimated secondary to severely reduced LV function. There   was mild to moderate regurgitation. Peak velocity (S): 342 cm/s.   Mean gradient (S): 27 mm Hg. Valve area (VTI): 0.52 cm^2. - Mitral valve: There was moderate regurgitation. Valve area by   continuity equation (using LVOT flow): 1.47 cm^2. - Left atrium: The atrium was moderately to severely dilated. -  Pulmonary arteries: Systolic pressure was moderately elevated. PA   peak pressure: 57 mm Hg (S). - Inferior vena cava: The vessel was dilated. The respirophasic   diameter changes were blunted (< 50%), consistent with elevated   central venous pressure.  Impressions:  - Left pleural effusion noted, 5.7 cm.  Patient Profile     77 y.o. male with history of acute systolic CHF, moderate to severe aortic stenosis and mitral regurgitation, CKD stage III, anemia, tobacco abuse, narcotic dependency in remission, HTN, and arthritis who was admitted on 11/20 with volume overload complicated by incarcerated right inguinal hernia.   Assessment & Plan    1. Acute systolic CHF: -Volume improving, though still with edema in ankles -Hyponatremia. Received tolvaptan yesterday, but Na down to 124 today. Would hold additional diuretics with plans for cath and decreasing sodium levels.  -Not currently on beta blocker given decompensated heart failure, start when able -Not currently on ACEi/ARB/Enresto.spironlactone secondary to acute on CKD -Escalate evidence-based heart failure therapy as vitals and renal function allow -[facility] 11/25 -CHF education -Daily weights with strict I/O  2. Moderate to severe aortic stenosis with mitral regurgitation: -Possibly underestimated in the setting of his cardiomyopathy -Plan for cath as above  3. Acute on CKD stage III: -Continue to monitor closely. Cr up slightly today. Holding diuresis  Risks and benefits of cardiac catheterization have been discussed with the patient.  These include bleeding, infection, kidney damage, stroke, heart attack, death.  The patient understands these risks and is willing to proceed.  For questions or updates, please contact [place] Please consult [email or web address] for contact info under Cardiology/STEMI.   Signed, Buford Dresser, MD, PhD [facility]  [address] [place], [place]  [address] [phone] 02/04/2018, 11:39 AM

## 2018-02-04 NOTE — Plan of Care (Signed)
Reports no bowel movement since 11/21.  Miralax give daily for 3 doses.

## 2018-02-04 NOTE — Plan of Care (Signed)
Nutrition Education Note  RD consulted for nutrition education regarding CHF.  77 year old male with PMHx of aortic stenosis, arthritis, CKD stage III, HTN, iron deficiency anemia, mitral regurgitation who is admitted with acute on chronic CHF (EF 20-25%).  Met with patient at bedside. He reports he was recently diagnosed with CHF approximately one month ago. The plan is for cardiac cath tomorrow if his kidney function is adequate. He has a good appetite and intake at baseline. Patient works in pharmacology and his wife is a Therapist, sports. She prepares meals in the house and patient reports they have been following a low sodium diet for 20-30 years now. They avoid processed foods and limit eating out. He eats 3 well-balanced meals per day usually with adequate protein intake. He reports he is very active at baseline. He weighs himself daily with a smart scale that syncs with his smart phone.   RD provided "Heart Failure Nutrition Therapy" handout from the Academy of [employer]. Reviewed patient's dietary recall. Provided examples on ways to decrease sodium intake in diet. Discouraged intake of processed foods and use of salt shaker. Encouraged fresh fruits and vegetables as well as whole grain sources of carbohydrates to maximize fiber intake.   RD discussed why it is important for patient to adhere to diet recommendations, and emphasized the role of fluids, foods to avoid, and importance of weighing self daily. Teach back method used.  Expect good compliance.  Body mass index is 23.66 kg/m. Pt meets criteria for normal weight based on current BMI.  Current diet order is Heart Healthy, patient is consuming approximately 75-100% of meals at this time. Labs and medications reviewed. No further nutrition interventions warranted at this time. RD contact information provided. If additional nutrition issues arise, please [place]-consult RD.   Willey Blade, MS, [place], LDN Office: [phone] Pager:  [phone] After Hours/Weekend Pager: [phone]

## 2018-02-04 NOTE — Progress Notes (Signed)
Progress Note  Patient Name: George Washington. Date of Encounter: 02/04/2018  Primary Cardiologist: End  Subjective   Similar to yesterday overall. No chest pain, breathing stable. Remains with some LE edema. Anxiety stable, feels comfortable with plans for cath tomorrow.  Inpatient Medications    Scheduled Meds: . budesonide  0.25 mg Nebulization BID  . docusate sodium  100 mg Oral BID  . enoxaparin (LOVENOX) injection  40 mg Subcutaneous Q24H  . gabapentin  100 mg Oral TID  . ipratropium-albuterol  3 mL Nebulization Q6H  . multivitamin with minerals  1 tablet Oral Daily  . nicotine  21 mg Transdermal Daily  . sodium chloride flush  3 mL Intravenous Q12H   Continuous Infusions: . sodium chloride    .  sodium bicarbonate  infusion 1000 mL     PRN Meds: sodium chloride, acetaminophen, ALPRAZolam, bisacodyl, bisacodyl, HYDROcodone-acetaminophen, ondansetron (ZOFRAN) IV, polyethylene glycol, sodium chloride flush, traMADol, traZODone   Vital Signs    Vitals:   02/04/18 0206 02/04/18 0526 02/04/18 0749 02/04/18 0757  BP:  100/73  111/82  Pulse:  89  98  Resp:  16  18  Temp:  98.3 F (36.8 C)  (!) 97.4 F (36.3 C)  TempSrc:  Oral  Oral  SpO2: 97% 92% 95% 100%  Weight:  72.7 kg    Height:        Intake/Output Summary (Last 24 hours) at 02/04/2018 1139 Last data filed at 02/04/2018 1025 Gross per 24 hour  Intake 480 ml  Output 1320 ml  Net -840 ml   Filed Weights   02/02/18 0423 02/03/18 0505 02/04/18 0526  Weight: 72.4 kg 72.3 kg 72.7 kg    Telemetry    NSR with occasional PVCs and brief run of ventricular trigeminy - Personally Reviewed  ECG    No new since 11/20 - Personally Reviewed  Physical Exam   GEN: No acute distress.   Neck: JVD not visible at 90 degrees Cardiac: RRR, no murmurs, rubs, or gallops.  Respiratory: Clear, much improved from yesterday GI: Soft, nontender, non-distended.   MS: 1-2+ bilateral lower extremity edema worst at  ankles; No deformity. Neuro:  Alert and oriented x 3; Nonfocal.  Psych: Normal affect.  Labs    Chemistry Recent Labs  Lab 02/03/18 0609 02/04/18 0410 02/04/18 0904  NA 126* 125* 124*  K 4.7 5.4* 5.5*  CL 84* 85* 81*  CO2 31 30 28   GLUCOSE 108* 111* 161*  BUN 39* 42* 43*  CREATININE 1.93* 1.96* 2.05*  CALCIUM 9.4 9.4 9.7  GFRNONAA 32* 31* 30*  GFRAA 37* 36* 34*  ANIONGAP 11 10 15      Hematology Recent Labs  Lab 01/31/18 1908  WBC 5.1  RBC 3.94*  HGB 10.4*  HCT 33.3*  MCV 84.5  MCH 26.4  MCHC 31.2  RDW 16.5*  PLT 202    Cardiac EnzymesNo results for input(s): TROPONINI in the last 168 hours. No results for input(s): TROPIPOC in the last 168 hours.   BNPNo results for input(s): BNP, PROBNP in the last 168 hours.   DDimer No results for input(s): DDIMER in the last 168 hours.   Radiology    No results found.  Cardiac Studies   Echo 01/25/2018: Study Conclusions  - Left ventricle: The cavity size was moderately dilated. Systolic   function was severely reduced. The estimated ejection fraction   was in the range of 20% to 25%. Diffuse hypokinesis. Hypokinesis   of  the inferior myocardium. Hypokinesis of the inferolateral   myocardium. Hypokinesis of the lateral myocardium. Features are   consistent with a pseudonormal left ventricular filling pattern,   with concomitant abnormal relaxation and increased filling   pressure (grade 2 diastolic dysfunction). - Aortic valve: There was moderate stenosis by mean gradient and   peak velocity. Possibly severe by estimated AVA, may be   under-estimated secondary to severely reduced LV function. There   was mild to moderate regurgitation. Peak velocity (S): 342 cm/s.   Mean gradient (S): 27 mm Hg. Valve area (VTI): 0.52 cm^2. - Mitral valve: There was moderate regurgitation. Valve area by   continuity equation (using LVOT flow): 1.47 cm^2. - Left atrium: The atrium was moderately to severely dilated. -  Pulmonary arteries: Systolic pressure was moderately elevated. PA   peak pressure: 57 mm Hg (S). - Inferior vena cava: The vessel was dilated. The respirophasic   diameter changes were blunted (< 50%), consistent with elevated   central venous pressure.  Impressions:  - Left pleural effusion noted, 5.7 cm.  Patient Profile     77 y.o. male with history of acute systolic CHF, moderate to severe aortic stenosis and mitral regurgitation, CKD stage III, anemia, tobacco abuse, narcotic dependency in remission, HTN, and arthritis who was admitted on 11/20 with volume overload complicated by incarcerated right inguinal hernia.   Assessment & Plan    1. Acute systolic CHF: -Volume improving, though still with edema in ankles -Hyponatremia. Received tolvaptan yesterday, but Na down to 124 today. Would hold additional diuretics with plans for cath and decreasing sodium levels.  -Not currently on beta blocker given decompensated heart failure, start when able -Not currently on ACEi/ARB/Enresto.spironlactone secondary to acute on CKD -Escalate evidence-based heart failure therapy as vitals and renal function allow -[facility] 11/25 -CHF education -Daily weights with strict I/O  2. Moderate to severe aortic stenosis with mitral regurgitation: -Possibly underestimated in the setting of his cardiomyopathy -Plan for cath as above  3. Acute on CKD stage III: -Continue to monitor closely. Cr up slightly today. Holding diuresis  Risks and benefits of cardiac catheterization have been discussed with the patient.  These include bleeding, infection, kidney damage, stroke, heart attack, death.  The patient understands these risks and is willing to proceed.  For questions or updates, please contact [place] Please consult [email or web address] for contact info under Cardiology/STEMI.   Signed, Buford Dresser, MD, PhD [facility]  [address] [place], [place]  [address] [phone] 02/04/2018, 11:39 AM

## 2018-02-05 ENCOUNTER — Encounter
Admission: RE | Disposition: A | Discharge status: Skilled Nursing Facility | Payer: Self-pay | Source: Ambulatory Visit | Attending: Specialist

## 2018-02-05 ENCOUNTER — Inpatient Hospital Stay (HOSPITAL_COMMUNITY)
Admission: AD | Admit: 2018-02-05 | Discharge: 2018-03-14 | DRG: 270 | Disposition: E | Payer: Medicare Other | Source: Other Acute Inpatient Hospital | Attending: Cardiology | Admitting: Cardiology

## 2018-02-05 ENCOUNTER — Encounter: Payer: Self-pay | Admitting: Cardiovascular Disease

## 2018-02-05 DIAGNOSIS — R197 Diarrhea, unspecified: Secondary | ICD-10-CM | POA: Diagnosis not present

## 2018-02-05 DIAGNOSIS — F419 Anxiety disorder, unspecified: Secondary | ICD-10-CM | POA: Diagnosis present

## 2018-02-05 DIAGNOSIS — Y95 Nosocomial condition: Secondary | ICD-10-CM | POA: Diagnosis present

## 2018-02-05 DIAGNOSIS — B965 Pseudomonas (aeruginosa) (mallei) (pseudomallei) as the cause of diseases classified elsewhere: Secondary | ICD-10-CM | POA: Diagnosis present

## 2018-02-05 DIAGNOSIS — D638 Anemia in other chronic diseases classified elsewhere: Secondary | ICD-10-CM | POA: Diagnosis present

## 2018-02-05 DIAGNOSIS — N184 Chronic kidney disease, stage 4 (severe): Secondary | ICD-10-CM | POA: Diagnosis present

## 2018-02-05 DIAGNOSIS — Z4682 Encounter for fitting and adjustment of non-vascular catheter: Secondary | ICD-10-CM | POA: Diagnosis not present

## 2018-02-05 DIAGNOSIS — I5043 Acute on chronic combined systolic (congestive) and diastolic (congestive) heart failure: Secondary | ICD-10-CM | POA: Diagnosis not present

## 2018-02-05 DIAGNOSIS — I350 Nonrheumatic aortic (valve) stenosis: Secondary | ICD-10-CM

## 2018-02-05 DIAGNOSIS — I080 Rheumatic disorders of both mitral and aortic valves: Secondary | ICD-10-CM | POA: Diagnosis present

## 2018-02-05 DIAGNOSIS — F17210 Nicotine dependence, cigarettes, uncomplicated: Secondary | ICD-10-CM | POA: Diagnosis present

## 2018-02-05 DIAGNOSIS — I5023 Acute on chronic systolic (congestive) heart failure: Secondary | ICD-10-CM | POA: Diagnosis not present

## 2018-02-05 DIAGNOSIS — I2582 Chronic total occlusion of coronary artery: Secondary | ICD-10-CM | POA: Diagnosis present

## 2018-02-05 DIAGNOSIS — I255 Ischemic cardiomyopathy: Secondary | ICD-10-CM | POA: Diagnosis present

## 2018-02-05 DIAGNOSIS — J9621 Acute and chronic respiratory failure with hypoxia: Secondary | ICD-10-CM | POA: Diagnosis not present

## 2018-02-05 DIAGNOSIS — E871 Hypo-osmolality and hyponatremia: Secondary | ICD-10-CM | POA: Diagnosis present

## 2018-02-05 DIAGNOSIS — I2510 Atherosclerotic heart disease of native coronary artery without angina pectoris: Secondary | ICD-10-CM | POA: Diagnosis present

## 2018-02-05 DIAGNOSIS — D509 Iron deficiency anemia, unspecified: Secondary | ICD-10-CM | POA: Diagnosis present

## 2018-02-05 DIAGNOSIS — I469 Cardiac arrest, cause unspecified: Secondary | ICD-10-CM | POA: Diagnosis not present

## 2018-02-05 DIAGNOSIS — N170 Acute kidney failure with tubular necrosis: Secondary | ICD-10-CM | POA: Diagnosis present

## 2018-02-05 DIAGNOSIS — J90 Pleural effusion, not elsewhere classified: Secondary | ICD-10-CM | POA: Diagnosis not present

## 2018-02-05 DIAGNOSIS — I272 Pulmonary hypertension, unspecified: Secondary | ICD-10-CM | POA: Diagnosis present

## 2018-02-05 DIAGNOSIS — I471 Supraventricular tachycardia: Secondary | ICD-10-CM | POA: Diagnosis not present

## 2018-02-05 DIAGNOSIS — I509 Heart failure, unspecified: Secondary | ICD-10-CM

## 2018-02-05 DIAGNOSIS — J811 Chronic pulmonary edema: Secondary | ICD-10-CM | POA: Diagnosis not present

## 2018-02-05 DIAGNOSIS — J440 Chronic obstructive pulmonary disease with acute lower respiratory infection: Secondary | ICD-10-CM | POA: Diagnosis present

## 2018-02-05 DIAGNOSIS — J189 Pneumonia, unspecified organism: Secondary | ICD-10-CM | POA: Diagnosis present

## 2018-02-05 DIAGNOSIS — Z79899 Other long term (current) drug therapy: Secondary | ICD-10-CM

## 2018-02-05 DIAGNOSIS — M1990 Unspecified osteoarthritis, unspecified site: Secondary | ICD-10-CM | POA: Diagnosis present

## 2018-02-05 DIAGNOSIS — E875 Hyperkalemia: Secondary | ICD-10-CM | POA: Diagnosis not present

## 2018-02-05 DIAGNOSIS — I130 Hypertensive heart and chronic kidney disease with heart failure and stage 1 through stage 4 chronic kidney disease, or unspecified chronic kidney disease: Secondary | ICD-10-CM | POA: Diagnosis present

## 2018-02-05 DIAGNOSIS — F1121 Opioid dependence, in remission: Secondary | ICD-10-CM | POA: Diagnosis present

## 2018-02-05 DIAGNOSIS — Z66 Do not resuscitate: Secondary | ICD-10-CM | POA: Diagnosis not present

## 2018-02-05 DIAGNOSIS — B9562 Methicillin resistant Staphylococcus aureus infection as the cause of diseases classified elsewhere: Secondary | ICD-10-CM | POA: Diagnosis present

## 2018-02-05 DIAGNOSIS — N183 Chronic kidney disease, stage 3 unspecified: Secondary | ICD-10-CM | POA: Diagnosis present

## 2018-02-05 DIAGNOSIS — Z791 Long term (current) use of non-steroidal anti-inflammatories (NSAID): Secondary | ICD-10-CM

## 2018-02-05 DIAGNOSIS — Z452 Encounter for adjustment and management of vascular access device: Secondary | ICD-10-CM | POA: Diagnosis not present

## 2018-02-05 DIAGNOSIS — R570 Cardiogenic shock: Secondary | ICD-10-CM | POA: Diagnosis not present

## 2018-02-05 DIAGNOSIS — Z95828 Presence of other vascular implants and grafts: Secondary | ICD-10-CM

## 2018-02-05 DIAGNOSIS — Z888 Allergy status to other drugs, medicaments and biological substances status: Secondary | ICD-10-CM

## 2018-02-05 DIAGNOSIS — Z952 Presence of prosthetic heart valve: Secondary | ICD-10-CM | POA: Diagnosis not present

## 2018-02-05 DIAGNOSIS — I420 Dilated cardiomyopathy: Secondary | ICD-10-CM | POA: Diagnosis present

## 2018-02-05 DIAGNOSIS — I25118 Atherosclerotic heart disease of native coronary artery with other forms of angina pectoris: Secondary | ICD-10-CM | POA: Diagnosis not present

## 2018-02-05 DIAGNOSIS — J9690 Respiratory failure, unspecified, unspecified whether with hypoxia or hypercapnia: Secondary | ICD-10-CM | POA: Diagnosis not present

## 2018-02-05 DIAGNOSIS — Z8249 Family history of ischemic heart disease and other diseases of the circulatory system: Secondary | ICD-10-CM

## 2018-02-05 DIAGNOSIS — R0602 Shortness of breath: Secondary | ICD-10-CM | POA: Diagnosis not present

## 2018-02-05 DIAGNOSIS — I10 Essential (primary) hypertension: Secondary | ICD-10-CM | POA: Diagnosis present

## 2018-02-05 DIAGNOSIS — Z01818 Encounter for other preprocedural examination: Secondary | ICD-10-CM

## 2018-02-05 DIAGNOSIS — I340 Nonrheumatic mitral (valve) insufficiency: Secondary | ICD-10-CM | POA: Diagnosis not present

## 2018-02-05 DIAGNOSIS — J9601 Acute respiratory failure with hypoxia: Secondary | ICD-10-CM | POA: Diagnosis not present

## 2018-02-05 DIAGNOSIS — I447 Left bundle-branch block, unspecified: Secondary | ICD-10-CM | POA: Diagnosis present

## 2018-02-05 DIAGNOSIS — Z9289 Personal history of other medical treatment: Secondary | ICD-10-CM

## 2018-02-05 DIAGNOSIS — N179 Acute kidney failure, unspecified: Secondary | ICD-10-CM | POA: Diagnosis not present

## 2018-02-05 DIAGNOSIS — D6959 Other secondary thrombocytopenia: Secondary | ICD-10-CM | POA: Diagnosis not present

## 2018-02-05 DIAGNOSIS — I5021 Acute systolic (congestive) heart failure: Secondary | ICD-10-CM | POA: Diagnosis present

## 2018-02-05 HISTORY — PX: RIGHT/LEFT HEART CATH AND CORONARY/GRAFT ANGIOGRAPHY: CATH118267

## 2018-02-05 HISTORY — DX: Nonrheumatic aortic (valve) stenosis: I35.0

## 2018-02-05 HISTORY — DX: Ischemic cardiomyopathy: I25.5

## 2018-02-05 HISTORY — DX: Atherosclerotic heart disease of native coronary artery without angina pectoris: I25.10

## 2018-02-05 LAB — BASIC METABOLIC PANEL
ANION GAP: 14 (ref 5–15)
BUN: 41 mg/dL — AB (ref 8–23)
CHLORIDE: 81 mmol/L — AB (ref 98–111)
CO2: 31 mmol/L (ref 22–32)
Calcium: 9.1 mg/dL (ref 8.9–10.3)
Creatinine, Ser: 1.9 mg/dL — ABNORMAL HIGH (ref 0.61–1.24)
GFR calc Af Amer: 38 mL/min — ABNORMAL LOW (ref >60)
GFR, EST NON AFRICAN AMERICAN: 32 mL/min — AB (ref >60)
Glucose, Bld: 134 mg/dL — ABNORMAL HIGH (ref 70–99)
POTASSIUM: 4.1 mmol/L (ref 3.5–5.1)
SODIUM: 126 mmol/L — AB (ref 135–145)

## 2018-02-05 LAB — TROPONIN I: Troponin I: 0.1 ng/mL (ref <0.03)

## 2018-02-05 SURGERY — RIGHT/LEFT HEART CATH AND CORONARY/GRAFT ANGIOGRAPHY
Anesthesia: Moderate Sedation

## 2018-02-05 MED ORDER — ACETAMINOPHEN 325 MG PO TABS: 650.0000 mg | ORAL_TABLET | ORAL | Status: DC | PRN

## 2018-02-05 MED ORDER — SODIUM CHLORIDE 0.9% FLUSH: 3.0000 mL | INTRAVENOUS | Status: DC | PRN

## 2018-02-05 MED ORDER — BUDESONIDE 0.25 MG/2ML IN SUSP
0.2500 mg | Freq: Two times a day (BID) | RESPIRATORY_TRACT | Status: DC
  Administered 2018-02-05 – 2018-02-13 (×14): 0.25 mg via RESPIRATORY_TRACT
  Filled 2018-02-05 (×16): qty 2

## 2018-02-05 MED ORDER — POLYVINYL ALCOHOL 1.4 % OP SOLN
2.0000 [drp] | OPHTHALMIC | Status: DC | PRN
  Filled 2018-02-05: qty 15

## 2018-02-05 MED ORDER — SODIUM CHLORIDE 0.9% FLUSH
3.0000 mL | Freq: Two times a day (BID) | INTRAVENOUS | Status: DC
  Administered 2018-02-05 – 2018-02-06 (×2): 3 mL via INTRAVENOUS

## 2018-02-05 MED ORDER — BISACODYL 10 MG RE SUPP: 10.0000 mg | Freq: Every day | RECTAL | Status: DC | PRN

## 2018-02-05 MED ORDER — MIDAZOLAM HCL 2 MG/2ML IJ SOLN
INTRAMUSCULAR | Status: DC | PRN
  Administered 2018-02-05: 0.5 mg via INTRAVENOUS

## 2018-02-05 MED ORDER — ONDANSETRON HCL 4 MG/2ML IJ SOLN: 4.0000 mg | Freq: Four times a day (QID) | INTRAMUSCULAR | Status: DC | PRN

## 2018-02-05 MED ORDER — ASPIRIN 300 MG RE SUPP: 300.0000 mg | RECTAL | Status: AC

## 2018-02-05 MED ORDER — ATORVASTATIN CALCIUM 80 MG PO TABS: 80.0000 mg | ORAL_TABLET | Freq: Every day | ORAL | 0 refills | Status: AC

## 2018-02-05 MED ORDER — GUAIFENESIN 100 MG/5ML PO SOLN
5.0000 mL | ORAL | Status: DC | PRN
  Filled 2018-02-05: qty 5

## 2018-02-05 MED ORDER — ASPIRIN 81 MG PO CHEW: 81.0000 mg | CHEWABLE_TABLET | Freq: Every day | ORAL | Status: DC

## 2018-02-05 MED ORDER — DOCUSATE SODIUM 100 MG PO CAPS
100.0000 mg | ORAL_CAPSULE | Freq: Two times a day (BID) | ORAL | Status: DC
  Administered 2018-02-05 – 2018-02-07 (×4): 100 mg via ORAL
  Filled 2018-02-05 (×4): qty 1

## 2018-02-05 MED ORDER — FENTANYL CITRATE (PF) 100 MCG/2ML IJ SOLN
INTRAMUSCULAR | Status: AC
  Filled 2018-02-05: qty 2

## 2018-02-05 MED ORDER — MIDAZOLAM HCL 2 MG/2ML IJ SOLN
INTRAMUSCULAR | Status: AC
  Filled 2018-02-05: qty 2

## 2018-02-05 MED ORDER — IPRATROPIUM-ALBUTEROL 0.5-2.5 (3) MG/3ML IN SOLN
3.0000 mL | Freq: Four times a day (QID) | RESPIRATORY_TRACT | Status: DC
  Administered 2018-02-05 – 2018-02-06 (×2): 3 mL via RESPIRATORY_TRACT
  Filled 2018-02-05 (×2): qty 3

## 2018-02-05 MED ORDER — ASPIRIN 81 MG PO CHEW
324.0000 mg | CHEWABLE_TABLET | ORAL | Status: AC
  Administered 2018-02-05: 324 mg via ORAL
  Filled 2018-02-05: qty 4

## 2018-02-05 MED ORDER — HEPARIN (PORCINE) IN NACL 1000-0.9 UT/500ML-% IV SOLN
INTRAVENOUS | Status: AC
  Filled 2018-02-05: qty 1000

## 2018-02-05 MED ORDER — ACETAMINOPHEN 325 MG PO TABS
650.0000 mg | ORAL_TABLET | ORAL | Status: DC | PRN
  Administered 2018-02-06 – 2018-02-13 (×8): 650 mg via ORAL
  Filled 2018-02-05 (×8): qty 2

## 2018-02-05 MED ORDER — TRAMADOL HCL 50 MG PO TABS
50.0000 mg | ORAL_TABLET | Freq: Four times a day (QID) | ORAL | Status: DC | PRN
  Administered 2018-02-06 – 2018-02-07 (×3): 50 mg via ORAL
  Filled 2018-02-05 (×3): qty 1

## 2018-02-05 MED ORDER — ASPIRIN EC 81 MG PO TBEC
81.0000 mg | DELAYED_RELEASE_TABLET | Freq: Every day | ORAL | Status: DC
  Administered 2018-02-06 – 2018-02-07 (×2): 81 mg via ORAL
  Filled 2018-02-05 (×2): qty 1

## 2018-02-05 MED ORDER — NICOTINE 21 MG/24HR TD PT24
21.0000 mg | MEDICATED_PATCH | Freq: Every day | TRANSDERMAL | Status: DC
  Administered 2018-02-06 – 2018-02-13 (×7): 21 mg via TRANSDERMAL
  Filled 2018-02-05 (×6): qty 1

## 2018-02-05 MED ORDER — ONDANSETRON HCL 4 MG/2ML IJ SOLN
4.0000 mg | Freq: Four times a day (QID) | INTRAMUSCULAR | Status: DC | PRN
  Administered 2018-02-12: 4 mg via INTRAVENOUS
  Filled 2018-02-05: qty 2

## 2018-02-05 MED ORDER — BISACODYL 5 MG PO TBEC: 5.0000 mg | DELAYED_RELEASE_TABLET | Freq: Every day | ORAL | Status: DC | PRN

## 2018-02-05 MED ORDER — ATORVASTATIN CALCIUM 80 MG PO TABS: 80.0000 mg | ORAL_TABLET | Freq: Every day | ORAL | 0 refills | Status: DC

## 2018-02-05 MED ORDER — ASPIRIN 81 MG PO CHEW: 81.0000 mg | CHEWABLE_TABLET | Freq: Every day | ORAL | 0 refills | Status: AC

## 2018-02-05 MED ORDER — GUAIFENESIN 100 MG/5ML PO SOLN
5.0000 mL | Freq: Three times a day (TID) | ORAL | Status: DC | PRN
  Administered 2018-02-05 (×2): 100 mg via ORAL
  Filled 2018-02-05 (×3): qty 5

## 2018-02-05 MED ORDER — SODIUM CHLORIDE 0.9% FLUSH: 3.0000 mL | Freq: Two times a day (BID) | INTRAVENOUS | Status: DC

## 2018-02-05 MED ORDER — GABAPENTIN 100 MG PO CAPS: 100.0000 mg | ORAL_CAPSULE | Freq: Three times a day (TID) | ORAL | Status: DC

## 2018-02-05 MED ORDER — SENNOSIDES-DOCUSATE SODIUM 8.6-50 MG PO TABS: 1.0000 | ORAL_TABLET | Freq: Every evening | ORAL | Status: DC | PRN

## 2018-02-05 MED ORDER — ENOXAPARIN SODIUM 40 MG/0.4ML ~~LOC~~ SOLN: 40.0000 mg | SUBCUTANEOUS | Status: DC

## 2018-02-05 MED ORDER — HEPARIN SODIUM (PORCINE) 1000 UNIT/ML IJ SOLN
INTRAMUSCULAR | Status: AC
  Filled 2018-02-05: qty 1

## 2018-02-05 MED ORDER — SODIUM CHLORIDE 0.9 % IV SOLN: 250.0000 mL | INTRAVENOUS | Status: DC | PRN

## 2018-02-05 MED ORDER — GABAPENTIN 100 MG PO CAPS
100.0000 mg | ORAL_CAPSULE | Freq: Three times a day (TID) | ORAL | Status: DC
  Administered 2018-02-05 – 2018-02-07 (×7): 100 mg via ORAL
  Filled 2018-02-05 (×7): qty 1

## 2018-02-05 MED ORDER — SODIUM CHLORIDE 0.9% FLUSH
3.0000 mL | Freq: Two times a day (BID) | INTRAVENOUS | Status: DC
  Administered 2018-02-05: 3 mL via INTRAVENOUS

## 2018-02-05 MED ORDER — FUROSEMIDE 10 MG/ML IJ SOLN: 40.0000 mg | Freq: Two times a day (BID) | INTRAMUSCULAR | Status: DC

## 2018-02-05 MED ORDER — GUAIFENESIN 100 MG/5ML PO SOLN
5.0000 mL | Freq: Three times a day (TID) | ORAL | Status: DC | PRN
  Administered 2018-02-06 – 2018-02-07 (×3): 100 mg via ORAL
  Filled 2018-02-05 (×3): qty 5

## 2018-02-05 MED ORDER — ATORVASTATIN CALCIUM 20 MG PO TABS
80.0000 mg | ORAL_TABLET | Freq: Every day | ORAL | Status: DC
  Filled 2018-02-05: qty 1

## 2018-02-05 MED ORDER — HEPARIN SODIUM (PORCINE) 1000 UNIT/ML IJ SOLN
INTRAMUSCULAR | Status: DC | PRN
  Administered 2018-02-05: 3500 [IU] via INTRAVENOUS

## 2018-02-05 MED ORDER — ALPRAZOLAM 0.5 MG PO TABS
0.5000 mg | ORAL_TABLET | Freq: Three times a day (TID) | ORAL | Status: DC | PRN
  Administered 2018-02-05 – 2018-02-07 (×4): 0.5 mg via ORAL
  Filled 2018-02-05 (×4): qty 1

## 2018-02-05 MED ORDER — TRAZODONE HCL 50 MG PO TABS
100.0000 mg | ORAL_TABLET | Freq: Every day | ORAL | Status: DC
  Administered 2018-02-05 – 2018-02-09 (×5): 100 mg via ORAL
  Filled 2018-02-05: qty 1
  Filled 2018-02-05: qty 2
  Filled 2018-02-05 (×2): qty 1
  Filled 2018-02-05: qty 2

## 2018-02-05 MED ORDER — FENTANYL CITRATE (PF) 100 MCG/2ML IJ SOLN
INTRAMUSCULAR | Status: DC | PRN
  Administered 2018-02-05: 25 ug via INTRAVENOUS

## 2018-02-05 MED ORDER — POLYVINYL ALCOHOL 1.4 % OP SOLN
2.0000 [drp] | OPHTHALMIC | Status: DC | PRN
  Administered 2018-02-05: 2 [drp] via OPHTHALMIC
  Filled 2018-02-05: qty 15

## 2018-02-05 MED ORDER — ASPIRIN EC 81 MG PO TBEC: 81.0000 mg | DELAYED_RELEASE_TABLET | Freq: Every day | ORAL | Status: DC

## 2018-02-05 MED ORDER — ASPIRIN 81 MG PO CHEW: 81.0000 mg | CHEWABLE_TABLET | Freq: Every day | ORAL | 0 refills | Status: DC

## 2018-02-05 MED ORDER — FUROSEMIDE 10 MG/ML IJ SOLN
40.0000 mg | Freq: Two times a day (BID) | INTRAMUSCULAR | Status: DC
  Administered 2018-02-06: 40 mg via INTRAVENOUS
  Filled 2018-02-05: qty 4

## 2018-02-05 MED ORDER — ONDANSETRON HCL 4 MG/5ML PO SOLN
4.0000 mg | Freq: Four times a day (QID) | ORAL | Status: DC | PRN
  Administered 2018-02-13: 4 mg via ORAL
  Filled 2018-02-05 (×2): qty 5

## 2018-02-05 MED ORDER — NITROGLYCERIN 0.4 MG SL SUBL: 0.4000 mg | SUBLINGUAL_TABLET | SUBLINGUAL | Status: DC | PRN

## 2018-02-05 MED ORDER — HYDROCODONE-ACETAMINOPHEN 5-325 MG PO TABS
1.0000 | ORAL_TABLET | Freq: Four times a day (QID) | ORAL | Status: DC | PRN
  Administered 2018-02-06 – 2018-02-07 (×3): 1 via ORAL
  Filled 2018-02-05 (×3): qty 1

## 2018-02-05 MED ORDER — VERAPAMIL HCL 2.5 MG/ML IV SOLN
INTRAVENOUS | Status: AC
  Filled 2018-02-05: qty 2

## 2018-02-05 MED ORDER — ATORVASTATIN CALCIUM 80 MG PO TABS
80.0000 mg | ORAL_TABLET | Freq: Every day | ORAL | Status: DC
  Administered 2018-02-06 – 2018-02-11 (×6): 80 mg via ORAL
  Filled 2018-02-05 (×6): qty 1

## 2018-02-05 SURGICAL SUPPLY — 13 items
CATH BALLN WEDGE 5F 110CM (CATHETERS) ×3 IMPLANT
CATH INFINITI 5 FR JL3.5 (CATHETERS) ×3 IMPLANT
CATH INFINITI 5FR JK (CATHETERS) ×3 IMPLANT
CATH INFINITI JR4 5F (CATHETERS) ×3 IMPLANT
CATH LAUNCHER 6FR EBU3.5 (CATHETERS) ×6 IMPLANT
DEVICE RAD TR BAND REGULAR (VASCULAR PRODUCTS) ×3 IMPLANT
GLIDESHEATH SLEND SS 6F .021 (SHEATH) ×3 IMPLANT
KIT MANI 3VAL PERCEP (MISCELLANEOUS) ×3 IMPLANT
KIT RIGHT HEART (MISCELLANEOUS) ×3 IMPLANT
PACK CARDIAC CATH (CUSTOM PROCEDURE TRAY) ×3 IMPLANT
SHEATH GLIDE SLENDER 4/5FR (SHEATH) ×3 IMPLANT
WIRE HITORQ VERSACORE ST 145CM (WIRE) ×3 IMPLANT
WIRE ROSEN-J .035X260CM (WIRE) ×3 IMPLANT

## 2018-02-05 NOTE — Discharge Summary (Signed)
[facility] [place] NAME: George Washington    MR#:  [id number]  DATE OF BIRTH:  September 02, 1940  DATE OF ADMISSION:  01/31/2018   ADMITTING PHYSICIAN: Henreitta Leber, MD  DATE OF DISCHARGE: 01/12/2018  PRIMARY CARE PHYSICIAN: Jodelle Green, FNP   ADMISSION DIAGNOSIS:  Heart Failure  DISCHARGE DIAGNOSIS:  Active Problems:   CHF (congestive heart failure) (HCC)   Reducible right inguinal hernia   Umbilical hernia without obstruction and without gangrene   Nonrheumatic aortic valve stenosis  SECONDARY DIAGNOSIS:   Past Medical History:  Diagnosis Date  . Aortic stenosis   . Arthritis   . CKD (chronic kidney disease), stage III ([place])   . HTN (hypertension)   . Iron deficiency anemia   . Mitral regurgitation   . Narcotic dependence, in remission ([place])   . Retinal vein occlusion   . Systolic heart failure ([place]) 01/2018  . Tobacco abuse    HOSPITAL COURSE:   George Washington is a 77 year old male who was directly admitted from the cardiologist office due to worsening lower extremity edema and shortness of breath.   Acute on chronic systolic congestive heart failure -Recent outpatient ECHO with EF 25-30% -Treated with IV Lasix -Cardiac cath performed 11/25 and showed significant two-vessel CAD with heavily calcified vessels and severe aortic stenosis -Cardiology recommended transfer to [facility] for evaluation of AVR plus two-vessel CABG versus TAVR -Patient remains volume overloaded and should continue to receive [facility] diuresis at [facility]  Hyponatremia- sodium remains low -Not much improvement with IV diuresis -Seen by nephrology who has given 3 doses of tolvaptan -Started on sodium bicarb infusion per nephrology -Fluid restriction  Severe aortic stenosis -Plan as above  Acute on chronic kidney disease stage III- improving.  Likely due to cardiorenal syndrome. -Meloxicam and HCTZ held  Tobacco use-currently quit smoking -Continue nicotine  patch  DISCHARGE CONDITIONS:  Acute on chronic systolic congestive heart failure Hyponatremia Severe aortic stenosis Acute on chronic kidney disease stage III Tobacco use CONSULTS OBTAINED:  Treatment Team:  Minna Merritts, MD Lavonia Dana, MD DRUG ALLERGIES:   Allergies  Allergen Reactions  . Lisinopril     cough   DISCHARGE MEDICATIONS:   Allergies as of 01/19/2018      Reactions   Lisinopril    cough      Medication List    STOP taking these medications   hydrochlorothiazide 50 MG tablet Commonly known as:  HYDRODIURIL   meloxicam 15 MG tablet Commonly known as:  MOBIC     TAKE these medications   ALPRAZolam 0.25 MG tablet Commonly known as:  XANAX Take 1 tablet (0.25 mg total) by mouth 2 (two) times daily as needed for anxiety.   aspirin 81 MG chewable tablet Chew 1 tablet (81 mg total) by mouth daily.   aspirin-acetaminophen-caffeine 250-250-65 MG tablet Commonly known as:  EXCEDRIN MIGRAINE Take 1 tablet by mouth every 6 (six) hours as needed for headache.   atorvastatin 80 MG tablet Commonly known as:  LIPITOR Take 1 tablet (80 mg total) by mouth daily at 6 PM.   CENTRUM SILVER PO Take 1 tablet by mouth daily.   furosemide 40 MG tablet Commonly known as:  LASIX Take 1 tablet (40 mg total) by mouth daily. What changed:  how much to take   gabapentin 100 MG capsule Commonly known as:  NEURONTIN Take 1 capsule (100 mg total) by mouth 3 (three) times daily.   traMADol 50  MG tablet Commonly known as:  ULTRAM Take 1 tablet (50 mg total) by mouth every 8 (eight) hours as needed.   traZODone 50 MG tablet Commonly known as:  DESYREL TAKE 0.5-1 TABLET EVERY NIGHT AT BEDTIME AS NEEDED FOR SLEEP        DISCHARGE INSTRUCTIONS:  Transferred to [employer] [facility] DIET:  Cardiac diet DISCHARGE CONDITION:  Stable OXYGEN:  Home Oxygen: No.  Oxygen Delivery: room air DISCHARGE LOCATION:  [facility]   If you experience worsening of  your admission symptoms, develop shortness of breath, life threatening emergency, suicidal or homicidal thoughts you must seek medical attention immediately by calling 911 or calling your MD immediately  if symptoms less severe.  You Must read complete instructions/literature along with all the possible adverse reactions/side effects for all the Medicines you take and that have been prescribed to you. Take any new Medicines after you have completely understood and accpet all the possible adverse reactions/side effects.   Please note  You were cared for by a hospitalist during your hospital stay. If you have any questions about your discharge medications or the care you received while you were in the hospital after you are discharged, you can call the unit and asked to speak with the hospitalist on call if the hospitalist that took care of you is not available. Once you are discharged, your primary care physician will handle any further medical issues. Please note that NO REFILLS for any discharge medications will be authorized once you are discharged, as it is imperative that you return to your primary care physician (or establish a relationship with a primary care physician if you do not have one) for your aftercare needs so that they can reassess your need for medications and monitor your lab values.    On the day of Discharge:  VITAL SIGNS:  Blood pressure 108/71, pulse 88, temperature 98.2 F (36.8 C), temperature source Oral, resp. rate (!) 26, height 5\' 9"  (1.753 m), weight 72.7 kg, SpO2 97 %. PHYSICAL EXAMINATION:  GENERAL:  77 y.o.-year-old patient lying in the bed with no acute distress.  EYES: Pupils equal, round, reactive to light and accommodation. No scleral icterus. Extraocular muscles intact.  HEENT: Head atraumatic, normocephalic. Oropharynx and nasopharynx clear.  NECK:  Supple, no jugular venous distention. No thyroid enlargement, no tenderness.  LUNGS: Normal breath sounds  bilaterally, no wheezing, rales,rhonchi or crepitation. No use of accessory muscles of respiration.  CARDIOVASCULAR: RRR, S1, S2 normal. No murmurs, rubs, or gallops.  ABDOMEN: Soft, non-tender, non-distended. Bowel sounds present. No organomegaly or mass.  EXTREMITIES: No cyanosis or clubbing. 2+ pitting edema bilaterally NEUROLOGIC: Cranial nerves II through XII are intact. Muscle strength 5/5 in all extremities. Sensation intact. Gait not checked.  PSYCHIATRIC: The patient is alert and oriented x 3.  SKIN: No obvious rash, lesion, or ulcer.  DATA REVIEW:   CBC Recent Labs  Lab 01/31/18 1908  WBC 5.1  HGB 10.4*  HCT 33.3*  PLT 202    Chemistries  Recent Labs  Lab 02/10/2018 0007  NA 126*  K 4.1  CL 81*  CO2 31  GLUCOSE 134*  BUN 41*  CREATININE 1.90*  CALCIUM 9.1     Microbiology Results  Results for orders placed or performed in visit on 09/27/12  Fecal occult blood, imunochemical (IFOB)     Status: None   Collection Time: 09/27/12 12:06 PM  Result Value Ref Range Status   Fecal Occult Bld Negative Negative Final  RADIOLOGY:  No results found.   Management plans discussed with the patient, family and they are in agreement.  CODE STATUS: Full Code   TOTAL TIME TAKING CARE OF THIS PATIENT: 35 minutes.    Berna Spare Sora Olivo M.D on 02/10/2018 at 2:38 PM  Between 7am to 6pm - Pager - [phone]  After 6pm go to [email or web address] - Proofreader  [employer] [place] [facility]  Office  [phone]  CC: Primary care physician; Jodelle Green, FNP   Note: This dictation was prepared with Dragon dictation along with smaller phrase technology. Any transcriptional errors that result from this process are unintentional.

## 2018-02-05 NOTE — Interval H&P Note (Signed)
History and Physical Interval Note:  01/14/2018 12:26 PM  Neal Dy.  has presented today for surgery, with the diagnosis of acute on chronic heart failure  The various methods of treatment have been discussed with the patient and family. After consideration of risks, benefits and other options for treatment, the patient has consented to  Procedure(s): RIGHT/LEFT HEART CATH AND CORONARY/GRAFT ANGIOGRAPHY (N/A) as a surgical intervention .  The patient's history has been reviewed, patient examined, no change in status, stable for surgery.  I have reviewed the patient's chart and labs.  Questions were answered to the patient's satisfaction.     Kathlyn [place]

## 2018-02-05 NOTE — Progress Notes (Signed)
Pt transfering to [facility] 05. Report given to [facility], Therapist, sports. Report given to [facility] RN. [facility] will transport pt via stretcher. Pt in no distress, VSS, Dr. Brett Albino made aware. Belonging sent with pt. Wife at bedside.

## 2018-02-05 NOTE — Progress Notes (Signed)
Progress Note  Patient Name: George Washington. Date of Encounter: 01/18/2018  Primary Cardiologist: Dr. Saunders Revel  Subjective   Patient is scheduled for R/L cardiac catheterization today at 11AM.  He is joined by his wife this morning. Both had a question regarding the wrap / medical equipment that he uses for his inguinal hernia. Given that it obstructs his femoral arteries, it was recommended he leave it in the room.   He is able to lay flat, having received some anti-anxiety medication earlier this AM. He reported that he is normally not able to lay flat at home 2/2 feeling of racing HR but can on 2 pillows and anxiety medicine.   On exam, he still has 2+ b/l LEE. HR 101, BP 113/76.  Net output -586.3. Wt 160.2lbs  160.3lbs.  Cr 1.90; K 4.1; Na 126.  Inpatient Medications    Scheduled Meds: . budesonide  0.25 mg Nebulization BID  . docusate sodium  100 mg Oral BID  . enoxaparin (LOVENOX) injection  40 mg Subcutaneous Q24H  . gabapentin  100 mg Oral TID  . ipratropium-albuterol  3 mL Nebulization Q6H  . multivitamin with minerals  1 tablet Oral Daily  . nicotine  21 mg Transdermal Daily  . sodium chloride flush  3 mL Intravenous Q12H  . sodium chloride flush  3 mL Intravenous Q12H  . traZODone  100 mg Oral QHS   Continuous Infusions: . sodium chloride    . sodium chloride    . sodium chloride 10 mL/hr at 01/19/2018 0653  .  sodium bicarbonate  infusion 1000 mL 75 mL/hr at 01/13/2018 0653   PRN Meds: sodium chloride, sodium chloride, acetaminophen, ALPRAZolam, bisacodyl, bisacodyl, guaiFENesin, HYDROcodone-acetaminophen, ondansetron (ZOFRAN) IV, polyethylene glycol, senna-docusate, sodium chloride flush, sodium chloride flush, traMADol   Vital Signs    Vitals:   02/04/18 2050 02/01/2018 0226 02/09/2018 0302 02/07/2018 0805  BP:   109/81 113/76  Pulse:   99 (!) 101  Resp:   19 18  Temp:   97.8 F (36.6 C) 98.2 F (36.8 C)  TempSrc:   Oral Oral  SpO2: 94% 92% 91% 92%  Weight:    72.7 kg   Height:        Intake/Output Summary (Last 24 hours) at 02/08/2018 0815 Last data filed at 01/30/2018 0700 Gross per 24 hour  Intake 1413.67 ml  Output 2000 ml  Net -586.33 ml   Filed Weights   02/03/18 0505 02/04/18 0526 01/29/2018 0302  Weight: 72.3 kg 72.7 kg 72.7 kg    Telemetry    NSR, tachycardic, PVCs with previous reported brief run of ventricular trigeminy, PVCs - Personally Reviewed  ECG    No new tracings- Personally Reviewed  Physical Exam   GEN: No acute distress.  Accompanied by wife Neck: No JVD visible at 90 degrees as waiting for anxiety medication to kick in Cardiac: RRR, no murmurs, rubs, or gallops.  Respiratory: Clear to auscultation bilaterally. GI: Soft, nontender, non-distended. Umbilical and inguinal hernias   MS: 2+ bilateral lower extremity edema; No deformity. Neuro:  Nonfocal  Psych: Normal affect   Labs    Chemistry Recent Labs  Lab 02/04/18 0410 02/04/18 0904 02/04/18 1603 02/09/2018 0007  NA 125* 124* 124* 126*  K 5.4* 5.5*  --  4.1  CL 85* 81*  --  81*  CO2 30 28  --  31  GLUCOSE 111* 161*  --  134*  BUN 42* 43*  --  41*  CREATININE  1.96* 2.05*  --  1.90*  CALCIUM 9.4 9.7  --  9.1  GFRNONAA 31* 30*  --  32*  GFRAA 36* 34*  --  38*  ANIONGAP 10 15  --  14     Hematology Recent Labs  Lab 01/31/18 1908  WBC 5.1  RBC 3.94*  HGB 10.4*  HCT 33.3*  MCV 84.5  MCH 26.4  MCHC 31.2  RDW 16.5*  PLT 202    Cardiac EnzymesNo results for input(s): TROPONINI in the last 168 hours. No results for input(s): TROPIPOC in the last 168 hours.   BNPNo results for input(s): BNP, PROBNP in the last 168 hours.   DDimer No results for input(s): DDIMER in the last 168 hours.   Radiology    No results found.  Cardiac Studies   Echo 01/25/2018: Study Conclusions - Left ventricle: The cavity size was moderately dilated. Systolic function was severely reduced. The estimated ejection fraction was in the range of 20% to  25%. Diffuse hypokinesis. Hypokinesis of the inferior myocardium. Hypokinesis of the inferolateral myocardium. Hypokinesis of the lateral myocardium. Features are consistent with a pseudonormal left ventricular filling pattern, with concomitant abnormal relaxation and increased filling pressure (grade 2 diastolic dysfunction). - Aortic valve: There was moderate stenosis by mean gradient and peak velocity. Possibly severe by estimated AVA, may be under-estimated secondary to severely reduced LV function. There was mild to moderate regurgitation. Peak velocity (S): 342 cm/s. Mean gradient (S): 27 mm Hg. Valve area (VTI): 0.52 cm^2. - Mitral valve: There was moderate regurgitation. Valve area by continuity equation (using LVOT flow): 1.47 cm^2. - Left atrium: The atrium was moderately to severely dilated. - Pulmonary arteries: Systolic pressure was moderately elevated. PA peak pressure: 57 mm Hg (S). - Inferior vena cava: The vessel was dilated. The respirophasic diameter changes were blunted (<50%), consistent with elevated central venous pressure. Impressions: - Left pleural effusion noted, 5.7 cm.  Patient Profile     77 y.o. male with h/o HFrEF (20-25%), hypertension, CKDIII, tobacco use, and arthritis with newly diagnosed heart failure who is being seen today for the evaluation of acute HFrEF.  Assessment & Plan    1. Acute Systolic HF - B/l LEE still present. No chest pain.  - Diuretics held in preparation for [facility] and given decreasing Na. (Na 126 today 11/25). Tolvaptan tab 30mg  given 61/60. - Once better compensated and BP allows, addition of low-dose beta-blocker could be considered. Do not recommend ACE inhibitor/ARB/spiro 2/2 AOCKDIII and low BP- continue to hold. Escalate evidence based heart failure as vitals and renal function allow. Daily weights. Strict I/O. I/O, weights recorded as above.  - Continue ASA. - R/LHC today 11/25 at 11AM. Able to  lay flat with anxiety medication. Further recommendations following catheterization.    2. Aortic Stenosis and Mitral Regurgitation  -On echocardiogram, both AS/MR are at least mild.  Degree of AS may be underestimated by low CO (low flow/low gradient AS). Suspicion up to this point has been that MR is functional and will hopefully improve with treatment of his heart failure. R/LHC today 11/25 with further recommendations following procedure.  - If R/LHC is able to exclude exclude significant CAD, dobutamine stress echo could be considered to assess myocardial reserve and potential worsening of transvalvular gradient if there is still uncertainty about the degree AS.    3. CKDIII - Previous labs showed stable creatinine.  Cr 1.90, K 4.1 - Sodium bicarb infusion started Sunday 11/24 in preparation for cardiac catheterization -  As above, no ACE/ARB/Spiro d/t renal insufficiency. - Daily BMET - Continue to monitor renal function and electrolytes in the setting of acute heart failure and cardiac cath  4. Nicotine use - On nicotine patch - Cessation advised  5. Remainder per IM   For questions or updates, please contact [place] Please consult [email or web address] for contact info under        Signed, Arvil Chaco, PA-C  01/20/2018, 8:15 AM

## 2018-02-05 NOTE — H&P (Signed)
Cardiology Admission History and Physical:   Patient ID: George Washington. MRN: [id number]; DOB: March 12, 1941   Admission date: (Not on file)  Primary Care Provider: Jodelle Green, FNP Primary Cardiologist: Dr. Saunders Revel Primary Electrophysiologist:  None   Chief Complaint:  Evaluation of AVR plus two-vessel CABG versus TAVR.   Patient Profile:   George Washington. is a 77 y.o. male with HFrEF (20-25%), hypertension, CKDIII, tobacco use, and arthritis with newly diagnosed heart failure and exacerbation who is being seen today for the evaluation of AVR plus two-vessel CABG versus TAVR following R/LHC by Dr. Fletcher Anon on 02/07/2018.  History of Present Illness:   George Washington is a 77 year old male with PMH as above.  01/25/2018: The patient presented a week ago for event monitor placement to assess for recently diagnosed tachycardia.  At that time, he was noted to be quite short of breath and was seen urgently at [facility] cardiology.  He reported progressive exertional dyspnea, weight gain, and lower extremity edema over the preceding 3 weeks.  11/14 TTE performed during that visit showed severely reduced LVEF with significant AS and MR. he was therefore started on furosemide 40 mg daily, which was subsequently decreased to 20 mg daily following BMP.  01/31/2018: At follow-up at [facility] cardiology, the patient reported feeling the same with improved swelling and weight decrease on furosemide 40 mg however this improvement had trailed off since cutting back to 20 mg daily.  He denies chest pain, but continued to have marketed shortness of breath with minimal exertion.  He also reported significant orthopnea, sleeping at about 30 degrees on a good night and frequently upright.  His leg edema persisted.  He had not experienced palpitations or lightheadedness, but his heart rate remained around 100 bpm.  He noted several episodes of indigestion after eating.  He also noted feeling quite anxious about his shortness of breath  and difficulty sleeping.  The patient continued to appear significantly volume overloaded with decompensated heart failure.  He had not improved significantly with outpatient diuresis and his blood pressure remains soft borderline low, precluding the addition of evidence-based heart failure therapy.  Given his NYHA class IV symptoms, it was recommended that he be directly admitted to [facility] telemetry for more aggressive diuresis and close monitoring of his renal function.  During admission at [facility], he continued to remain significantly volume overloaded with sodium downtrending during IV diuresis.  As sodium was still downtrending, further diuresis was held and patient given tolvaptan.  Given his decompensated heart failure and CKD, beta-blocker/ACE/ARB/Entresto/spironolactone were all held with plan to escalate evidence-based heart failure therapy as vitals and renal function allowed with plan to escalate evidence-based heart failure therapy as vitals and renal function allowed.  Of note, [facility] telemetry was significant for NSR, tachycardic rate, and PVCs with previous reported brief run of ventricular trigeminy.    On 11/25, he underwent right and left heart cardiac catheterization.  As copied below, this catheterization showed significant two-vessel coronary artery disease with heavily calcified vessels.  The RCA was occluded at ostium with reasonable R-L collaterals, LCx had 70% proximal stenosis, and LAD had mild nonobstructive disease.  It was noted he had severe aortic stenosis with mean gradient of 24.7 mmHg with a calcified valve area of 0.81.  The valve leaflets appeared heavily calcified on fluoroscopy with significant restricted opening.  He also had severe aortic root and proximal ascending aortic calcifications.  RHC showed moderately to severely elevated filling pressures, moderate pulmonary  hypertension, and mildly reduced cardiac output.  PCWP was 29 mmHg with PA pressure 55/30. CO was 4.42 with a  CI of 2.35.  It was also noted that there was difficulty filling the coronary arteries with angiography due to calcifications and aortic stenosis.  A 6 [place] guide could not be advanced beyond the elbow area due to suspected stenosis.  Recommendations were for the patient to resume IV diuresis tomorrow and be transferred to [facility] for evaluation of AVR plus two-vessel CABG versus TAVR.  The patient was ruled overall high risk due to his comorbidities with low EF and chronic underlying kidney disease.   Past Medical History:  Diagnosis Date  . Aortic stenosis   . Arthritis   . CKD (chronic kidney disease), stage III ([place])   . HTN (hypertension)   . Iron deficiency anemia   . Mitral regurgitation   . Narcotic dependence, in remission ([place])   . Retinal vein occlusion   . Systolic heart failure ([place]) 01/2018  . Tobacco abuse     Past Surgical History:  Procedure Laterality Date  . cyst chest  1994   left  . EYE SURGERY    . rectal fissure  1968  . vro       Medications Prior to Admission: Prior to Admission medications   Medication Sig Start Date End Date Taking? Authorizing Provider  ALPRAZolam (XANAX) 0.25 MG tablet Take 1 tablet (0.25 mg total) by mouth 2 (two) times daily as needed for anxiety. 01/24/18   Jodelle Green, FNP  aspirin-acetaminophen-caffeine (EXCEDRIN MIGRAINE) [phone] MG per tablet Take 1 tablet by mouth every 6 (six) hours as needed for headache.     [provider]  furosemide (LASIX) 40 MG tablet Take 1 tablet (40 mg total) by mouth daily. Patient taking differently: Take 20 mg by mouth daily.  01/25/18 04/25/18  Theora Gianotti, NP  gabapentin (NEURONTIN) 100 MG capsule Take 1 capsule (100 mg total) by mouth 3 (three) times daily. 11/06/17   Rosemarie Ax, MD  hydrochlorothiazide (HYDRODIURIL) 50 MG tablet Take 50 mg by mouth daily.    [provider]  meloxicam (MOBIC) 15 MG tablet TAKE ONE TABLET BY MOUTH DAILY Patient  taking differently: Take 15 mg by mouth daily.  12/07/17   Lucille Passy, MD  Multiple Vitamins-Minerals (CENTRUM SILVER PO) Take 1 tablet by mouth daily.    [provider]  traMADol (ULTRAM) 50 MG tablet Take 1 tablet (50 mg total) by mouth every 8 (eight) hours as needed. Patient not taking: Reported on 01/30/2018 09/27/17   Lucille Passy, MD  traZODone (DESYREL) 50 MG tablet TAKE 0.5-1 TABLET EVERY NIGHT AT BEDTIME AS NEEDED FOR SLEEP 12/12/17   Lucille Passy, MD     Allergies:    Allergies  Allergen Reactions  . Lisinopril     cough    Social History:   Social History   Socioeconomic History  . Marital status: Married    Spouse name: Not on file  . Number of children: 2  . Years of education: Not on file  . Highest education level: Not on file  Occupational History  . Occupation: Scientist, research (physical sciences): [employer]    Comment: RETIRED  Social Needs  . Financial resource strain: Not on file  . Food insecurity:    Worry: Not on file    Inability: Not on file  . Transportation needs:    Medical: Not on file  Non-medical: Not on file  Tobacco Use  . Smoking status: Current Every Day Smoker    Packs/day: 0.50    Years: 30.00    Pack years: 15.00    Types: Cigarettes  . Smokeless tobacco: Never Used  . Tobacco comment: pt refused materials  Substance and Sexual Activity  . Alcohol use: Yes    Alcohol/week: 0.0 standard drinks    Comment: beer or wine occassionally  . Drug use: No  . Sexual activity: Yes  Lifestyle  . Physical activity:    Days per week: Not on file    Minutes per session: Not on file  . Stress: Not on file  Relationships  . Social connections:    Talks on phone: Not on file    Gets together: Not on file    Attends religious service: Not on file    Active member of club or organization: Not on file    Attends meetings of clubs or organizations: Not on file    Relationship status: Not on file  . Intimate partner violence:      Fear of current or ex partner: Not on file    Emotionally abused: Not on file    Physically abused: Not on file    Forced sexual activity: Not on file  Other Topics Concern  . Not on file  Social History Narrative   Lives locally with wife Baker Janus Leever.   Does not routinely exercise.   Retired Software engineer (retail).   He does attend NA meetings.      Does have a living will- does not want prolonged life support if futile.    Family History:  The patient's family history includes Heart attack in his father; Heart disease in his mother; Heart disease (age of onset: 38) in his sister; High Cholesterol in his brother and sister; High blood pressure in his brother and sister; Hyperlipidemia in his father; Hypertension in his father; Stroke in his father. There is no history of Colon cancer or Stomach cancer.    ROS:  Please see the history of present illness.  No reported chest pain, palpitations, racing heart rate. Baseline orthopnea reported with 2 pilllows. Tachycardia when attempt to lay flat. LEE reported Anxiety reported.  All other ROS reviewed and negative.     Physical Exam/Data:  There were no vitals filed for this visit. No intake or output data in the 24 hours ending 01/25/2018 1417 There were no vitals filed for this visit. There is no height or weight on file to calculate BMI.  General:  No acute distress HEENT: normal. JVP 8-10cm Vascular: No carotid bruits Cardiac:  normal S1, S2; regular rhythm, regular to tachycardic rate; +7/8 systolic murmur Lungs:  clear to auscultation bilaterally, no wheezing, rhonchi or rales  Abd: soft, nontender, no hepatomegaly + inguinal and umbilical hernias Ext: 2+ bilateral lower extremity edema Musculoskeletal:  No deformities, BUE and BLE strength normal and equal Skin: warm and dry  Neuro:  no focal abnormalities noted Psych:  Normal affect    EKG:  No new tracings since 11/20  Relevant CV Studies:  02/02/2018 L/RHC  Ost RCA  to Prox RCA lesion is 100% stenosed.  Prox Cx lesion is 70% stenosed.  Mid Cx lesion is 30% stenosed. 1.  Significant two-vessel coronary artery disease with heavily calcified vessels.  The RCA is occluded at the ostium with reasonable left-to-right collaterals.  The left circumflex has 70% proximal stenosis.  The LAD has mild nonobstructive disease. 2.  Severe aortic stenosis with a mean gradient of 24.7 mmHg with a calculated valve area of 0.81.  The valve leaflets appear heavily calcified on fluoroscopy with significantly restricted opening. 3.  Severe aortic root and proximal ascending aorta calcifications. 4.  Right heart catheterization showed moderately to severely elevated filling pressures, moderate pulmonary hypertension and mildly reduced cardiac output.  Pulmonary capillary wedge pressure was 29 mmHg with PA pressure of 55/32 mmHg.  Cardiac output was 4.42 with a cardiac index of 2.35. 5.  Difficulty filling the coronary arteries with angiography due to calcifications and aortic stenosis.  A 6 [place] guide could not be advanced beyond the elbow area due to suspected stenosis. Recommendations: The patient continues to be volume overloaded.  Resume intravenous diuresis tomorrow. Transferred to [facility] for evaluation of AVR plus two-vessel CABG versus TAVR.  The patient is overall high risk due to his comorbidities, low EF and underlying chronic kidney disease. Diagnostic Diagram      01/25/18 TTE Study Conclusions - Left ventricle: The cavity size was moderately dilated. Systolic   function was severely reduced. The estimated ejection fraction   was in the range of 20% to 25%. Diffuse hypokinesis. Hypokinesis   of the inferior myocardium. Hypokinesis of the inferolateral   myocardium. Hypokinesis of the lateral myocardium. Features are   consistent with a pseudonormal left ventricular filling pattern,   with concomitant abnormal relaxation and increased filling   pressure  (grade 2 diastolic dysfunction). - Aortic valve: There was moderate stenosis by mean gradient and   peak velocity. Possibly severe by estimated AVA, may be   under-estimated secondary to severely reduced LV function. There   was mild to moderate regurgitation. Peak velocity (S): 342 cm/s.   Mean gradient (S): 27 mm Hg. Valve area (VTI): 0.52 cm^2. - Mitral valve: There was moderate regurgitation. Valve area by   continuity equation (using LVOT flow): 1.47 cm^2. - Left atrium: The atrium was moderately to severely dilated. - Pulmonary arteries: Systolic pressure was moderately elevated. PA   peak pressure: 57 mm Hg (S). - Inferior vena cava: The vessel was dilated. The respirophasic   diameter changes were blunted (< 50%), consistent with elevated   central venous pressure. Impressions: - Left pleural effusion noted, 5.7 cm.  Laboratory Data:  Chemistry Recent Labs  Lab 02/04/18 0904 02/04/18 1603 02/07/2018 0007  NA 124* 124* 126*  K 5.5*  --  4.1  CL 81*  --  81*  CO2 28  --  31  GLUCOSE 161*  --  134*  BUN 43*  --  41*  CREATININE 2.05*  --  1.90*  CALCIUM 9.7  --  9.1  GFRNONAA 30*  --  32*  GFRAA 34*  --  38*  ANIONGAP 15  --  14    No results for input(s): PROT, ALBUMIN, AST, ALT, ALKPHOS, BILITOT in the last 168 hours. Hematology Recent Labs  Lab 01/31/18 1908  WBC 5.1  RBC 3.94*  HGB 10.4*  HCT 33.3*  MCV 84.5  MCH 26.4  MCHC 31.2  RDW 16.5*  PLT 202   Cardiac EnzymesNo results for input(s): TROPONINI in the last 168 hours. No results for input(s): TROPIPOC in the last 168 hours.  BNPNo results for input(s): BNP, PROBNP in the last 168 hours.  DDimer No results for input(s): DDIMER in the last 168 hours.  Radiology/Studies:  No results found.  Assessment and Plan:   1. Acute systolic congestive heart failure (EF 20-25%) -Remains volume  overloaded with bilateral 2+ lower extremity edema still present. No chest pain. Patient reports baseline 2 pillow  orthopnea and anxiety.  -Echo as above - 11/25 R/LHC showed moderately to severely elevated filling pressures, moderate pulmonary hypertension, and mildly reduced cardiac output. PCWP 29 mmHg and PASP of 55/32 mmHg.   -Diuretics held prior to our/LHC given downtrending Na (11/25 Na 126).  Recommendation to restart IV diuretics tomorrow 02/06/18 as remains volume overloaded. - Do not recommend ACE/ARB/spironolactone 2/2 acute on CKDIII.  No beta-blocker due to soft BP.  Continue ASA. Could escalate heart failure medications pending transfer to [facility] in [place] for further evaluation for AVR and 2v CABG v TAVR and as vitals and renal function allow.  2.  Aortic stenosis and mitral regurgitation - 11/14 TTE, AS/MR mild but believed to be underestimated 2/2 low CO (low flow, low gradient AS). Suspicion at that time was MR was functional and would improve with diuresis - 11/25 R/LHC as above shows severe left ear with mean gradient of 24.7 mmHg with a calculated value of area 0.81.  Valve leaflets appear heavily calcified on fluoroscopy with significant restricted opening.  Severe aortic root and proximal ascending aortic calcifications - Transferred for evaluation of AVR and 2v CABG v TAVR - overall high risk d/t comorbid conditions, low EF, underlying CKD  3. 2v CAD - 11/25 cath as above, significant 2v CAD with heavily calcified vessels. RCA occluded at ostium with left to right collaterals, left circumflex 70% proximal stenosis, LAD with mild nonobstructive disease - Escalate medical management as vitals and renal function allow as above. - Further recommendations pending evaluation for AVR and CABG  4. CKDIII - Cr 1.90, K 4.1 - Sodium bicarb infusion started Saturday 11/24 in preparation for 11/25 cath - No ACE/ARB/Spiro d/t renal insufficiency as above - Daily BMET to monitor renal function, electrolytes    5. Nicotine Use - Nicotine patch, cessation advised    Severity  of Illness: The appropriate patient status for this patient is INPATIENT. Inpatient status is judged to be reasonable and necessary in order to provide the required intensity of service to ensure the patient's safety. The patient's presenting symptoms, physical exam findings, and initial radiographic and laboratory data in the context of their chronic comorbidities is felt to place them at high risk for further clinical deterioration. Furthermore, it is not anticipated that the patient will be medically stable for discharge from the hospital within 2 midnights of admission. The following factors support the patient status of inpatient.   " The patient's presenting symptoms include HFrEF NYHA class IV symptoms " The worrisome physical exam findings include 2+LEE, 2/6 systolic murmur / AS, volume overload with orthopnea in setting of CKDIII. " The initial radiographic and laboratory data are worrisome because of decompensated HF with reduced EF 20-25%, AS, 2v CAD, CKDIII. " The chronic co-morbidities include as above.   * I certify that at the point of admission it is my clinical judgment that the patient will require inpatient hospital care spanning beyond 2 midnights from the point of admission due to high intensity of service, high risk for further deterioration and high frequency of surveillance required.*    For questions or updates, please contact [place] Please consult [email or web address] for contact info under        Signed, Arvil Chaco, PA-C  02/02/2018 2:17 PM

## 2018-02-05 NOTE — Discharge Instructions (Signed)
It was so nice to meet you during this hospitalization! You are being transferred to [facility] for consideration of an aortic valve replacement.  -Dr. Brett Albino

## 2018-02-05 NOTE — Progress Notes (Signed)
Pt off the floor for cardiac cath.

## 2018-02-05 NOTE — Progress Notes (Signed)
Pt returns from cath, R radial site is clean, dry, and intact. VSS, pt in no distress, Awaiting on bed to transfer to [facility] for bypass.

## 2018-02-05 NOTE — Care Management Important Message (Signed)
Copy of Medicare IM left in patient's room (out for procedure). 

## 2018-02-06 ENCOUNTER — Encounter (HOSPITAL_COMMUNITY): Payer: Self-pay

## 2018-02-06 ENCOUNTER — Encounter (HOSPITAL_COMMUNITY): Payer: Medicare Other

## 2018-02-06 ENCOUNTER — Other Ambulatory Visit: Payer: Self-pay

## 2018-02-06 ENCOUNTER — Inpatient Hospital Stay (HOSPITAL_COMMUNITY): Payer: Medicare Other

## 2018-02-06 ENCOUNTER — Inpatient Hospital Stay: Payer: Self-pay

## 2018-02-06 DIAGNOSIS — N183 Chronic kidney disease, stage 3 (moderate): Secondary | ICD-10-CM

## 2018-02-06 DIAGNOSIS — I5023 Acute on chronic systolic (congestive) heart failure: Secondary | ICD-10-CM

## 2018-02-06 DIAGNOSIS — N179 Acute kidney failure, unspecified: Secondary | ICD-10-CM

## 2018-02-06 DIAGNOSIS — I2510 Atherosclerotic heart disease of native coronary artery without angina pectoris: Secondary | ICD-10-CM

## 2018-02-06 DIAGNOSIS — I350 Nonrheumatic aortic (valve) stenosis: Secondary | ICD-10-CM

## 2018-02-06 DIAGNOSIS — I340 Nonrheumatic mitral (valve) insufficiency: Secondary | ICD-10-CM

## 2018-02-06 LAB — BLOOD GAS, ARTERIAL
ACID-BASE EXCESS: 11.1 mmol/L — AB (ref 0.0–2.0)
BICARBONATE: 35.6 mmol/L — AB (ref 20.0–28.0)
Drawn by: 547381
FIO2: 21
O2 SAT: 90.5 %
PATIENT TEMPERATURE: 98.6
pCO2 arterial: 51 mmHg — ABNORMAL HIGH (ref 32.0–48.0)
pH, Arterial: 7.458 — ABNORMAL HIGH (ref 7.350–7.450)
pO2, Arterial: 61.2 mmHg — ABNORMAL LOW (ref 83.0–108.0)

## 2018-02-06 LAB — CBC WITH DIFFERENTIAL/PLATELET
ABS IMMATURE GRANULOCYTES: 0.01 10*3/uL (ref 0.00–0.07)
Basophils Absolute: 0 10*3/uL (ref 0.0–0.1)
Basophils Relative: 1 %
EOS PCT: 6 %
Eosinophils Absolute: 0.3 10*3/uL (ref 0.0–0.5)
HEMATOCRIT: 30.5 % — AB (ref 39.0–52.0)
HEMOGLOBIN: 9.4 g/dL — AB (ref 13.0–17.0)
Immature Granulocytes: 0 %
LYMPHS PCT: 15 %
Lymphs Abs: 0.7 10*3/uL (ref 0.7–4.0)
MCH: 25.5 pg — AB (ref 26.0–34.0)
MCHC: 30.8 g/dL (ref 30.0–36.0)
MCV: 82.7 fL (ref 80.0–100.0)
MONO ABS: 0.6 10*3/uL (ref 0.1–1.0)
MONOS PCT: 13 %
NEUTROS ABS: 3.2 10*3/uL (ref 1.7–7.7)
Neutrophils Relative %: 65 %
Platelets: 209 10*3/uL (ref 150–400)
RBC: 3.69 MIL/uL — ABNORMAL LOW (ref 4.22–5.81)
RDW: 16 % — ABNORMAL HIGH (ref 11.5–15.5)
WBC: 4.9 10*3/uL (ref 4.0–10.5)
nRBC: 0 % (ref 0.0–0.2)

## 2018-02-06 LAB — PREALBUMIN: Prealbumin: 18.6 mg/dL (ref 18–38)

## 2018-02-06 LAB — COMPREHENSIVE METABOLIC PANEL
ALBUMIN: 3.5 g/dL (ref 3.5–5.0)
ALT: 38 U/L (ref 0–44)
ANION GAP: 14 (ref 5–15)
AST: 39 U/L (ref 15–41)
Alkaline Phosphatase: 63 U/L (ref 38–126)
BILIRUBIN TOTAL: 1 mg/dL (ref 0.3–1.2)
BUN: 39 mg/dL — ABNORMAL HIGH (ref 8–23)
CO2: 31 mmol/L (ref 22–32)
Calcium: 9.5 mg/dL (ref 8.9–10.3)
Chloride: 83 mmol/L — ABNORMAL LOW (ref 98–111)
Creatinine, Ser: 2.07 mg/dL — ABNORMAL HIGH (ref 0.61–1.24)
GFR calc Af Amer: 34 mL/min — ABNORMAL LOW (ref >60)
GFR calc non Af Amer: 29 mL/min — ABNORMAL LOW (ref >60)
GLUCOSE: 129 mg/dL — AB (ref 70–99)
POTASSIUM: 4 mmol/L (ref 3.5–5.1)
SODIUM: 128 mmol/L — AB (ref 135–145)
TOTAL PROTEIN: 6.8 g/dL (ref 6.5–8.1)

## 2018-02-06 LAB — URINALYSIS, COMPLETE (UACMP) WITH MICROSCOPIC
Bacteria, UA: NONE SEEN
Bilirubin Urine: NEGATIVE
Glucose, UA: NEGATIVE mg/dL
Hgb urine dipstick: NEGATIVE
Ketones, ur: NEGATIVE mg/dL
LEUKOCYTES UA: NEGATIVE
Nitrite: NEGATIVE
PH: 7 (ref 5.0–8.0)
Protein, ur: NEGATIVE mg/dL
SPECIFIC GRAVITY, URINE: 1.006 (ref 1.005–1.030)

## 2018-02-06 LAB — HEMOGLOBIN A1C
Hgb A1c MFr Bld: 5.8 % — ABNORMAL HIGH (ref 4.8–5.6)
MEAN PLASMA GLUCOSE: 119.76 mg/dL

## 2018-02-06 LAB — TROPONIN I
TROPONIN I: 0.08 ng/mL — AB (ref <0.03)
Troponin I: 0.09 ng/mL (ref <0.03)

## 2018-02-06 LAB — BRAIN NATRIURETIC PEPTIDE

## 2018-02-06 MED ORDER — SODIUM CHLORIDE 0.9 % IV SOLN
510.0000 mg | INTRAVENOUS | Status: AC
  Administered 2018-02-06 – 2018-02-13 (×2): 510 mg via INTRAVENOUS
  Filled 2018-02-06 (×2): qty 17

## 2018-02-06 MED ORDER — FUROSEMIDE 10 MG/ML IJ SOLN
80.0000 mg | Freq: Two times a day (BID) | INTRAMUSCULAR | Status: DC
  Administered 2018-02-06 (×2): 80 mg via INTRAVENOUS
  Filled 2018-02-06 (×2): qty 8

## 2018-02-06 MED ORDER — MILRINONE LACTATE IN DEXTROSE 20-5 MG/100ML-% IV SOLN
0.2500 ug/kg/min | INTRAVENOUS | Status: DC
  Administered 2018-02-06: 0.125 ug/kg/min via INTRAVENOUS
  Administered 2018-02-07: 0.25 ug/kg/min via INTRAVENOUS
  Filled 2018-02-06 (×2): qty 100

## 2018-02-06 MED ORDER — ENOXAPARIN SODIUM 30 MG/0.3ML ~~LOC~~ SOLN
30.0000 mg | SUBCUTANEOUS | Status: DC
  Administered 2018-02-06 – 2018-02-07 (×2): 30 mg via SUBCUTANEOUS
  Filled 2018-02-06 (×2): qty 0.3

## 2018-02-06 MED ORDER — SODIUM CHLORIDE 0.9% FLUSH: 10.0000 mL | INTRAVENOUS | Status: DC | PRN

## 2018-02-06 MED ORDER — SODIUM CHLORIDE 0.9% FLUSH
10.0000 mL | Freq: Two times a day (BID) | INTRAVENOUS | Status: DC
  Administered 2018-02-06 – 2018-02-13 (×11): 10 mL

## 2018-02-06 MED ORDER — IPRATROPIUM-ALBUTEROL 0.5-2.5 (3) MG/3ML IN SOLN
3.0000 mL | Freq: Three times a day (TID) | RESPIRATORY_TRACT | Status: DC
  Administered 2018-02-06: 3 mL via RESPIRATORY_TRACT
  Filled 2018-02-06: qty 3

## 2018-02-06 MED ORDER — IPRATROPIUM-ALBUTEROL 0.5-2.5 (3) MG/3ML IN SOLN: 3.0000 mL | Freq: Four times a day (QID) | RESPIRATORY_TRACT | Status: DC | PRN

## 2018-02-06 NOTE — Consult Note (Signed)
[address]       [place] [address]             [phone]          CARDIOTHORACIC SURGERY CONSULTATION REPORT  PCP is Guse, Jacquelynn Cree, FNP  Primary Cardiologist is End, Harrell Gave, MD Referring Provider is Wellington Hampshire, MD  Reason for consultation:  Severe aortic stenosis, mitral regurgitation, and CAD  HPI:  Patient is a 77 year old male with history of hypertension, stage III chronic kidney disease, and long-standing tobacco abuse who recently presented with likely acute on chronic combined systolic and diastolic congestive heart failure and was transferred to [facility] for surgical consultation and further management of severe aortic stenosis, mitral regurgitation, and multivessel coronary artery disease.  The patient denies any previous history of heart disease but admits that in retrospect he has had long-standing symptoms of exertional shortness of breath dating back at least 2 or 3 years.  He previously had attributed his symptoms of shortness of breath to long-standing tobacco abuse and likely COPD.  In mid October the patient began to experience orthostatic lightheadedness and worsening shortness of breath.  He was seen by his primary care physician and diuretic therapy was discontinued.  Low blood pressure and tachycardia progressed and the patient developed worsening exertional shortness of breath, lower extremity edema, orthopnea, and early satiety.  He gained more than 12 pounds in weight and a chest x-ray was performed demonstrating evidence of congestive heart failure with interstitial edema and bibasilar pleural effusions.  He was referred to the cardiology clinic for ZIO monitor and seen as an unscheduled visit by Ignacia Bayley on January 25, 2018.  He was noted to have a prominent systolic murmur on physical exam and EKG revealed left bundle branch block with lateral T wave inversion.  A transthoracic echocardiogram was performed  demonstrating the presence of global left ventricular systolic dysfunction with ejection fraction estimated 20-25%, severe aortic stenosis, and severe mitral regurgitation.  The patient was hospitalized January 31, 2018 and started on intravenous diuretic therapy.  He underwent left and right heart catheterization on February 05, 2018.  He was found to have severe two-vessel coronary artery disease with 100% chronic occlusion of the right coronary artery with left-to-right collaterals.  There was 70% discrete proximal stenosis of the left circumflex coronary artery and mild nonobstructive disease in the left anterior descending coronary territory.  Mean transvalvular gradient across the aortic valve was measured 24.7 mmHg with aortic valve area calculated 0.81 cm.  There was severe calcifications in the aortic root and proximal ascending aorta.  Right heart catheterization revealed moderate to severe pulmonary hypertension with PA pressures measured 55/32 and pulmonary capillary wedge pressure 29.  Cardiac output was 4.42 with cardiac index of 2.35.  The patient was transferred to [facility] for surgical consultation and further management.  Patient is married and lives locally in [place] with his wife.  He has been retired for several years having previously worked in [employer].  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  He does not exercise on a regular basis.  He admits that in retrospect he has been slowing down for the past 2 or 3 years.  He states that he gets short of breath and tired with low-level activity.  He has cut back his physical activity considerably recently.  He gets short of breath with exertion.  He has  had some orthopnea but this is currently improved.  He has lower extremity edema.  He denies PND.  He has not had palpitations or syncope.  His mobility is limited only by shortness of breath and fatigue.  His appetite is been poor for the last  couple of months although he has gained weight in association with worsening edema.  He reports a chronic intermittent productive cough.  He denies hemoptysis.  Past Medical History:  Diagnosis Date  . Arthritis   . CAD (coronary artery disease)   . CKD (chronic kidney disease), stage III ([place])   . HTN (hypertension)   . Iron deficiency anemia   . Ischemic cardiomyopathy   . Mitral regurgitation   . Narcotic dependence, in remission ([place])   . Retinal vein occlusion   . Severe aortic stenosis   . Systolic heart failure ([place]) 01/2018  . Tobacco abuse     Past Surgical History:  Procedure Laterality Date  . cyst chest  1994   left  . EYE SURGERY    . rectal fissure  1968  . RIGHT/LEFT HEART CATH AND CORONARY/GRAFT ANGIOGRAPHY N/A 01/31/2018   Procedure: RIGHT/LEFT HEART CATH AND CORONARY/GRAFT ANGIOGRAPHY;  Surgeon: Wellington Hampshire, MD;  Location: [facility];  Service: Cardiovascular;  Laterality: N/A;  . vro      Family History  Problem Relation Age of Onset  . Hyperlipidemia Father   . Hypertension Father   . Heart attack Father   . Stroke Father        died @ 6  . Heart disease Mother        died @ 62  . High Cholesterol Brother   . High blood pressure Brother   . Heart disease Sister 22       MI, CABG  . High Cholesterol Sister   . High blood pressure Sister   . Colon cancer Neg Hx   . Stomach cancer Neg Hx     Social History   Socioeconomic History  . Marital status: Married    Spouse name: Not on file  . Number of children: 2  . Years of education: Not on file  . Highest education level: Not on file  Occupational History  . Occupation: Scientist, research (physical sciences): [employer]    Comment: RETIRED  Social Needs  . Financial resource strain: Not on file  . Food insecurity:    Worry: Not on file    Inability: Not on file  . Transportation needs:    Medical: Not on file    Non-medical: Not on file  Tobacco Use  . Smoking status:  Current Every Day Smoker    Packs/day: 0.50    Years: 30.00    Pack years: 15.00    Types: Cigarettes  . Smokeless tobacco: Never Used  . Tobacco comment: pt refused materials  Substance and Sexual Activity  . Alcohol use: Yes    Alcohol/week: 0.0 standard drinks    Comment: beer or wine occassionally  . Drug use: No  . Sexual activity: Yes  Lifestyle  . Physical activity:    Days per week: Not on file    Minutes per session: Not on file  . Stress: Not on file  Relationships  . Social connections:    Talks on phone: More than three times a week    Gets together: More than three times a week    Attends religious service: More than 4 times per year  Active member of club or organization: Yes    Attends meetings of clubs or organizations: More than 4 times per year    Relationship status: Married  . Intimate partner violence:    Fear of current or ex partner: No    Emotionally abused: No    Physically abused: No    Forced sexual activity: No  Other Topics Concern  . Not on file  Social History Narrative   Lives locally with wife Baker Janus [place].   Does not routinely exercise.   Retired Software engineer (retail).   He does attend NA meetings.      Does have a living will- does not want prolonged life support if futile.    Prior to Admission medications   Medication Sig Start Date End Date Taking? Authorizing Provider  ALPRAZolam (XANAX) 0.25 MG tablet Take 1 tablet (0.25 mg total) by mouth 2 (two) times daily as needed for anxiety. 01/24/18  Yes Guse, Jacquelynn Cree, FNP  aspirin 81 MG chewable tablet Chew 1 tablet (81 mg total) by mouth daily. 01/29/2018  Yes Mayo, Pete Pelt, MD  aspirin-acetaminophen-caffeine [facility] MIGRAINE) [id number] MG per tablet Take 1 tablet by mouth every 6 (six) hours as needed for headache.    Yes [provider]  atorvastatin (LIPITOR) 80 MG tablet Take 1 tablet (80 mg total) by mouth daily at 6 PM. 01/30/2018  Yes Mayo, Pete Pelt, MD  furosemide  (LASIX) 40 MG tablet Take 1 tablet (40 mg total) by mouth daily. Patient taking differently: Take 20 mg by mouth daily.  01/25/18 04/25/18 Yes Theora Gianotti, NP  gabapentin (NEURONTIN) 100 MG capsule Take 1 capsule (100 mg total) by mouth 3 (three) times daily. 11/06/17  Yes Rosemarie Ax, MD  Multiple Vitamins-Minerals (CENTRUM SILVER PO) Take 1 tablet by mouth daily.   Yes [provider]  traMADol (ULTRAM) 50 MG tablet Take 1 tablet (50 mg total) by mouth every 8 (eight) hours as needed. 09/27/17  Yes Lucille Passy, MD  traZODone (DESYREL) 50 MG tablet TAKE 0.5-1 TABLET EVERY NIGHT AT BEDTIME AS NEEDED FOR SLEEP 12/12/17  Yes Lucille Passy, MD    Current Facility-Administered Medications  Medication Dose Route Frequency Provider Last Rate Last Dose  . acetaminophen (TYLENOL) tablet 650 mg  650 mg Oral Q4H PRN Marrianne Mood D, PA-C   650 mg at 02/06/18 1020  . ALPRAZolam Duanne Moron) tablet 0.5 mg  0.5 mg Oral TID PRN Marrianne Mood D, PA-C   0.5 mg at 02/10/2018 2110  . aspirin EC tablet 81 mg  81 mg Oral Daily Marrianne Mood D, PA-C   81 mg at 02/06/18 1019  . atorvastatin (LIPITOR) tablet 80 mg  80 mg Oral q1800 Visser, Jacquelyn D, PA-C      . bisacodyl (DULCOLAX) EC tablet 5 mg  5 mg Oral Daily PRN Mickle Plumb, Jacquelyn D, PA-C      . bisacodyl (DULCOLAX) suppository 10 mg  10 mg Rectal Daily PRN Mickle Plumb, Jacquelyn D, PA-C      . budesonide (PULMICORT) nebulizer solution 0.25 mg  0.25 mg Nebulization BID Mickle Plumb, Jacquelyn D, PA-C   0.25 mg at 02/06/18 0853  . docusate sodium (COLACE) capsule 100 mg  100 mg Oral BID Mickle Plumb, Jacquelyn D, PA-C   100 mg at 02/06/18 1019  . enoxaparin (LOVENOX) injection 30 mg  30 mg Subcutaneous Q24H Minus Breeding, MD   30 mg at 02/06/18 1017  . ferumoxytol (FERAHEME) 510 mg in sodium chloride 0.9 %  100 mL IVPB  510 mg Intravenous Weekly Clegg, Amy D, NP      . furosemide (LASIX) injection 80 mg  80 mg Intravenous BID Clegg, Amy D, NP   80  mg at 02/06/18 1358  . gabapentin (NEURONTIN) capsule 100 mg  100 mg Oral TID Marrianne Mood D, PA-C   100 mg at 02/06/18 1019  . guaiFENesin (ROBITUSSIN) 100 MG/5ML solution 100 mg  5 mL Oral Q8H PRN Marrianne Mood D, PA-C   100 mg at 02/06/18 0215  . HYDROcodone-acetaminophen (NORCO/VICODIN) 5-325 MG per tablet 1 tablet  1 tablet Oral Q6H PRN Marrianne Mood D, PA-C   1 tablet at 02/06/18 1425  . ipratropium-albuterol (DUONEB) 0.5-2.5 (3) MG/3ML nebulizer solution 3 mL  3 mL Nebulization Q6H PRN Minus Breeding, MD      . milrinone (PRIMACOR) 20 MG/100 ML (0.2 mg/mL) infusion  0.125 mcg/kg/min Intravenous Continuous Clegg, Amy D, NP 2.67 mL/hr at 02/06/18 1418 0.125 mcg/kg/min at 02/06/18 1418  . nicotine (NICODERM CQ - dosed in mg/24 hours) patch 21 mg  21 mg Transdermal Daily Mickle Plumb, Jacquelyn D, PA-C   21 mg at 02/06/18 1019  . nitroGLYCERIN (NITROSTAT) SL tablet 0.4 mg  0.4 mg Sublingual Q5 Min x 3 PRN Visser, Jacquelyn D, PA-C      . ondansetron (ZOFRAN) 4 MG/5ML solution 4 mg  4 mg Oral Q6H PRN Mickle Plumb, Jacquelyn D, PA-C      . ondansetron (ZOFRAN) injection 4 mg  4 mg Intravenous Q6H PRN Mickle Plumb, Jacquelyn D, PA-C      . polyvinyl alcohol (LIQUIFILM TEARS) 1.4 % ophthalmic solution 2 drop  2 drop Both Eyes PRN Mickle Plumb, Jacquelyn D, PA-C      . senna-docusate (Senokot-S) tablet 1 tablet  1 tablet Oral QHS PRN Marrianne Mood D, PA-C      . sodium chloride flush (NS) 0.9 % injection 10-40 mL  10-40 mL Intracatheter Q12H Minus Breeding, MD   10 mL at 02/06/18 1200  . sodium chloride flush (NS) 0.9 % injection 10-40 mL  10-40 mL Intracatheter PRN Minus Breeding, MD      . traMADol Veatrice Bourbon) tablet 50 mg  50 mg Oral Q6H PRN Marrianne Mood D, PA-C      . traZODone (DESYREL) tablet 100 mg  100 mg Oral QHS Visser, Jacquelyn D, PA-C   100 mg at 01/12/2018 2111    Allergies  Allergen Reactions  . Lisinopril     cough      Review of Systems:   General:  decreased appetite,  decreased energy, + weight gain, no weight loss, no fever  Cardiac:  no chest pain with exertion, no chest pain at rest, + SOB with exertion, no resting SOB, no PND, + orthopnea, no palpitations, no arrhythmia, no atrial fibrillation, + LE edema, + dizzy spells, no syncope  Respiratory:  + shortness of breath, no home oxygen, + productive cough, no dry cough, + bronchitis, no wheezing, no hemoptysis, no asthma, no pain with inspiration or cough, no sleep apnea, no CPAP at night  GI:   no difficulty swallowing, no reflux, no frequent heartburn, no hiatal hernia, no abdominal pain, no constipation, no diarrhea, no hematochezia, no hematemesis, no melena  GU:   no dysuria,  no frequency, no urinary tract infection, no hematuria, no enlarged prostate, no kidney stones, + kidney disease  Vascular:  no pain suggestive of claudication, no pain in feet, no leg cramps, no varicose veins, no DVT, no non-healing foot ulcer  Neuro:   no stroke, no TIA's, no seizures, no headaches, no temporary blindness one eye,  no slurred speech, no peripheral neuropathy, no chronic pain, no instability of gait, no memory/cognitive dysfunction  Musculoskeletal: no arthritis, no joint swelling, no myalgias, no difficulty walking, normal mobility   Skin:   no rash, no itching, no skin infections, no pressure sores or ulcerations  Psych:   no anxiety, no depression, no nervousness, no unusual recent stress  Eyes:   no blurry vision, no floaters, no recent vision changes, + wears glasses or contacts  ENT:   no hearing loss, no loose or painful teeth, no dentures, last saw dentist within the past year  Hematologic:  no easy bruising, no abnormal bleeding, no clotting disorder, no frequent epistaxis  Endocrine:  no diabetes, does not check CBG's at home     Physical Exam:   BP 101/71 (BP Location: Right Arm)   Pulse 98   Temp 98.5 F (36.9 C) (Oral)   Ht 5\' 6"  (1.676 m)   Wt 71.3 kg   SpO2 91%   BMI 25.36 kg/m    General:  Thin male NAD  HEENT:  Unremarkable   Neck:   + JVD, no bruits, no adenopathy   Chest:   Scattered inspiratory crackles, symmetrical breath sounds, no wheezes, no rhonchi   CV:   RRR, grade III/VI systolic murmur   Abdomen:  soft, non-tender, no masses   Extremities:  warm, well-perfused, pulses diminished but palpable, + bilateral lower extremity edema  Rectal/GU  Deferred  Neuro:   Grossly non-focal and symmetrical throughout  Skin:   Clean and dry, no rashes, no breakdown  Diagnostic Tests:  Lab Results: Recent Labs    02/06/18 0636  WBC 4.9  HGB 9.4*  HCT 30.5*  PLT 209   BMET:  Recent Labs    01/21/2018 0007 02/06/18 0126  NA 126* 128*  K 4.1 4.0  CL 81* 83*  CO2 31 31  GLUCOSE 134* 129*  BUN 41* 39*  CREATININE 1.90* 2.07*  CALCIUM 9.1 9.5    CBG (last 3)  No results for input(s): GLUCAP in the last 72 hours. PT/INR:  No results for input(s): LABPROT, INR in the last 72 hours.  CXR:  CHEST - 2 VIEW  COMPARISON:  01/23/2018  FINDINGS: Cardiac shadow remains enlarged. The degree of vascular congestion has improved slightly in the interval. Bilateral pleural effusions with underlying atelectatic changes are again seen but improved on the right. No bony abnormality is seen.  IMPRESSION: Improved effusion on the right although bilateral small effusions remain. Underlying atelectatic changes are noted.   Electronically Signed   By: Inez Catalina M.D.   On: 02/06/2018 08:51   Transthoracic Echocardiography  Patient:    Darriel, Utter MR #:       [id number] Study Date: 01/25/2018 Gender:     M Age:        40 Height:     175.3 cm Weight:     72.8 kg BSA:        1.89 m^2 Pt. Status: Room:   ATTENDING    Murray Hodgkins, MD  ORDERING     Murray Hodgkins, MD  [place], MD  PERFORMING   [place], [place]  SONOGRAPHER  Pilar Jarvis, RVT, RDCS,  RDMS  cc:  ------------------------------------------------------------------- LV EF: 20% -   25%  ------------------------------------------------------------------- Indications:      Dyspnea (R06.00).  ------------------------------------------------------------------- History:   PMH:  Onset of dyspnea on exertion 3 weeks ago. Tachycardia.  Murmur.  Risk factors:  Current tobacco use.  ------------------------------------------------------------------- Study Conclusions  - Left ventricle: The cavity size was moderately dilated. Systolic   function was severely reduced. The estimated ejection fraction   was in the range of 20% to 25%. Diffuse hypokinesis. Hypokinesis   of the inferior myocardium. Hypokinesis of the inferolateral   myocardium. Hypokinesis of the lateral myocardium. Features are   consistent with a pseudonormal left ventricular filling pattern,   with concomitant abnormal relaxation and increased filling   pressure (grade 2 diastolic dysfunction). - Aortic valve: There was moderate stenosis by mean gradient and   peak velocity. Possibly severe by estimated AVA, may be   under-estimated secondary to severely reduced LV function. There   was mild to moderate regurgitation. Peak velocity (S): 342 cm/s.   Mean gradient (S): 27 mm Hg. Valve area (VTI): 0.52 cm^2. - Mitral valve: There was moderate regurgitation. Valve area by   continuity equation (using LVOT flow): 1.47 cm^2. - Left atrium: The atrium was moderately to severely dilated. - Pulmonary arteries: Systolic pressure was moderately elevated. PA   peak pressure: 57 mm Hg (S). - Inferior vena cava: The vessel was dilated. The respirophasic   diameter changes were blunted (< 50%), consistent with elevated   central venous pressure.  Impressions:  - Left pleural effusion noted, 5.7 cm.  ------------------------------------------------------------------- Study data:  No prior study was available  for comparison.  Study status:  Routine.  Procedure:  Transthoracic echocardiography. Image quality was good.  Study completion:  There were no complications.          Transthoracic echocardiography.  M-mode, complete 2D, spectral Doppler, and color Doppler.  Birthdate: Patient birthdate: 1940-03-17.  Age:  Patient is 77 yr old.  Sex: Gender: male.    BMI: 23.7 kg/m^2.  Blood pressure:     116/68 Patient status:  Outpatient.  Study date:  Study date: 01/25/2018. Study time: 04:18 PM.  -------------------------------------------------------------------  ------------------------------------------------------------------- Left ventricle:  The cavity size was moderately dilated. Systolic function was severely reduced. The estimated ejection fraction was in the range of 20% to 25%. Diffuse hypokinesis.  Regional wall motion abnormalities:   Hypokinesis of the inferior myocardium. Hypokinesis of the inferolateral myocardium.  Hypokinesis of the lateral myocardium. Features are consistent with a pseudonormal left ventricular filling pattern, with concomitant abnormal relaxation and increased filling pressure (grade 2 diastolic dysfunction).  ------------------------------------------------------------------- Aortic valve:  Poorly visualized.  Severely calcified leaflets. Cusp separation was normal.  Doppler:  There was moderate stenosis by mean gradient and peak velocity. Possibly severe by estimated AVA, may be under-estimated secondary to severely reduced LV function. There was mild to moderate regurgitation.    VTI ratio of LVOT to aortic valve: 0.18. Valve area (VTI): 0.52 cm^2. Indexed valve area (VTI): 0.28 cm^2/m^2. Mean velocity ratio of LVOT to aortic valve: 0.22. Valve area (Vmean): 0.64 cm^2. Indexed valve area (Vmean): 0.34 cm^2/m^2.    Mean gradient (S): 27 mm Hg. Peak gradient (S): 47 mm Hg.  ------------------------------------------------------------------- Mitral  valve:   Structurally normal valve.   Leaflet separation was normal.  Doppler:  Transvalvular velocity was within the normal range. There was no evidence for stenosis. There was moderate regurgitation.    Valve area by pressure half-time: 5.12 cm^2. Indexed valve area by pressure half-time: 2.71 cm^2/m^2. Valve area by continuity equation (using LVOT flow): 1.47 cm^2. Indexed valve area by continuity equation (using LVOT flow): 0.78 cm^2/m^2. Mean  gradient (D): 3 mm Hg. Peak gradient (D): 6 mm Hg.  ------------------------------------------------------------------- Left atrium:  The atrium was moderately to severely dilated.   ------------------------------------------------------------------- Right ventricle:  The cavity size was normal. Wall thickness was normal. Systolic function was normal.  ------------------------------------------------------------------- Pulmonic valve:    Structurally normal valve.   Cusp separation was normal.  Doppler:  Transvalvular velocity was within the normal range. There was no regurgitation.  ------------------------------------------------------------------- Tricuspid valve:   Structurally normal valve.   Leaflet separation was normal.  Doppler:  Transvalvular velocity was within the normal range. There was no regurgitation.  ------------------------------------------------------------------- Pulmonary artery:   Systolic pressure was moderately elevated.  ------------------------------------------------------------------- Right atrium:  The atrium was normal in size.  ------------------------------------------------------------------- Pericardium:  The pericardium was normal in appearance.  ------------------------------------------------------------------- Systemic veins: Inferior vena cava: The vessel was dilated. The respirophasic diameter changes were blunted (< 50%), consistent with elevated central venous  pressure.  ------------------------------------------------------------------- Measurements   Left ventricle                           Value          Reference  LV ID, ED, PLAX chordal          (H)     63    mm       43 - 52  LV ID, ES, PLAX chordal          (H)     59    mm       23 - 38  LV fx shortening, PLAX chordal   (L)     6     %        >=29  LV PW thickness, ED                      8     mm       ----------  IVS/LV PW ratio, ED              (H)     1.5            <=1.3  Stroke volume, 2D                        41    ml       ----------  Stroke volume/bsa, 2D                    22    ml/m^2   ----------  LV ejection fraction, 1-p A4C            28    %        ----------  LV end-diastolic volume, 2-p             197   ml       ----------  LV end-systolic volume, 2-p              131   ml       ----------  LV ejection fraction, 2-p                34    %        ----------  Stroke volume, 2-p                       66    ml       ----------  LV end-diastolic volume/bsa, 2-p         104   ml/m^2   ----------  LV end-systolic volume/bsa, 2-p          69    ml/m^2   ----------  Stroke volume/bsa, 2-p                   35    ml/m^2   ----------  LV e&', lateral                           7.72  cm/s     ----------  LV E/e&', lateral                         16.06          ----------  LV e&', medial                            4.79  cm/s     ----------  LV E/e&', medial                          25.89          ----------  LV e&', average                           6.26  cm/s     ----------  LV E/e&', average                         19.82          ----------    Ventricular septum                       Value          Reference  IVS thickness, ED                        12    mm       ----------    LVOT                                     Value          Reference  LVOT ID, S                               19    mm       ----------  LVOT area                                2.84  cm^2      ----------  LVOT ID                                  19    mm       ----------  LVOT mean velocity, S                    54.2  cm/s     ----------  LVOT VTI, S                              14.4  cm       ----------  Stroke volume (SV), LVOT DP              40.8  ml       ----------  Stroke index (SV/bsa), LVOT DP           21.6  ml/m^2   ----------    Aortic valve                             Value          Reference  Aortic valve peak velocity, S            342   cm/s     ----------  Aortic valve mean velocity, S            241   cm/s     ----------  Aortic valve VTI, S                      78.7  cm       ----------  Aortic mean gradient, S                  27    mm Hg    ----------  Aortic peak gradient, S                  47    mm Hg    ----------  VTI ratio, LVOT/AV                       0.18           ----------  Aortic valve area, VTI                   0.52  cm^2     ----------  Aortic valve area/bsa, VTI               0.28  cm^2/m^2 ----------  Velocity ratio, mean, LVOT/AV            0.22           ----------  Aortic valve area, mean velocity         0.64  cm^2     ----------  Aortic valve area/bsa, mean              0.34  cm^2/m^2 ----------  velocity  Aortic regurg pressure half-time         313   ms       ----------    Aorta                                    Value          Reference  Aortic root ID, ED                       30    mm       ----------  Ascending aorta ID, A-P, S               33    mm       ----------  Left atrium                              Value          Reference  LA ID, A-P, ES                           47    mm       ----------  LA ID/bsa, A-P                   (H)     2.49  cm/m^2   <=2.2  LA volume, S                             119   ml       ----------  LA volume/bsa, S                         63    ml/m^2   ----------  LA volume, ES, 1-p A4C                   81.7  ml       ----------  LA volume/bsa, ES, 1-p A4C               43.3  ml/m^2    ----------  LA volume, ES, 1-p A2C                   157   ml       ----------  LA volume/bsa, ES, 1-p A2C               83.2  ml/m^2   ----------    Mitral valve                             Value          Reference  Mitral E-wave peak velocity              124   cm/s     ----------  Mitral A-wave peak velocity              100   cm/s     ----------  Mitral mean velocity, D                  81.4  cm/s     ----------  Mitral deceleration time         (L)     148   ms       150 - 230  Mitral pressure half-time                43    ms       ----------  Mitral mean gradient, D                  3     mm Hg    ----------  Mitral peak gradient, D                  6     mm Hg    ----------  Mitral E/A ratio, peak                   1.2            ----------  Mitral valve area, PHT, DP               5.12  cm^2     ----------  Mitral valve area/bsa, PHT, DP           2.71  cm^2/m^2 ----------  Mitral valve area, LVOT                  1.47  cm^2     ----------  continuity  Mitral valve area/bsa, LVOT              0.78  cm^2/m^2 ----------  continuity  Mitral annulus VTI, D                    27.9  cm       ----------    Pulmonary arteries                       Value          Reference  PA pressure, S, DP               (H)     57    mm Hg    <=30    Tricuspid valve                          Value          Reference  Tricuspid regurg peak velocity           303   cm/s     ----------  Tricuspid peak RV-RA gradient            37    mm Hg    ----------    Right atrium                             Value          Reference  RA ID, S-I, ES, A4C              (H)     57.6  mm       34 - 49  RA area, ES, A4C                 (H)     21.9  cm^2     8.3 - 19.5  RA volume, ES, A/L                       64    ml       ----------  RA volume/bsa, ES, A/L                   33.9  ml/m^2   ----------    Right ventricle                          Value          Reference  TAPSE                                    18.4   mm       ----------  RV s&', lateral, S  8.16  cm/s     ----------    Pulmonic valve                           Value          Reference  Pulmonic valve peak velocity, S          74.2  cm/s     ----------  Pulmonic regurg velocity, ED             178   cm/s     ----------  Pulmonic regurg gradient, ED             13    mm Hg    ----------  Legend: (L)  and  (H)  mark values outside specified reference range.  ------------------------------------------------------------------- Prepared and Electronically Authenticated by  Esmond Plants, MD, [facility] 2019-11-15T15:28:04    RIGHT/LEFT HEART CATH AND CORONARY/GRAFT ANGIOGRAPHY  Conclusion     Ost RCA to Prox RCA lesion is 100% stenosed.  Prox Cx lesion is 70% stenosed.  Mid Cx lesion is 30% stenosed.   1.  Significant two-vessel coronary artery disease with heavily calcified vessels.  The RCA is occluded at the ostium with reasonable left-to-right collaterals.  The left circumflex has 70% proximal stenosis.  The LAD has mild nonobstructive disease. 2.  Severe aortic stenosis with a mean gradient of 24.7 mmHg with a calculated valve area of 0.81.  The valve leaflets appear heavily calcified on fluoroscopy with significantly restricted opening. 3.  Severe aortic root and proximal ascending aorta calcifications. 4.  Right heart catheterization showed moderately to severely elevated filling pressures, moderate pulmonary hypertension and mildly reduced cardiac output.  Pulmonary capillary wedge pressure was 29 mmHg with PA pressure of 55/32 mmHg.  Cardiac output was 4.42 with a cardiac index of 2.35. 5.  Difficulty filling the coronary arteries with angiography due to calcifications and aortic stenosis.  A 6 [place] guide could not be advanced beyond the elbow area due to suspected stenosis.  Recommendations: The patient continues to be volume overloaded.  Resume intravenous diuresis tomorrow. Transferred to [facility]  for evaluation of AVR plus two-vessel CABG versus TAVR.  The patient is overall high risk due to his comorbidities, low EF and underlying chronic kidney disease.    Indications   Acute on chronic systolic heart failure (HCC) [I50.23 (ICD-10-CM)]  Nonrheumatic aortic valve stenosis [I35.0 (ICD-10-CM)]  Procedural Details/Technique   Technical Details Procedural Details: The pre-existing IV in the right antecubital vein was exchanged under sterile fashion to a slender sheath. The right wrist was prepped, draped, and anesthetized with 1% lidocaine. Using the modified Seldinger technique, a 5 French sheath was introduced into the right radial artery. 2.5 mg of verapamil was administered through the sheath, weight-based unfractionated heparin was administered intravenously. Right heart catheterization was performed using a 5 French Swan-Ganz catheter. Cardiac output was calculated by the Fick method. A Jackie catheter was used for selective coronary angiography. I used this catheter to cross the aortic valve and record pressure with pullback. I could not engage the right coronary artery with this catheter. Left coronary angiography was also suboptimal due to difficulty filling the coronary arteries due to severe aortic stenosis. I attempted to use an EBU 3.56 French catheter but was not able to advance it beyond the elbow area due to suspected stenosis there. Thus, I went back with a JL 3.5 catheter. There were no immediate procedural complications. A TR band  was used for radial hemostasis at the completion of the procedure. The patient was transferred to the post catheterization recovery area for further monitoring.   Estimated blood loss <50 mL.  During this procedure the patient was administered the following to achieve and maintain moderate conscious sedation: Versed 0.5 mg, Fentanyl 25 mcg, while the patient's heart rate, blood pressure, and oxygen saturation were continuously monitored. The period  of conscious sedation was 41 minutes, of which I was present face-to-face 100% of this time.  Coronary Findings   Diagnostic  Dominance: Right  Left Main  The vessel exhibits minimal luminal irregularities.  Left Anterior Descending  There is mild diffuse disease throughout the vessel.  First Septal Branch  Left Circumflex  Prox Cx lesion 70% stenosed  Prox Cx lesion is 70% stenosed. The lesion is severely calcified.  Mid Cx lesion 30% stenosed  Mid Cx lesion is 30% stenosed.  Right Coronary Artery  Ost RCA to Prox RCA lesion 100% stenosed  Ost RCA to Prox RCA lesion is 100% stenosed.  Right Posterior Atrioventricular Branch  Collaterals  Post Atrio filled by collaterals from Dist Cx.    Intervention   No interventions have been documented.  Coronary Diagrams   Diagnostic Diagram       Implants    No implant documentation for this case.  MERGE Images   Show images for CARDIAC CATHETERIZATION   Link to Procedure Log   Procedure Log    Hemo Data 01/31/2018 1159--02/07/2018 1316 before discharge   AO Systolic Cath Pressure AO Diastolic Cath Pressure AO Mean Cath Pressure LV Systolic Cath Pressure LV End Diastolic PA Systolic Cath Pressure PA Diastolic Cath Pressure PA Mean Cath Pressure RV Systolic Cath Pressure RV Diastolic Cath Pressure RV End Diastolic AO O2 Sat PA O2 Sat AO O2 Sat  - - - - - - - - 52 mmHg 13 mmHg 19 mmHg - - -  - - - - - 55 mmHg 32 mmHg 42 mmHg - - - - - -  91 54 mmHg 57 mmHg - - - - - - - - - - -  - - - 110 mmHg 24 mmHg - - - - - - - - -  - - - 124 mmHg 24 mmHg - - - - - - - - -  - - - 124 mmHg 28 mmHg - - - - - - - - -  91 56 mmHg 71 mmHg - - - - - - - - - - -  - - - - - - - - - - - - PA -  - - - - - - - - - - - 95.9 % - SA    STS Risk Calculator  STS Adult Cardiac Surgery Database Version 2.9 RISK SCORES Procedure: AVR + CAB CALCULATE   Risk of Mortality:  6.977% Renal Failure:  12.572% Permanent Stroke:  1.972% Prolonged  Ventilation:  39.867% DSW Infection:  0.326% Reoperation:  5.897% Morbidity or Mortality:  51.019% Short Length of Stay:  8.470% Long Length of Stay:  29.630%     Impression:  Patient presents with acute on chronic combined systolic and diastolic congestive heart failure, [place] Heart Association functional class IV.  He is tachycardic with relatively low blood pressure, and right-sided pressures are elevated.  I have personally reviewed the patient's transthoracic echocardiogram and diagnostic cardiac catheterization.  Echocardiogram demonstrates the presence of severe low gradient, low ejection fraction aortic stenosis.  Aortic valve is  heavily calcified with severe thickening, calcification, and restricted leaflet mobility involving all leaflets.  Leaflet morphology so distorted it is difficult to tell whether or not the valve is trileaflet or bicuspid.  Peak velocity across the aortic valve measured 3.4 m/s corresponding to mean transvalvular gradient estimated 27 mmHg.  The DVI was 0.18 and stroke-volume index 21.6 mL/m, all consistent with the presence of severe aortic stenosis.  There is also at least moderate mitral regurgitation.  Functional anatomy of the mitral valve appears consistent with secondary mitral regurgitation caused by type III and type I dysfunction in the setting of decompensated congestive heart failure.  Left ventricular ejection fraction was estimated 20 to 25% and visually appears considerably worse.  Diagnostic cardiac catheterization revealed severe three-vessel coronary artery disease with 100% proximal occlusion of the right coronary artery.  There is left-to-right collateral filling of the terminal branches of the right coronary system.  There is mild nonobstructive disease in the left anterior descending coronary artery.  There is discrete stenosis of the proximal left circumflex coronary artery percent but is visually not critical in appearance.  Right heart  catheterization revealed moderate to severe pulmonary hypertension with PA pressures measured 55/32 and pulmonary capillary wedge pressure 29 mmHg.  Options for management include high risk aortic valve replacement, coronary artery bypass grafting, with or without mitral valve repair versus transcatheter aortic valve replacement followed by medical therapy for management of the patient's coronary artery disease and secondary mitral regurgitation.  I feel that risks associated with conventional surgery under the current circumstances would be very high and much higher than that predicted using the STS risk calculator.  I am concerned that the patient would likely do poorly with conventional surgery because of the severity of left ventricular systolic dysfunction and comorbid problems including severe COPD and chronic kidney disease.  At this juncture the patient appears acutely decompensated and will need aggressive medical therapy prior to any type of definitive intervention.    Plan:  I discussed the nature of the patient's complex cardiac problems at length with the patient in his hospital room this afternoon.  We discussed the presence of severe symptomatic aortic stenosis, coronary artery disease, severe LV systolic dysfunction and secondary mitral regurgitation,  We discussed options for treatment including conventional surgical aortic valve replacement and CABG with or without mitral valve repair, transcatheter aortic valve replacement, and long-term palliative medical therapy without any intervention.  We discussed severity of his underlying left ventricular systolic dysfunction and multivessel coronary artery disease.  We discussed the presence of secondary mitral regurgitation and the confounding influence of the patient's multiple comorbid medical problems including long-standing tobacco abuse, likely severe COPD, and chronic kidney disease.    At this point we favor an approach including  aggressive medical therapy (perhaps to include invasive monitoring) in effort to improve the patient's acutely decompensated condition prior to any intervention.  Once renal function is felt to be stable the patient will need to undergo cardiac gated CT angiogram of the heart and CT angiogram of the aorta and iliac vessels to investigate whether or not patient will be considered a candidate for transcatheter aortic valve replacement.  We will continue to follow along during the interim.  All questions answered.   I spent in excess of 120 minutes during the conduct of this hospital consultation and >50% of this time involved direct face-to-face encounter for counseling and/or coordination of the patient's care.    Valentina Gu. Roxy Manns, MD 02/06/2018 2:56 PM

## 2018-02-06 NOTE — Consult Note (Addendum)
Advanced Heart Failure Team Consult Note   Primary Physician: Jodelle Green, FNP PCP-Cardiologist:  Dr End  Reason for Consultation: Heart Failure   HPI:    George Calvin. is seen today for evaluation of heart failure at the request of Dr Marlou Porch.   George Washington is a 77 year old with a history of recently diagnosed systolic heart failure earlier this month, HTN, iron deficiency,CKD III and tobacco abuse.    In October he was evaluated by PCP due to dizziness so HCTZ was stopped. He had ongoing dizziness and notice increased pulse, and increased lower extremity edema.  but he had ongoing low BP and dizziness.  He returned to PCP on 11/12 and had an EKG that showed LBBB and sinus tach. CXR showed CHF so he was started on lasix 20 mg daily.  He was sent to Cardiology for consultation. On 11/14 he was evaluated by Ignacia Bayley NP. He had an ECHO later that day that showed reduced EF ~30% and severe AS and severe George.  He was set up for RHC/LHC on 01/31/2018.   Admitted to [facility] on 01/31/2018 with increased lower extremity edema and shortness of breath. He underwent RHC/LHC as noted below. Discharged on 11/25 to [facility] for CT surgery/structural heart consultation.   Admitted to [facility] 11/25 for consideration of TAVR and CABG. Pertinent admission labs include: sodium 128, creatinine 2.1, BNP >4500, troponin 0.09. CXR with vascular congestion. He has been diuresing with 40 mg IV lasix. Sluggish diuresis noted. Weight unchanged. PICC line ordered today.   Mild dyspnea with exertion.   RHC/LHC 01/30/2018    Ost RCA to Prox RCA lesion is 100% stenosed.  Prox Cx lesion is 70% stenosed.  Mid Cx lesion is 30% stenosed.  1.  Significant two-vessel coronary artery disease with heavily calcified vessels.  The RCA is occluded at the ostium with reasonable left-to-right collaterals.  The left circumflex has 70% proximal stenosis.  The LAD has mild nonobstructive disease. 2.  Severe aortic stenosis with a mean  gradient of 24.7 mmHg with a calculated valve area of 0.81.  The valve leaflets appear heavily calcified on fluoroscopy with significantly restricted opening. 3.  Severe aortic root and proximal ascending aorta calcifications. 4.  Right heart catheterization showed moderately to severely elevated filling pressures, moderate pulmonary hypertension and mildly reduced cardiac output.  Pulmonary capillary wedge pressure was 29 mmHg with PA pressure of 55/32 mmHg.  Cardiac output was 4.42 with a cardiac index of 2.35. 5.  Difficulty filling the coronary arteries with angiography due to calcifications and aortic stenosis.  A 6 [place] guide could not be advanced beyond the elbow area due to suspected stenosis. Recommendations: The patient continues to be volume overloaded.  Resume intravenous diuresis tomorrow. Transferred to [facility] for evaluation of AVR plus two-vessel CABG versus TAVR.  The patient is overall high risk due to his comorbidities, low EF and underlying chronic kidney disease.  ECHO 01/26/2018  Left ventricle: The cavity size was moderately dilated. Systolic   function was severely reduced. The estimated ejection fraction   was in the range of 20% to 25%. Diffuse hypokinesis. Hypokinesis   of the inferior myocardium. Hypokinesis of the inferolateral   myocardium. Hypokinesis of the lateral myocardium. Features are   consistent with a pseudonormal left ventricular filling pattern,   with concomitant abnormal relaxation and increased filling   pressure (grade 2 diastolic dysfunction). - Aortic valve: There was moderate stenosis by mean gradient and  peak velocity. Possibly severe by estimated AVA, may be   under-estimated secondary to severely reduced LV function. There   was mild to moderate regurgitation. Peak velocity (S): 342 cm/s.   Mean gradient (S): 27 mm Hg. Valve area (VTI): 0.52 cm^2. - Mitral valve: There was moderate regurgitation. Valve area by   continuity equation  (using LVOT flow): 1.47 cm^2. - Left atrium: The atrium was moderately to severely dilated. - Pulmonary arteries: Systolic pressure was moderately elevated. PA   peak pressure: 57 mm Hg (S). - Inferior vena cava: The vessel was dilated. The respirophasic   diameter changes were blunted (< 50%), consistent with elevated   central venous pressure.  Review of Systems: [y] = yes, [ ]  = no   General: Weight gain [ Y]; Weight loss [ ] ; Anorexia [ ] ; Fatigue [Y ]; Fever [ ] ; Chills [ ] ; Weakness [ ]   Cardiac: Chest pain/pressure [ ] ; Resting SOB [ ] ; Exertional SOB [Y ]; Orthopnea [ ] ; Pedal Edema [Y ]; Palpitations [ ] ; Syncope [ ] ; Presyncope [ ] ; Paroxysmal nocturnal dyspnea[ ]   Pulmonary: Cough [ ] ; Wheezing[ ] ; Hemoptysis[ ] ; Sputum [ ] ; Snoring [ ]   GI: Vomiting[ ] ; Dysphagia[ ] ; Melena[ ] ; Hematochezia [ ] ; Heartburn[ ] ; Abdominal pain [ ] ; Constipation [ ] ; Diarrhea [ ] ; BRBPR [ ]   GU: Hematuria[ ] ; Dysuria [ ] ; Nocturia[ ]   Vascular: Pain in legs with walking [ ] ; Pain in feet with lying flat [ ] ; Non-healing sores [ ] ; Stroke [ ] ; TIA [ ] ; Slurred speech [ ] ;  Neuro: Headaches[ ] ; Vertigo[ ] ; Seizures[ ] ; Paresthesias[ ] ;Blurred vision [ ] ; Diplopia [ ] ; Vision changes [ ]   Ortho/Skin: Arthritis [ ] ; Joint pain [Y ]; Muscle pain [ ] ; Joint swelling [ ] ; Back Pain [ Y]; Rash [ ]   Psych: Depression[ ] ; Anxiety[ ]   Heme: Bleeding problems [ ] ; Clotting disorders [ ] ; Anemia [ Y]  Endocrine: Diabetes [ ] ; Thyroid dysfunction[ ]   Home Medications Prior to Admission medications   Medication Sig Start Date End Date Taking? Authorizing Provider  ALPRAZolam (XANAX) 0.25 MG tablet Take 1 tablet (0.25 mg total) by mouth 2 (two) times daily as needed for anxiety. 01/24/18  Yes Guse, Jacquelynn Cree, FNP  aspirin 81 MG chewable tablet Chew 1 tablet (81 mg total) by mouth daily. 01/23/2018  Yes Mayo, Pete Pelt, MD  aspirin-acetaminophen-caffeine [facility] MIGRAINE) [phone] MG per tablet Take 1 tablet by  mouth every 6 (six) hours as needed for headache.    Yes [provider]  atorvastatin (LIPITOR) 80 MG tablet Take 1 tablet (80 mg total) by mouth daily at 6 PM. 01/31/2018  Yes Mayo, Pete Pelt, MD  furosemide (LASIX) 40 MG tablet Take 1 tablet (40 mg total) by mouth daily. Patient taking differently: Take 20 mg by mouth daily.  01/25/18 04/25/18 Yes Theora Gianotti, NP  gabapentin (NEURONTIN) 100 MG capsule Take 1 capsule (100 mg total) by mouth 3 (three) times daily. 11/06/17  Yes Rosemarie Ax, MD  Multiple Vitamins-Minerals (CENTRUM SILVER PO) Take 1 tablet by mouth daily.   Yes [provider]  traMADol (ULTRAM) 50 MG tablet Take 1 tablet (50 mg total) by mouth every 8 (eight) hours as needed. 09/27/17  Yes Lucille Passy, MD  traZODone (DESYREL) 50 MG tablet TAKE 0.5-1 TABLET EVERY NIGHT AT BEDTIME AS NEEDED FOR SLEEP 12/12/17  Yes Lucille Passy, MD    Past Medical History: Past Medical History:  Diagnosis  Date  . Arthritis   . CAD (coronary artery disease)   . CKD (chronic kidney disease), stage III ([place])   . HTN (hypertension)   . Iron deficiency anemia   . Ischemic cardiomyopathy   . Mitral regurgitation   . Narcotic dependence, in remission ([place])   . Retinal vein occlusion   . Severe aortic stenosis   . Systolic heart failure ([place]) 01/2018  . Tobacco abuse     Past Surgical History: Past Surgical History:  Procedure Laterality Date  . cyst chest  1994   left  . EYE SURGERY    . rectal fissure  1968  . RIGHT/LEFT HEART CATH AND CORONARY/GRAFT ANGIOGRAPHY N/A 02/03/2018   Procedure: RIGHT/LEFT HEART CATH AND CORONARY/GRAFT ANGIOGRAPHY;  Surgeon: Wellington Hampshire, MD;  Location: [facility];  Service: Cardiovascular;  Laterality: N/A;  . vro      Family History: Family History  Problem Relation Age of Onset  . Hyperlipidemia Father   . Hypertension Father   . Heart attack Father   . Stroke Father        died @ 75  . Heart  disease Mother        died @ 81  . High Cholesterol Brother   . High blood pressure Brother   . Heart disease Sister 44       MI, CABG  . High Cholesterol Sister   . High blood pressure Sister   . Colon cancer Neg Hx   . Stomach cancer Neg Hx     Social History: Social History   Socioeconomic History  . Marital status: Married    Spouse name: Not on file  . Number of children: 2  . Years of education: Not on file  . Highest education level: Not on file  Occupational History  . Occupation: Scientist, research (physical sciences): [employer]    Comment: RETIRED  Social Needs  . Financial resource strain: Not on file  . Food insecurity:    Worry: Not on file    Inability: Not on file  . Transportation needs:    Medical: Not on file    Non-medical: Not on file  Tobacco Use  . Smoking status: Current Every Day Smoker    Packs/day: 0.50    Years: 30.00    Pack years: 15.00    Types: Cigarettes  . Smokeless tobacco: Never Used  . Tobacco comment: pt refused materials  Substance and Sexual Activity  . Alcohol use: Yes    Alcohol/week: 0.0 standard drinks    Comment: beer or wine occassionally  . Drug use: No  . Sexual activity: Yes  Lifestyle  . Physical activity:    Days per week: Not on file    Minutes per session: Not on file  . Stress: Not on file  Relationships  . Social connections:    Talks on phone: More than three times a week    Gets together: More than three times a week    Attends religious service: More than 4 times per year    Active member of club or organization: Yes    Attends meetings of clubs or organizations: More than 4 times per year    Relationship status: Married  Other Topics Concern  . Not on file  Social History Narrative   Lives locally with wife George Washington.   Does not routinely exercise.   Retired Software engineer (retail).   He does attend NA meetings.  Does have a living will- does not want prolonged life support if futile.     Allergies:  Allergies  Allergen Reactions  . Lisinopril     cough    Objective:    Vital Signs:   Temp:  [98.1 F (36.7 C)-98.5 F (36.9 C)] 98.1 F (36.7 C) (11/26 0528) Pulse Rate:  [82-108] 102 (11/26 1000) Resp:  [18-26] 18 (11/25 1653) BP: (94-113)/(58-93) 106/75 (11/26 1000) SpO2:  [85 %-97 %] 97 % (11/26 0855) Weight:  [71.3 kg-71.4 kg] 71.3 kg (11/26 0528) Last BM Date: 02/04/18  Weight change: Filed Weights   01/25/2018 1847 02/06/18 0528  Weight: 71.4 kg 71.3 kg    Intake/Output:   Intake/Output Summary (Last 24 hours) at 02/06/2018 1134 Last data filed at 02/06/2018 1127 Gross per 24 hour  Intake 240 ml  Output 925 ml  Net -685 ml      Physical Exam    General:  No resp difficulty. In bed.  HEENT: normal Neck: supple. JVP to ear . Carotids 2+ bilat; no bruits. No lymphadenopathy or thyromegaly appreciated. Cor: PMI nondisplaced. Regular rate & rhythm. No rubs, gallops.  RUSB 2/6  murmur. Lungs: crackles in the bases Abdomen: soft, nontender, nondistended. No hepatosplenomegaly. No bruits or masses. Good bowel sounds. Extremities: warm, no cyanosis, clubbing, rash, R and LLE 2+ edema Neuro: alert & orientedx3, cranial nerves grossly intact. moves all 4 extremities w/o difficulty. Affect pleasant   Telemetry  Sinus Tach 100s   EKG    NSR 96 bpm QRS 126 ms  Labs   Basic Metabolic Panel: Recent Labs  Lab 02/03/18 0609 02/04/18 0410 02/04/18 0904 02/04/18 1603 01/13/2018 0007 02/06/18 0126  NA 126* 125* 124* 124* 126* 128*  K 4.7 5.4* 5.5*  --  4.1 4.0  CL 84* 85* 81*  --  81* 83*  CO2 31 30 28   --  31 31  GLUCOSE 108* 111* 161*  --  134* 129*  BUN 39* 42* 43*  --  41* 39*  CREATININE 1.93* 1.96* 2.05*  --  1.90* 2.07*  CALCIUM 9.4 9.4 9.7  --  9.1 9.5    Liver Function Tests: Recent Labs  Lab 02/06/18 0126  AST 39  ALT 38  ALKPHOS 63  BILITOT 1.0  PROT 6.8  ALBUMIN 3.5   No results for input(s): LIPASE, AMYLASE in the  last 168 hours. No results for input(s): AMMONIA in the last 168 hours.  CBC: Recent Labs  Lab 01/31/18 1908 02/06/18 0636  WBC 5.1 4.9  NEUTROABS  --  3.2  HGB 10.4* 9.4*  HCT 33.3* 30.5*  MCV 84.5 82.7  PLT 202 209    Cardiac Enzymes: Recent Labs  Lab 01/27/2018 1932 02/06/18 0126 02/06/18 0636  TROPONINI 0.10* 0.09* 0.08*    BNP: BNP (last 3 results) Recent Labs    02/06/18 0126  BNP >4,500.0*    ProBNP (last 3 results) No results for input(s): PROBNP in the last 8760 hours.   CBG: No results for input(s): GLUCAP in the last 168 hours.  Coagulation Studies: No results for input(s): LABPROT, INR in the last 72 hours.   Imaging   Dg Chest 2 View  Result Date: 02/06/2018 CLINICAL DATA:  Shortness of breath and leg swelling for 2 weeks EXAM: CHEST - 2 VIEW COMPARISON:  01/23/2018 FINDINGS: Cardiac shadow remains enlarged. The degree of vascular congestion has improved slightly in the interval. Bilateral pleural effusions with underlying atelectatic changes are again seen but improved on  the right. No bony abnormality is seen. IMPRESSION: Improved effusion on the right although bilateral small effusions remain. Underlying atelectatic changes are noted. Electronically Signed   By: Inez Catalina M.D.   On: 02/06/2018 08:51   [place] Ekg Site Rite  Result Date: 02/06/2018 If Site Rite image not attached, placement could not be confirmed due to current cardiac rhythm.     Medications:     Current Medications: . aspirin EC  81 mg Oral Daily  . atorvastatin  80 mg Oral q1800  . budesonide (PULMICORT) nebulizer solution  0.25 mg Nebulization BID  . docusate sodium  100 mg Oral BID  . enoxaparin (LOVENOX) injection  30 mg Subcutaneous Q24H  . furosemide  40 mg Intravenous BID  . gabapentin  100 mg Oral TID  . ipratropium-albuterol  3 mL Nebulization TID  . nicotine  21 mg Transdermal Daily  . sodium chloride flush  3 mL Intravenous Q12H  . traZODone  100 mg Oral  QHS     Infusions: . sodium chloride         Patient Profile   George Washington is a 77 year old with a history of recently diagnosed systolic heart failure earlier this month, HTN, iron deficiency,CKD III and tobacco abuse.    Admitted from [facility] for CT surgery and structural heart consultation this is complicated by marked volume overload and worsening renal function.    Assessment/Plan   1. Acute Systolic Heart Failure , ICM/NICM with valvular disease .  ECHO 01/2013 EF ~30%. Severe AS . Suspect low output heart failure with + S3 and tachycardia. Check TSH.  Marked volume overload. Increase lasix to 80 mg twice a day. Place PICC to guide diuresis and will check daily CO-OX.  Start low dose milrinone 0.125 mcg and watch BP closely.  No BB with suspected low output. No Arb, dig, spiro with elevated creatinine.  Add compression stockings.  2. CAD,  LHC 01/31/2018 with Ost RCA to Prox RCA lesion 100% stenosed, Prox CX lesion 70%, and mid Cx lesion 30% stenosed. CT surgery consulted.  No chest pain. Continue aspirin and statin.  3. Severe Aortic Stenosis  ECHO 01/25/2018 Dr Angelena Form and Dr Roxy Manns will consult.   4. CKD Stage III Creatinine on admit 1.9. Trending up to 2.1 today. Add milrinone as noted above.  Follow BMET daily. 5. George  Hopefully will improve with diuresis.  6. Anemia  Iron sats 9% on 01/23/2018 . I do not see that he has received feraheme. Will order feraheme.   Hgb 9.4. No overt signs of bleeding. Will check anemia panel  7. Hyponatremia.   Sodium 128. Follow daily. May need tolvaptan.   Medication concerns reviewed with patient and pharmacy team. Barriers identified: n/a  Length of Stay: 1  Amy Clegg, NP  02/06/2018, 11:34 AM  Advanced Heart Failure Team Pager [phone] (M-F; 7a - 4p)  Please contact [facility] for night-coverage after hours (4p -7a ) and weekends on [email or web address]  Patient seen with NP, agree with the above note.   Patient was admitted with  exertional dyspnea to [facility], found to have low gradient severe AS + EF 20-25%.  LHC/RHC showed elevated filling pressures and low gradient severe AS.  RCA was occluded with 70% proximal LCx.    He was transferred to [facility] for TAVR versus AVR + CABG.  He has diuresed poorly and creatinine is up to around 2.  He is in NSR with IVCD.    On exam,  JVP 48-88 cm.  3/6 systolic murmur RUSB with muffled S2.  1+ edema to knees. Crackles at bases bilaterally.   1. Aortic stenosis: Low gradient severe AS by cath and echo.  With low EF, renal dysfunction, and suspected COPD, I think that the best course will be TAVR rather than CABG/AVR, which would be high risk.  We need to improve volume status and renal function prior to procedure.   2. Mitral regurgitation: Moderate by 11/14 echo, hopefully will improve with TAVR.  3. CAD: Cath 11/19 with chronically occluded RCA and 70% LCx (LCx is not a critical lesion). No chest pain.  Would aim for medical management at this time.  - Continue ASA 81 and statin. 4. Acute on chronic systolic CHF: Echo (91/69) with EF 20-25% in setting of low gradient severe AS.  Suspect mixed cardiomyopathy from CAD (occluded RCA) and severe AS.  Resolution of AS may help LV systolic function.  On exam, he is volume overloaded.  Diuresis has been limited by worsening renal function, creatinine 2 today.  - Place PICC to follow CVP and co-ox.  - Lasix 80 mg IV bid, start now.  - milrinone 0.25 mcg/kg/min.  Will start empirically with rising creatinine and ongoing significant volume overload.  5. AKI on CKD 3: Creatinine up to 2, baseline may be around 1.5-1.6.  Needs diuresis, will start milrinone as above to support cardiac output. Follow creatinine closely.  6. Fe deficiency anemia: Will give feraheme.  7. Hyponatremia: Fluid restrict.   Loralie Champagne 02/06/2018 1:24 PM

## 2018-02-06 NOTE — Progress Notes (Addendum)
Progress Note  Patient Name: George Washington. Date of Encounter: 02/06/2018  Primary Cardiologist:  Kathlyn Sacramento, MD  Subjective   Breathing better than previously, no chest pain.   Inpatient Medications    Scheduled Meds: . aspirin EC  81 mg Oral Daily  . atorvastatin  80 mg Oral q1800  . budesonide (PULMICORT) nebulizer solution  0.25 mg Nebulization BID  . docusate sodium  100 mg Oral BID  . enoxaparin (LOVENOX) injection  40 mg Subcutaneous Q24H  . furosemide  40 mg Intravenous BID  . gabapentin  100 mg Oral TID  . ipratropium-albuterol  3 mL Nebulization TID  . nicotine  21 mg Transdermal Daily  . sodium chloride flush  3 mL Intravenous Q12H  . traZODone  100 mg Oral QHS   Continuous Infusions: . sodium chloride     PRN Meds: sodium chloride, acetaminophen, ALPRAZolam, bisacodyl, bisacodyl, guaiFENesin, HYDROcodone-acetaminophen, nitroGLYCERIN, ondansetron, ondansetron (ZOFRAN) IV, polyvinyl alcohol, senna-docusate, sodium chloride flush, traMADol   Vital Signs    Vitals:   01/17/2018 2205 02/06/18 0155 02/06/18 0528 02/06/18 0855  BP: 109/78  108/75   Pulse: (!) 108  92   Temp: 98.3 F (36.8 C)  98.1 F (36.7 C)   TempSrc: Oral  Oral   SpO2: 90% (!) 85% 97% 97%  Weight:   71.3 kg   Height:        Intake/Output Summary (Last 24 hours) at 02/06/2018 0904 Last data filed at 01/24/2018 2205 Gross per 24 hour  Intake -  Output 225 ml  Net -225 ml   Filed Weights   01/17/2018 1847 02/06/18 0528  Weight: 71.4 kg 71.3 kg    Telemetry    SR, ST, 2 brief episodes ?atrial tach, no sx - Personally Reviewed  ECG    11/26, SR, HR 96, ?inc LBBB - Personally Reviewed  Physical Exam   General: Well developed, well nourished, male appearing in mild respiratory distress. Head: Normocephalic, atraumatic.  Neck: Supple without bruits, JVD to jaw. Lungs:  Resp regular and unlabored, +wheeze, +rales. Heart: RRR, S1, S2, no S3, S4, 2/6  murmur; no  rub. Abdomen: Soft, non-tender, non-distended with normoactive bowel sounds. No hepatomegaly. No rebound/guarding. No obvious abdominal masses. Extremities: No clubbing, cyanosis, 2+ edema. Radial pulses are 2+ bilaterally. R radial cath site w/out ecchymosis or hematoma Neuro: Alert and oriented X 3. Moves all extremities spontaneously. Psych: Normal affect.  Labs    Hematology Recent Labs  Lab 01/31/18 1908 02/06/18 0636  WBC 5.1 4.9  RBC 3.94* 3.69*  HGB 10.4* 9.4*  HCT 33.3* 30.5*  MCV 84.5 82.7  MCH 26.4 25.5*  MCHC 31.2 30.8  RDW 16.5* 16.0*  PLT 202 209    Chemistry Recent Labs  Lab 02/04/18 0904 02/04/18 1603 01/18/2018 0007 02/06/18 0126  NA 124* 124* 126* 128*  K 5.5*  --  4.1 4.0  CL 81*  --  81* 83*  CO2 28  --  31 31  GLUCOSE 161*  --  134* 129*  BUN 43*  --  41* 39*  CREATININE 2.05*  --  1.90* 2.07*  CALCIUM 9.7  --  9.1 9.5  PROT  --   --   --  6.8  ALBUMIN  --   --   --  3.5  AST  --   --   --  39  ALT  --   --   --  38  ALKPHOS  --   --   --  44  BILITOT  --   --   --  1.0  GFRNONAA 30*  --  32* 29*  GFRAA 34*  --  38* 34*  ANIONGAP 15  --  14 14     Cardiac Enzymes Recent Labs  Lab 02/08/2018 1932 02/06/18 0126 02/06/18 0636  TROPONINI 0.10* 0.09* 0.08*      BNP Recent Labs  Lab 02/06/18 0126  BNP >4,500.0*     Radiology    Dg Chest 2 View  Result Date: 02/06/2018 CLINICAL DATA:  Shortness of breath and leg swelling for 2 weeks EXAM: CHEST - 2 VIEW COMPARISON:  01/23/2018 FINDINGS: Cardiac shadow remains enlarged. The degree of vascular congestion has improved slightly in the interval. Bilateral pleural effusions with underlying atelectatic changes are again seen but improved on the right. No bony abnormality is seen. IMPRESSION: Improved effusion on the right although bilateral small effusions remain. Underlying atelectatic changes are noted. Electronically Signed   By: Inez Catalina M.D.   On: 02/06/2018 08:51   Cardiac Studies    Cardiac CATH: 01/21/2018   Ost RCA to Prox RCA lesion is 100% stenosed.  Prox Cx lesion is 70% stenosed.  Mid Cx lesion is 30% stenosed.   1.  Significant two-vessel coronary artery disease with heavily calcified vessels.  The RCA is occluded at the ostium with reasonable left-to-right collaterals.  The left circumflex has 70% proximal stenosis.  The LAD has mild nonobstructive disease. 2.  Severe aortic stenosis with a mean gradient of 24.7 mmHg with a calculated valve area of 0.81.  The valve leaflets appear heavily calcified on fluoroscopy with significantly restricted opening. 3.  Severe aortic root and proximal ascending aorta calcifications. 4.  Right heart catheterization showed moderately to severely elevated filling pressures, moderate pulmonary hypertension and mildly reduced cardiac output.  Pulmonary capillary wedge pressure was 29 mmHg with PA pressure of 55/32 mmHg.  Cardiac output was 4.42 with a cardiac index of 2.35. 5.  Difficulty filling the coronary arteries with angiography due to calcifications and aortic stenosis.  A 6 [place] guide could not be advanced beyond the elbow area due to suspected stenosis.  Recommendations: The patient continues to be volume overloaded.  Resume intravenous diuresis tomorrow. Transferred to [facility] for evaluation of AVR plus two-vessel CABG versus TAVR.  The patient is overall high risk due to his comorbidities, low EF and underlying chronic kidney disease.  Diagnostic Diagram          ECHO:  01/25/2018 - Left ventricle: The cavity size was moderately dilated. Systolic   function was severely reduced. The estimated ejection fraction   was in the range of 20% to 25%. Diffuse hypokinesis. Hypokinesis   of the inferior myocardium. Hypokinesis of the inferolateral   myocardium. Hypokinesis of the lateral myocardium. Features are   consistent with a pseudonormal left ventricular filling pattern,   with concomitant abnormal  relaxation and increased filling   pressure (grade 2 diastolic dysfunction). - Aortic valve: There was moderate stenosis by mean gradient and   peak velocity. Possibly severe by estimated AVA, may be   under-estimated secondary to severely reduced LV function. There   was mild to moderate regurgitation. Peak velocity (S): 342 cm/s.   Mean gradient (S): 27 mm Hg. Valve area (VTI): 0.52 cm^2. - Mitral valve: There was moderate regurgitation. Valve area by   continuity equation (using LVOT flow): 1.47 cm^2. - Left atrium: The atrium was moderately to severely dilated. - Pulmonary arteries: Systolic pressure  was moderately elevated. PA   peak pressure: 57 mm Hg (S). - Inferior vena cava: The vessel was dilated. The respirophasic   diameter changes were blunted (< 50%), consistent with elevated   central venous pressure.  Impressions:  - Left pleural effusion noted, 5.7 cm.  Patient Profile     77 y.o. male w/ hx  HFrEF (20-25%), hypertension, CKDIII, tobacco use, and arthritis with newly diagnosed heart failure and exacerbation, s/p R/L heart cath 11/25 by Dr. Fletcher Anon. Transferred from [facility] to [facility] for consideration of AVR + 2v CABG vs TAVR and CHF management.   Assessment & Plan     Principal Problem: 1.   Acute on chronic systolic (congestive) heart failure (HCC) - EF 20-25% by recent echo with grade 2 diastolic dysfunction - Still with significant volume overload, but creatinine is elevated - Very little urine output noted overnight.  However, he did not get Lasix last night. -He is due to start Lasix 40 mg IV twice daily today but I am not sure that dose is adequate - Diuresis is limited by poor renal function and hyponatremia with a sodium nadir of 124, currently in 128 -He needs to be on a beta-blocker, hydralazine and nitrates.  However, blood pressure limits adding these medications.  Discuss timing with MD. -May need milrinone -May also benefit from CHF team  consult  2.  CAD: -No ischemic symptoms, continue aspirin and high-dose statin  3.  Severe aortic stenosis - Needs surgical consult for CAD and if not a candidate for open heart surgery, TAVR consult  4.  CKD III-IV - Creatinine clearance currently 27 - Continue to follow closely  5.  Tachycardia: -Reviewed telemetry with MD to make sure he is not having episodes of atrial fibrillation  Otherwise, continue current medical therapy Active Problems:   Essential hypertension   Chronic kidney disease, stage III (moderate) (HCC)   Anemia, iron deficiency   Acute systolic heart failure (HCC)   Mitral regurgitation and aortic stenosis   CHF (congestive heart failure) (HCC)   Nonrheumatic aortic valve stenosis   CAD (coronary artery disease)   Signed, Rosaria Ferries , PA-C 9:04 AM 02/06/2018 Pager: [phone]  Personally seen and examined. Agree with above.  Long discussion today in collaboration with Dr. Angelena Form with structural heart team.  Lower extremity edema has been fairly persistent over the past several weeks.  NYHA class III currently.  Denies any chest discomfort.   Exam: Minimal inspiratory noise heard bilateral bases, jawline JVD, regular rate and rhythm, 3/6 systolic murmur heard best right upper sternal border.  4+ bilateral lower extremity pitting edema.  Assessment and plan:  Severe aortic stenosis low flow low gradient with dilated cardiomyopathy, possibly combined ischemic and nonischemic from aortic stenosis with chronic kidney disease creatinine approximately 1.8 at baseline, coronary artery disease with occluded right coronary artery left to right collaterals and 70% obtuse marginal branch smoker.  -We will consult the advanced heart failure team for possible tuneup prior to aortic valve procedure.  I am concerned about utilizing more Lasix without possible inotrope because hypotension could lead to severe cascade of events. -I will order a PICC line.  This  will allow [place] to check a co-ox. -Continue to monitor creatinine closely. - He quit tobacco use 15 days ago he states.  Mild wheezes heard on exam. -Structural heart team, Dr. Angelena Form and Dr. Roxy Manns with cardiothoracic surgery consultations. - Coronary artery disease reviewed as above. -Hyponatremia from heart failure  Overall prognosis over the next year with severe aortic stenosis and heart failure is poor.  Candee Furbish, MD

## 2018-02-06 NOTE — Consult Note (Signed)
TAVR Team Consultation:   Patient ID: Neal Dy. MRN: [id number]; DOB: Nov 29, 1940  Admit date: 02/01/2018 Date of Consult: 02/06/2018  Primary Care Provider: Jodelle Green, FNP Primary Cardiologist: Nelva Bush Primary Electrophysiologist:  None    Patient Profile:   Neal Dy. is a 77 y.o. male with a history of chronic systolic CHF, ischemic cardiomyopathy, stage 3 chronic kidney disease, HTN, tobacco abuse, severe mitral regurgitation, CAD and severe aortic stenosis who is being seen today for the evaluation of severe aortic valve stenosis at the request of Dr. Fletcher Anon.   History of Present Illness:   Mr. Brinegar is a  77 y.o. male with a history of iron deficiency anemia, chronic systolic CHF, ischemic cardiomyopathy, stage 3 chronic kidney disease, HTN, tobacco abuse, severe mitral regurgitation, CAD and severe aortic stenosis who is being seen today for the evaluation of severe aortic valve stenosis at the request of Dr. Fletcher Anon. He was seen in our [facility] office 01/25/18 by Ignacia Bayley, NP with c/o 3 week history of progressive dyspnea, weight gain and lower extremity edema. He is known to have stage 3 chronic kidney disease and had been followed by Nephrology. He began to notice lower blood pressures with orthostasis and feeling of dizziness in mid October 2019. His HCTZ was stopped in primary care. His symptoms progressed to dyspnea with mild exertion and lower extremity edema. Follow up in primary care on 01/23/18 and he was in sinus tachycardia with a new left bundle branch block. He was referred to our office in [place]. His weight was up 12 lbs from baseline. Chest x-ray showed pulmonary edema. He denied having ever been told that he had a cardiac murmur. Echo 01/25/18 with LVEF noted to around 20-25% with diffuse hypokinesis. The aortic valve leaflets are thickened with mild calcification. Leaflet excursion is reduced. Mean gradient 27 mmHg, peak gradient  47 mmHg, AVA 0.52 cm2. Dimensionless index 0.18. He was felt to have severe aortic stenosis with low gradient due to reduced LV systolic function. There is mild to moderate aortic valve regurgitation. There is moderate mitral regurgitation. He was started on Lasix. He was given Tolvaptan for hyponatremia. Right and left heart catheterization 01/12/2018 showed 2 vessel CAD with chronic occlusion of the ostial RCA and moderately severe proximal Circumflex stenosis. All vessel are heavily calcified. The LAD has no obstructive disease. The RCA fills from left to right collaterals. Mean gradient across the aortic valve of 24.7 mmHg. AVA 0.81 cm2. Cardiac output 4.41 with cardiac index 2.35. There was a large V-wave suggesting severe mitral regurgitation. The aortic root and ascending aorta are heavily calcified. He was transferred to [facility] on 01/24/2018 for consideration of 2 vessel CABG with surgical AVR vs TAVR.   He tells me today that he has had dizziness and dyspnea with exertion for the last 8 weeks. Also lower extremity edema. No improvement over past few weeks. No chest pain. He is retired Software engineer. He lives in a two story house in [place] with his wife. He has 2 sons. He sees a Pharmacist, community regularly. He has partial dentures.    Past Medical History:  Diagnosis Date  . Arthritis   . CAD (coronary artery disease)   . CKD (chronic kidney disease), stage III ([place])   . HTN (hypertension)   . Iron deficiency anemia   . Ischemic cardiomyopathy   . Mitral regurgitation   . Narcotic dependence, in remission ([place])   . Retinal vein occlusion   .  Severe aortic stenosis   . Systolic heart failure ([place]) 01/2018  . Tobacco abuse     Past Surgical History:  Procedure Laterality Date  . cyst chest  1994   left  . EYE SURGERY    . rectal fissure  1968  . RIGHT/LEFT HEART CATH AND CORONARY/GRAFT ANGIOGRAPHY N/A 02/04/2018   Procedure: RIGHT/LEFT HEART CATH AND CORONARY/GRAFT ANGIOGRAPHY;  Surgeon: Wellington Hampshire, MD;  Location: [facility];  Service: Cardiovascular;  Laterality: N/A;  . vro       Home Medications:  Prior to Admission medications   Medication Sig Start Date End Date Taking? Authorizing Provider  ALPRAZolam (XANAX) 0.25 MG tablet Take 1 tablet (0.25 mg total) by mouth 2 (two) times daily as needed for anxiety. 01/24/18  Yes Guse, Jacquelynn Cree, FNP  aspirin 81 MG chewable tablet Chew 1 tablet (81 mg total) by mouth daily. 01/18/2018  Yes Mayo, Pete Pelt, MD  aspirin-acetaminophen-caffeine [facility] MIGRAINE) [phone] MG per tablet Take 1 tablet by mouth every 6 (six) hours as needed for headache.    Yes [provider]  atorvastatin (LIPITOR) 80 MG tablet Take 1 tablet (80 mg total) by mouth daily at 6 PM. 01/30/2018  Yes Mayo, Pete Pelt, MD  furosemide (LASIX) 40 MG tablet Take 1 tablet (40 mg total) by mouth daily. Patient taking differently: Take 20 mg by mouth daily.  01/25/18 04/25/18 Yes Theora Gianotti, NP  gabapentin (NEURONTIN) 100 MG capsule Take 1 capsule (100 mg total) by mouth 3 (three) times daily. 11/06/17  Yes Rosemarie Ax, MD  Multiple Vitamins-Minerals (CENTRUM SILVER PO) Take 1 tablet by mouth daily.   Yes [provider]  traMADol (ULTRAM) 50 MG tablet Take 1 tablet (50 mg total) by mouth every 8 (eight) hours as needed. 09/27/17  Yes Lucille Passy, MD  traZODone (DESYREL) 50 MG tablet TAKE 0.5-1 TABLET EVERY NIGHT AT BEDTIME AS NEEDED FOR SLEEP 12/12/17  Yes Lucille Passy, MD    Inpatient Medications: Scheduled Meds: . aspirin EC  81 mg Oral Daily  . atorvastatin  80 mg Oral q1800  . budesonide (PULMICORT) nebulizer solution  0.25 mg Nebulization BID  . docusate sodium  100 mg Oral BID  . enoxaparin (LOVENOX) injection  30 mg Subcutaneous Q24H  . furosemide  40 mg Intravenous BID  . gabapentin  100 mg Oral TID  . ipratropium-albuterol  3 mL Nebulization TID  . nicotine  21 mg Transdermal Daily  . sodium chloride flush   3 mL Intravenous Q12H  . traZODone  100 mg Oral QHS   Continuous Infusions: . sodium chloride     PRN Meds: sodium chloride, acetaminophen, ALPRAZolam, bisacodyl, bisacodyl, guaiFENesin, HYDROcodone-acetaminophen, nitroGLYCERIN, ondansetron, ondansetron (ZOFRAN) IV, polyvinyl alcohol, senna-docusate, sodium chloride flush, traMADol  Allergies:    Allergies  Allergen Reactions  . Lisinopril     cough    Social History:   Social History   Socioeconomic History  . Marital status: Married    Spouse name: Not on file  . Number of children: 2  . Years of education: Not on file  . Highest education level: Not on file  Occupational History  . Occupation: Scientist, research (physical sciences): [employer]    Comment: RETIRED  Social Needs  . Financial resource strain: Not on file  . Food insecurity:    Worry: Not on file    Inability: Not on file  . Transportation needs:    Medical:  Not on file    Non-medical: Not on file  Tobacco Use  . Smoking status: Current Every Day Smoker    Packs/day: 0.50    Years: 30.00    Pack years: 15.00    Types: Cigarettes  . Smokeless tobacco: Never Used  . Tobacco comment: pt refused materials  Substance and Sexual Activity  . Alcohol use: Yes    Alcohol/week: 0.0 standard drinks    Comment: beer or wine occassionally  . Drug use: No  . Sexual activity: Yes  Lifestyle  . Physical activity:    Days per week: Not on file    Minutes per session: Not on file  . Stress: Not on file  Relationships  . Social connections:    Talks on phone: More than three times a week    Gets together: More than three times a week    Attends religious service: More than 4 times per year    Active member of club or organization: Yes    Attends meetings of clubs or organizations: More than 4 times per year    Relationship status: Married  . Intimate partner violence:    Fear of current or ex partner: No    Emotionally abused: No    Physically abused: No     Forced sexual activity: No  Other Topics Concern  . Not on file  Social History Narrative   Lives locally with wife Baker Janus Lemaster.   Does not routinely exercise.   Retired Software engineer (retail).   He does attend NA meetings.      Does have a living will- does not want prolonged life support if futile.    Family History:    Family History  Problem Relation Age of Onset  . Hyperlipidemia Father   . Hypertension Father   . Heart attack Father   . Stroke Father        died @ 61  . Heart disease Mother        died @ 27  . High Cholesterol Brother   . High blood pressure Brother   . Heart disease Sister 40       MI, CABG  . High Cholesterol Sister   . High blood pressure Sister   . Colon cancer Neg Hx   . Stomach cancer Neg Hx      ROS:  Please see the history of present illness.  All other ROS reviewed and negative.     Physical Exam/Data:   Vitals:   02/06/18 0155 02/06/18 0528 02/06/18 0855 02/06/18 1000  BP:  108/75  106/75  Pulse:  92  (!) 102  Temp:  98.1 F (36.7 C)    TempSrc:  Oral    SpO2: (!) 85% 97% 97%   Weight:  71.3 kg    Height:        Intake/Output Summary (Last 24 hours) at 02/06/2018 1132 Last data filed at 02/06/2018 1127 Gross per 24 hour  Intake 240 ml  Output 925 ml  Net -685 ml   Filed Weights   01/30/2018 1847 02/06/18 0528  Weight: 71.4 kg 71.3 kg   Body mass index is 25.36 kg/m.  General:  Well nourished, well developed, in no acute distress HEENT: normal Lymph: no adenopathy Neck: + JVD Endocrine:  No thryomegaly Vascular: No carotid bruits; FA pulses 2+ bilaterally without bruits  Cardiac:  normal S1, S2; RRR;  loud, harsh, late peaking systolic murmur at the RUSB Lungs:  Basilar crackles. no wheezing,  rhonchi or rales  Abd: soft, nontender, no hepatomegaly  Ext: 2+ bilateral lower extremity edema Musculoskeletal:  No deformities, BUE and BLE strength normal and equal Skin: warm and dry  Neuro:  CNs 2-12 intact, no focal  abnormalities noted Psych:  Normal affect   EKG:  The EKG was personally reviewed and demonstrates:  Sinus, rate 98 bpm. LBBB Telemetry:  Telemetry was personally reviewed and demonstrates:  sinus  Relevant CV Studies: Echo 01/25/18: Left ventricle: The cavity size was moderately dilated. Systolic   function was severely reduced. The estimated ejection fraction   was in the range of 20% to 25%. Diffuse hypokinesis. Hypokinesis   of the inferior myocardium. Hypokinesis of the inferolateral   myocardium. Hypokinesis of the lateral myocardium. Features are   consistent with a pseudonormal left ventricular filling pattern,   with concomitant abnormal relaxation and increased filling   pressure (grade 2 diastolic dysfunction). - Aortic valve: There was moderate stenosis by mean gradient and   peak velocity. Possibly severe by estimated AVA, may be   under-estimated secondary to severely reduced LV function. There   was mild to moderate regurgitation. Peak velocity (S): 342 cm/s.   Mean gradient (S): 27 mm Hg. Valve area (VTI): 0.52 cm^2. - Mitral valve: There was moderate regurgitation. Valve area by   continuity equation (using LVOT flow): 1.47 cm^2. - Left atrium: The atrium was moderately to severely dilated. - Pulmonary arteries: Systolic pressure was moderately elevated. PA   peak pressure: 57 mm Hg (S). - Inferior vena cava: The vessel was dilated. The respirophasic   diameter changes were blunted (< 50%), consistent with elevated   central venous pressure.  Impressions:  - Left pleural effusion noted, 5.7 cm.  ------------------------------------------------------------------- Study data:  No prior study was available for comparison.  Study status:  Routine.  Procedure:  Transthoracic echocardiography. Image quality was good.  Study completion:  There were no complications.          Transthoracic echocardiography.  M-mode, complete 2D, spectral Doppler, and color Doppler.   Birthdate: Patient birthdate: May 13, 1940.  Age:  Patient is 77 yr old.  Sex: Gender: male.    BMI: 23.7 kg/m^2.  Blood pressure:     116/68 Patient status:  Outpatient.  Study date:  Study date: 01/25/2018. Study time: 04:18 PM.  -------------------------------------------------------------------  ------------------------------------------------------------------- Left ventricle:  The cavity size was moderately dilated. Systolic function was severely reduced. The estimated ejection fraction was in the range of 20% to 25%. Diffuse hypokinesis.  Regional wall motion abnormalities:   Hypokinesis of the inferior myocardium. Hypokinesis of the inferolateral myocardium.  Hypokinesis of the lateral myocardium. Features are consistent with a pseudonormal left ventricular filling pattern, with concomitant abnormal relaxation and increased filling pressure (grade 2 diastolic dysfunction).  ------------------------------------------------------------------- Aortic valve:  Poorly visualized.  Severely calcified leaflets. Cusp separation was normal.  Doppler:  There was moderate stenosis by mean gradient and peak velocity. Possibly severe by estimated AVA, may be under-estimated secondary to severely reduced LV function. There was mild to moderate regurgitation.    VTI ratio of LVOT to aortic valve: 0.18. Valve area (VTI): 0.52 cm^2. Indexed valve area (VTI): 0.28 cm^2/m^2. Mean velocity ratio of LVOT to aortic valve: 0.22. Valve area (Vmean): 0.64 cm^2. Indexed valve area (Vmean): 0.34 cm^2/m^2.    Mean gradient (S): 27 mm Hg. Peak gradient (S): 47 mm Hg.  ------------------------------------------------------------------- Mitral valve:   Structurally normal valve.   Leaflet separation was normal.  Doppler:  Transvalvular velocity was  within the normal range. There was no evidence for stenosis. There was moderate regurgitation.    Valve area by pressure half-time: 5.12 cm^2. Indexed valve  area by pressure half-time: 2.71 cm^2/m^2. Valve area by continuity equation (using LVOT flow): 1.47 cm^2. Indexed valve area by continuity equation (using LVOT flow): 0.78 cm^2/m^2. Mean gradient (D): 3 mm Hg. Peak gradient (D): 6 mm Hg.  ------------------------------------------------------------------- Left atrium:  The atrium was moderately to severely dilated.   ------------------------------------------------------------------- Right ventricle:  The cavity size was normal. Wall thickness was normal. Systolic function was normal.  ------------------------------------------------------------------- Pulmonic valve:    Structurally normal valve.   Cusp separation was normal.  Doppler:  Transvalvular velocity was within the normal range. There was no regurgitation.  ------------------------------------------------------------------- Tricuspid valve:   Structurally normal valve.   Leaflet separation was normal.  Doppler:  Transvalvular velocity was within the normal range. There was no regurgitation.  ------------------------------------------------------------------- Pulmonary artery:   Systolic pressure was moderately elevated.  ------------------------------------------------------------------- Right atrium:  The atrium was normal in size.  ------------------------------------------------------------------- Pericardium:  The pericardium was normal in appearance.  ------------------------------------------------------------------- Systemic veins: Inferior vena cava: The vessel was dilated. The respirophasic diameter changes were blunted (< 50%), consistent with elevated central venous pressure.  ------------------------------------------------------------------- Measurements   Left ventricle                           Value          Reference  LV ID, ED, PLAX chordal          (H)     63    mm       43 - 52  LV ID, ES, PLAX chordal          (H)     59    mm        23 - 38  LV fx shortening, PLAX chordal   (L)     6     %        >=29  LV PW thickness, ED                      8     mm       ----------  IVS/LV PW ratio, ED              (H)     1.5            <=1.3  Stroke volume, 2D                        41    ml       ----------  Stroke volume/bsa, 2D                    22    ml/m^2   ----------  LV ejection fraction, 1-p A4C            28    %        ----------  LV end-diastolic volume, 2-p             197   ml       ----------  LV end-systolic volume, 2-p              131   ml       ----------  LV ejection fraction, 2-p  34    %        ----------  Stroke volume, 2-p                       66    ml       ----------  LV end-diastolic volume/bsa, 2-p         104   ml/m^2   ----------  LV end-systolic volume/bsa, 2-p          69    ml/m^2   ----------  Stroke volume/bsa, 2-p                   35    ml/m^2   ----------  LV e&', lateral                           7.72  cm/s     ----------  LV E/e&', lateral                         16.06          ----------  LV e&', medial                            4.79  cm/s     ----------  LV E/e&', medial                          25.89          ----------  LV e&', average                           6.26  cm/s     ----------  LV E/e&', average                         19.82          ----------    Ventricular septum                       Value          Reference  IVS thickness, ED                        12    mm       ----------    LVOT                                     Value          Reference  LVOT ID, S                               19    mm       ----------  LVOT area                                2.84  cm^2     ----------  LVOT ID  19    mm       ----------  LVOT mean velocity, S                    54.2  cm/s     ----------  LVOT VTI, S                              14.4  cm       ----------  Stroke volume (SV), LVOT DP              40.8  ml       ----------  Stroke  index (SV/bsa), LVOT DP           21.6  ml/m^2   ----------    Aortic valve                             Value          Reference  Aortic valve peak velocity, S            342   cm/s     ----------  Aortic valve mean velocity, S            241   cm/s     ----------  Aortic valve VTI, S                      78.7  cm       ----------  Aortic mean gradient, S                  27    mm Hg    ----------  Aortic peak gradient, S                  47    mm Hg    ----------  VTI ratio, LVOT/AV                       0.18           ----------  Aortic valve area, VTI                   0.52  cm^2     ----------  Aortic valve area/bsa, VTI               0.28  cm^2/m^2 ----------  Velocity ratio, mean, LVOT/AV            0.22           ----------  Aortic valve area, mean velocity         0.64  cm^2     ----------  Aortic valve area/bsa, mean              0.34  cm^2/m^2 ----------  velocity  Aortic regurg pressure half-time         313   ms       ----------    Aorta                                    Value          Reference  Aortic root ID, ED  30    mm       ----------  Ascending aorta ID, A-P, S               33    mm       ----------    Left atrium                              Value          Reference  LA ID, A-P, ES                           47    mm       ----------  LA ID/bsa, A-P                   (H)     2.49  cm/m^2   <=2.2  LA volume, S                             119   ml       ----------  LA volume/bsa, S                         63    ml/m^2   ----------  LA volume, ES, 1-p A4C                   81.7  ml       ----------  LA volume/bsa, ES, 1-p A4C               43.3  ml/m^2   ----------  LA volume, ES, 1-p A2C                   157   ml       ----------  LA volume/bsa, ES, 1-p A2C               83.2  ml/m^2   ----------    Mitral valve                             Value          Reference  Mitral E-wave peak velocity              124   cm/s     ----------  Mitral  A-wave peak velocity              100   cm/s     ----------  Mitral mean velocity, D                  81.4  cm/s     ----------  Mitral deceleration time         (L)     148   ms       150 - 230  Mitral pressure half-time                43    ms       ----------  Mitral mean gradient, D                  3     mm Hg    ----------  Mitral peak gradient, D  6     mm Hg    ----------  Mitral E/A ratio, peak                   1.2            ----------  Mitral valve area, PHT, DP               5.12  cm^2     ----------  Mitral valve area/bsa, PHT, DP           2.71  cm^2/m^2 ----------  Mitral valve area, LVOT                  1.47  cm^2     ----------  continuity  Mitral valve area/bsa, LVOT              0.78  cm^2/m^2 ----------  continuity  Mitral annulus VTI, D                    27.9  cm       ----------    Pulmonary arteries                       Value          Reference  PA pressure, S, DP               (H)     57    mm Hg    <=30    Tricuspid valve                          Value          Reference  Tricuspid regurg peak velocity           303   cm/s     ----------  Tricuspid peak RV-RA gradient            37    mm Hg    ----------    Right atrium                             Value          Reference  RA ID, S-I, ES, A4C              (H)     57.6  mm       34 - 49  RA area, ES, A4C                 (H)     21.9  cm^2     8.3 - 19.5  RA volume, ES, A/L                       64    ml       ----------  RA volume/bsa, ES, A/L                   33.9  ml/m^2   ----------    Right ventricle                          Value          Reference  TAPSE  18.4  mm       ----------  RV s&', lateral, S                        8.16  cm/s     ----------    Pulmonic valve                           Value          Reference  Pulmonic valve peak velocity, S          74.2  cm/s     ----------  Pulmonic regurg velocity, ED             178   cm/s      ----------  Pulmonic regurg gradient, ED             13    mm Hg    ----------  Cardiac cath 01/17/2018:  Ost RCA to Prox RCA lesion is 100% stenosed.  Prox Cx lesion is 70% stenosed.  Mid Cx lesion is 30% stenosed.   1.  Significant two-vessel coronary artery disease with heavily calcified vessels.  The RCA is occluded at the ostium with reasonable left-to-right collaterals.  The left circumflex has 70% proximal stenosis.  The LAD has mild nonobstructive disease. 2.  Severe aortic stenosis with a mean gradient of 24.7 mmHg with a calculated valve area of 0.81.  The valve leaflets appear heavily calcified on fluoroscopy with significantly restricted opening. 3.  Severe aortic root and proximal ascending aorta calcifications. 4.  Right heart catheterization showed moderately to severely elevated filling pressures, moderate pulmonary hypertension and mildly reduced cardiac output.  Pulmonary capillary wedge pressure was 29 mmHg with PA pressure of 55/32 mmHg.  Cardiac output was 4.42 with a cardiac index of 2.35. 5.  Difficulty filling the coronary arteries with angiography due to calcifications and aortic stenosis.  A 6 [place] guide could not be advanced beyond the elbow area due to suspected stenosis.  Recommendations: The patient continues to be volume overloaded.  Resume intravenous diuresis tomorrow. Transferred to [facility] for evaluation of AVR plus two-vessel CABG versus TAVR.  The patient is overall high risk due to his comorbidities, low EF and underlying chronic kidney disease.  Diagnostic Diagram      AO Systolic Cath Pressure AO Diastolic Cath Pressure AO Mean Cath Pressure LV Systolic Cath Pressure LV End Diastolic PA Systolic Cath Pressure PA Diastolic Cath Pressure PA Mean Cath Pressure RV Systolic Cath Pressure RV Diastolic Cath Pressure RV End Diastolic AO O2 Sat PA O2 Sat AO O2 Sat  - - - - - - - - 52 mmHg 13 mmHg 19 mmHg - - -  - - - - - 55 mmHg 32 mmHg 42 mmHg - - - - -  -  91 54 mmHg 57 mmHg - - - - - - - - - - -  - - - 110 mmHg 24 mmHg - - - - - - - - -  - - - 124 mmHg 24 mmHg - - - - - - - - -  - - - 124 mmHg 28 mmHg - - - - - - - - -  91 56 mmHg 71 mmHg - - - - - - - - - - -  - - - - - - - - - - - - PA -  - - - - - - - - - - -  95.9 % -      Laboratory Data:  Chemistry Recent Labs  Lab 02/04/18 0904 02/04/18 1603 01/18/2018 0007 02/06/18 0126  NA 124* 124* 126* 128*  K 5.5*  --  4.1 4.0  CL 81*  --  81* 83*  CO2 28  --  31 31  GLUCOSE 161*  --  134* 129*  BUN 43*  --  41* 39*  CREATININE 2.05*  --  1.90* 2.07*  CALCIUM 9.7  --  9.1 9.5  GFRNONAA 30*  --  32* 29*  GFRAA 34*  --  38* 34*  ANIONGAP 15  --  14 14    Recent Labs  Lab 02/06/18 0126  PROT 6.8  ALBUMIN 3.5  AST 39  ALT 38  ALKPHOS 63  BILITOT 1.0   Hematology Recent Labs  Lab 01/31/18 1908 02/06/18 0636  WBC 5.1 4.9  RBC 3.94* 3.69*  HGB 10.4* 9.4*  HCT 33.3* 30.5*  MCV 84.5 82.7  MCH 26.4 25.5*  MCHC 31.2 30.8  RDW 16.5* 16.0*  PLT 202 209   Cardiac Enzymes Recent Labs  Lab 01/12/2018 1932 02/06/18 0126 02/06/18 0636  TROPONINI 0.10* 0.09* 0.08*   No results for input(s): TROPIPOC in the last 168 hours.  BNP Recent Labs  Lab 02/06/18 0126  BNP >4,500.0*    DDimer No results for input(s): DDIMER in the last 168 hours.  Radiology/Studies:  Dg Chest 2 View  Result Date: 02/06/2018 CLINICAL DATA:  Shortness of breath and leg swelling for 2 weeks EXAM: CHEST - 2 VIEW COMPARISON:  01/23/2018 FINDINGS: Cardiac shadow remains enlarged. The degree of vascular congestion has improved slightly in the interval. Bilateral pleural effusions with underlying atelectatic changes are again seen but improved on the right. No bony abnormality is seen. IMPRESSION: Improved effusion on the right although bilateral small effusions remain. Underlying atelectatic changes are noted. Electronically Signed   By: Inez Catalina M.D.   On: 02/06/2018 08:51   [place] Ekg Site  Rite  Result Date: 02/06/2018 If Site Rite image not attached, placement could not be confirmed due to current cardiac rhythm.   Assessment and Plan:   1. Severe aortic valve stenosis 2. Ischemic cardiomyopathy 3. Acute on chronic systolic CHF 4. Severe mitral regurgitation  He has severe, stage D aortic valve stenosis. I have personally reviewed the echo images. The aortic valve is thickened with mild calcification with limited leaflet mobility. His mean gradient is only moderately elevated due to low LV systolic function. He has chronic occlusion of the RCA and moderate disease in the Circumflex artery. We will consider CABG with surgical AVR vs TAVR. He is a high risk candidate for open heart surgery given his age and comorbidities including advance age, chronic kidney disease and calcified aorta. I think he would benefit from AVR from the standpoint of his heart failure. He will be considered for TAVR. Dr. Roxy Manns with CT surgery will see him later today to review options. It would be anticipated that he may see improvement in his heart failure and possibly his LV systolic function with AVR. His mitral regurgitation may also improve with treatment of his aortic valve. If we elect to proceed with TAVR, I would favor medical management of his CAD.    I have reviewed the natural history of aortic stenosis with the patient and their family members  who are present today. We have discussed the limitations of medical therapy and the poor prognosis associated with symptomatic aortic stenosis. We  have reviewed potential treatment options, including palliative medical therapy, conventional surgical aortic valve replacement, and transcatheter aortic valve replacement. We discussed treatment options in the context of the patient's specific comorbid medical conditions.   STS Risk Score:  Risk of Mortality: 6.714% Renal Failure: 9.647% Permanent Stroke: 2.511% Prolonged Ventilation: 28.338% DSW Infection:  0.202% Reoperation: 9.945% Morbidity or Mortality; 42. 123% Short Length of Stay: 10.819% Long Length of Stay: 20.986%   He would like to proceed with planning for TAVR. As above, we will have Dr. Roxy Manns also see him to discuss possible CABG and surgical AVR. I will ask the Advanced Heart Failure team to see him to help optimize his volume status in setting of acute on chronic kidney disease. We will go ahead and arrange his PFTs, carotid artery dopplers and PT assessment. His CT scans will have to planned around the stability of his renal function. I have reviewed the TAVR procedure in detail including risk of the procedure.    For questions or updates, please contact [place] Please consult [email or web address] for contact info under     Signed, Lauree Chandler, MD  02/06/2018 11:32 AM

## 2018-02-06 NOTE — Progress Notes (Signed)
Peripherally Inserted Central Catheter/Midline Placement  The IV Nurse has discussed with the patient and/or persons authorized to consent for the patient, the purpose of this procedure and the potential benefits and risks involved with this procedure.  The benefits include less needle sticks, lab draws from the catheter, and the patient may be discharged home with the catheter. Risks include, but not limited to, infection, bleeding, blood clot (thrombus formation), and puncture of an artery; nerve damage and irregular heartbeat and possibility to perform a PICC exchange if needed/ordered by physician.  Alternatives to this procedure were also discussed.  Bard Power PICC patient education guide, fact sheet on infection prevention and patient information card has been provided to patient /or left at bedside.  Placed by Christella Noa, RN   PICC/Midline Placement Documentation  PICC Double Lumen 33/83/29 PICC Right Basilic 42 cm 0 cm (Active)  Indication for Insertion or Continuance of Line Chronic illness with exacerbations (CF, Sickle Cell, etc.) 02/06/2018 12:27 PM  Exposed Catheter (cm) 0 cm 02/06/2018 12:27 PM  Site Assessment Clean;Dry;Intact 02/06/2018 12:27 PM  Lumen #1 Status Flushed;Saline locked;Blood return noted 02/06/2018 12:27 PM  Lumen #2 Status Flushed;Saline locked;Blood return noted 02/06/2018 12:27 PM  Dressing Type Transparent 02/06/2018 12:27 PM  Dressing Status Clean;Dry;Intact 02/06/2018 12:27 PM  Dressing Intervention New dressing 02/06/2018 12:27 PM  Dressing Change Due 03/15/2018 02/06/2018 12:27 PM       Meital Riehl, Nicolette Bang 02/06/2018, 12:28 PM

## 2018-02-07 ENCOUNTER — Other Ambulatory Visit: Payer: Self-pay

## 2018-02-07 ENCOUNTER — Inpatient Hospital Stay (HOSPITAL_COMMUNITY): Payer: Medicare Other

## 2018-02-07 ENCOUNTER — Ambulatory Visit: Payer: Medicare Other | Admitting: Family

## 2018-02-07 ENCOUNTER — Inpatient Hospital Stay (HOSPITAL_COMMUNITY)
Admission: AD | Admit: 2018-02-07 | Discharge: 2018-02-07 | Disposition: A | Discharge status: Home / Self Care | Payer: Medicare Other | Source: Other Acute Inpatient Hospital | Attending: Cardiology | Admitting: Cardiology

## 2018-02-07 DIAGNOSIS — Z952 Presence of prosthetic heart valve: Secondary | ICD-10-CM

## 2018-02-07 LAB — CBC WITH DIFFERENTIAL/PLATELET
Abs Immature Granulocytes: 0.02 10*3/uL (ref 0.00–0.07)
BASOS ABS: 0 10*3/uL (ref 0.0–0.1)
BASOS PCT: 1 %
EOS PCT: 6 %
Eosinophils Absolute: 0.3 10*3/uL (ref 0.0–0.5)
HCT: 32.6 % — ABNORMAL LOW (ref 39.0–52.0)
Hemoglobin: 10.2 g/dL — ABNORMAL LOW (ref 13.0–17.0)
Immature Granulocytes: 0 %
LYMPHS PCT: 19 %
Lymphs Abs: 1 10*3/uL (ref 0.7–4.0)
MCH: 26.2 pg (ref 26.0–34.0)
MCHC: 31.3 g/dL (ref 30.0–36.0)
MCV: 83.8 fL (ref 80.0–100.0)
MONO ABS: 0.7 10*3/uL (ref 0.1–1.0)
Monocytes Relative: 13 %
NEUTROS ABS: 3.3 10*3/uL (ref 1.7–7.7)
NRBC: 0 % (ref 0.0–0.2)
Neutrophils Relative %: 61 %
PLATELETS: 200 10*3/uL (ref 150–400)
RBC: 3.89 MIL/uL — AB (ref 4.22–5.81)
RDW: 16.2 % — ABNORMAL HIGH (ref 11.5–15.5)
WBC: 5.3 10*3/uL (ref 4.0–10.5)

## 2018-02-07 LAB — PULMONARY FUNCTION TEST
DL/VA % PRED: 99 %
DL/VA: 4.55 ml/min/mmHg/L
DLCO COR: 14.18 ml/min/mmHg
DLCO UNC % PRED: 38 %
DLCO UNC: 12.04 ml/min/mmHg
DLCO cor % pred: 44 %
FEF 25-75 PRE: 0.46 L/s
FEF 25-75 Post: 0.6 L/sec
FEF2575-%CHANGE-POST: 29 %
FEF2575-%PRED-POST: 28 %
FEF2575-%PRED-PRE: 22 %
FEV1-%Change-Post: 8 %
FEV1-%PRED-POST: 35 %
FEV1-%PRED-PRE: 32 %
FEV1-PRE: 0.94 L
FEV1-Post: 1.02 L
FEV1FVC-%CHANGE-POST: -5 %
FEV1FVC-%PRED-PRE: 87 %
FEV6-%CHANGE-POST: 14 %
FEV6-%PRED-POST: 43 %
FEV6-%Pred-Pre: 38 %
FEV6-Post: 1.67 L
FEV6-Pre: 1.46 L
FEV6FVC-%CHANGE-POST: -2 %
FEV6FVC-%Pred-Post: 104 %
FEV6FVC-%Pred-Pre: 106 %
FVC-%Change-Post: 15 %
FVC-%Pred-Post: 41 %
FVC-%Pred-Pre: 36 %
FVC-Post: 1.71 L
FVC-Pre: 1.48 L
POST FEV1/FVC RATIO: 60 %
PRE FEV1/FVC RATIO: 63 %
Post FEV6/FVC ratio: 97 %
Pre FEV6/FVC Ratio: 99 %
RV % PRED: 80 %
RV: 2.07 L
TLC % pred: 54 %
TLC: 3.8 L

## 2018-02-07 LAB — COOXEMETRY PANEL
Carboxyhemoglobin: 2.2 % — ABNORMAL HIGH (ref 0.5–1.5)
Methemoglobin: 1.6 % — ABNORMAL HIGH (ref 0.0–1.5)
O2 Saturation: 53.8 %
TOTAL HEMOGLOBIN: 10.2 g/dL — AB (ref 12.0–16.0)

## 2018-02-07 LAB — BASIC METABOLIC PANEL
Anion gap: 14 (ref 5–15)
BUN: 37 mg/dL — AB (ref 8–23)
CALCIUM: 9.5 mg/dL (ref 8.9–10.3)
CO2: 33 mmol/L — ABNORMAL HIGH (ref 22–32)
CREATININE: 2.11 mg/dL — AB (ref 0.61–1.24)
Chloride: 83 mmol/L — ABNORMAL LOW (ref 98–111)
GFR calc non Af Amer: 29 mL/min — ABNORMAL LOW (ref >60)
GFR, EST AFRICAN AMERICAN: 34 mL/min — AB (ref >60)
Glucose, Bld: 130 mg/dL — ABNORMAL HIGH (ref 70–99)
Potassium: 3.8 mmol/L (ref 3.5–5.1)
SODIUM: 130 mmol/L — AB (ref 135–145)

## 2018-02-07 LAB — TSH: TSH: 2.478 u[IU]/mL (ref 0.350–4.500)

## 2018-02-07 LAB — GLUCOSE, CAPILLARY: GLUCOSE-CAPILLARY: 93 mg/dL (ref 70–99)

## 2018-02-07 MED ORDER — SENNOSIDES-DOCUSATE SODIUM 8.6-50 MG PO TABS
1.0000 | ORAL_TABLET | Freq: Two times a day (BID) | ORAL | Status: DC
  Administered 2018-02-07 – 2018-02-08 (×2): 1 via ORAL
  Filled 2018-02-07 (×2): qty 1

## 2018-02-07 MED ORDER — ALBUTEROL SULFATE (2.5 MG/3ML) 0.083% IN NEBU
2.5000 mg | INHALATION_SOLUTION | Freq: Once | RESPIRATORY_TRACT | Status: AC
  Administered 2018-02-07: 2.5 mg via RESPIRATORY_TRACT

## 2018-02-07 MED ORDER — FUROSEMIDE 10 MG/ML IJ SOLN
80.0000 mg | INTRAMUSCULAR | Status: DC
  Administered 2018-02-07 – 2018-02-09 (×6): 80 mg via INTRAVENOUS
  Filled 2018-02-07 (×6): qty 8

## 2018-02-07 MED ORDER — SENNOSIDES-DOCUSATE SODIUM 8.6-50 MG PO TABS: 1.0000 | ORAL_TABLET | Freq: Every day | ORAL | Status: DC

## 2018-02-07 MED ORDER — POTASSIUM CHLORIDE CRYS ER 20 MEQ PO TBCR
40.0000 meq | EXTENDED_RELEASE_TABLET | Freq: Once | ORAL | Status: AC
  Administered 2018-02-07: 40 meq via ORAL
  Filled 2018-02-07: qty 2

## 2018-02-07 NOTE — Progress Notes (Addendum)
Advanced Heart Failure Rounding Note  PCP-Cardiologist: Kathlyn Sacramento, MD   Subjective:    Yesterday started on milrinone 0.25 mcg and diuresed with a total of 200 mg IV lasix. Negative 1.9 liters.   Creatinine 2.1>2.1  Feeling a little better. Having an ongoing cough at night. Denies SOB.    Objective:   Weight Range: 70.7 kg Body mass index is 25.15 kg/m.   Vital Signs:   Temp:  [97.9 F (36.6 C)-98.5 F (36.9 C)] 98.4 F (36.9 C) (11/27 0519) Pulse Rate:  [84-108] 84 (11/27 0519) BP: (92-106)/(67-75) 95/67 (11/27 0519) SpO2:  [91 %-97 %] 96 % (11/27 0519) Weight:  [70.7 kg] 70.7 kg (11/27 0519) Last BM Date: 02/04/18  Weight change: Filed Weights   01/27/2018 1847 02/06/18 0528 02/07/18 0519  Weight: 71.4 kg 71.3 kg 70.7 kg    Intake/Output:   Intake/Output Summary (Last 24 hours) at 02/07/2018 0826 Last data filed at 02/07/2018 0750 Gross per 24 hour  Intake 607.32 ml  Output 2625 ml  Net -2017.68 ml      Physical Exam    General:  No resp difficulty. Sitting on the side of the bed.  HEENT: Normal Neck: Supple. JVP to jaw . Carotids 2+ bilat; no bruits. No lymphadenopathy or thyromegaly appreciated. Cor: PMI nondisplaced. Regular rate & rhythm. No rubs, + S3  RUSB 2/6. Lungs: Clear Abdomen: Soft, nontender, nondistended. No hepatosplenomegaly. No bruits or masses. Good bowel sounds. Extremities: No cyanosis, clubbing, rash, R and LLE 2+ edema. RUE PICC Neuro: Alert & orientedx3, cranial nerves grossly intact. moves all 4 extremities w/o difficulty. Affect pleasant   Telemetry  Sinus Tach 100s   EKG    N/a   Labs    CBC Recent Labs    02/06/18 0636 02/07/18 0424  WBC 4.9 5.3  NEUTROABS 3.2 3.3  HGB 9.4* 10.2*  HCT 30.5* 32.6*  MCV 82.7 83.8  PLT 209 160   Basic Metabolic Panel Recent Labs    02/06/18 0126 02/07/18 0424  NA 128* 130*  K 4.0 3.8  CL 83* 83*  CO2 31 33*  GLUCOSE 129* 130*  BUN 39* 37*  CREATININE 2.07*  2.11*  CALCIUM 9.5 9.5   Liver Function Tests Recent Labs    02/06/18 0126  AST 39  ALT 38  ALKPHOS 63  BILITOT 1.0  PROT 6.8  ALBUMIN 3.5   No results for input(s): LIPASE, AMYLASE in the last 72 hours. Cardiac Enzymes Recent Labs    01/16/2018 1932 02/06/18 0126 02/06/18 0636  TROPONINI 0.10* 0.09* 0.08*    BNP: BNP (last 3 results) Recent Labs    02/06/18 0126  BNP >4,500.0*    ProBNP (last 3 results) No results for input(s): PROBNP in the last 8760 hours.   D-Dimer No results for input(s): DDIMER in the last 72 hours. Hemoglobin A1C Recent Labs    02/06/18 0126  HGBA1C 5.8*   Fasting Lipid Panel No results for input(s): CHOL, HDL, LDLCALC, TRIG, CHOLHDL, LDLDIRECT in the last 72 hours. Thyroid Function Tests No results for input(s): TSH, T4TOTAL, T3FREE, THYROIDAB in the last 72 hours.  Invalid input(s): FREET3  Other results:   Imaging    Dg Chest 2 View  Result Date: 02/06/2018 CLINICAL DATA:  Shortness of breath and leg swelling for 2 weeks EXAM: CHEST - 2 VIEW COMPARISON:  01/23/2018 FINDINGS: Cardiac shadow remains enlarged. The degree of vascular congestion has improved slightly in the interval. Bilateral pleural effusions with underlying atelectatic  changes are again seen but improved on the right. No bony abnormality is seen. IMPRESSION: Improved effusion on the right although bilateral small effusions remain. Underlying atelectatic changes are noted. Electronically Signed   By: Inez Catalina M.D.   On: 02/06/2018 08:51   [place] Ekg Site Rite  Result Date: 02/06/2018 If Site Rite image not attached, placement could not be confirmed due to current cardiac rhythm.     Medications:     Scheduled Medications: . aspirin EC  81 mg Oral Daily  . atorvastatin  80 mg Oral q1800  . budesonide (PULMICORT) nebulizer solution  0.25 mg Nebulization BID  . docusate sodium  100 mg Oral BID  . enoxaparin (LOVENOX) injection  30 mg Subcutaneous Q24H    . furosemide  80 mg Intravenous BID  . gabapentin  100 mg Oral TID  . nicotine  21 mg Transdermal Daily  . sodium chloride flush  10-40 mL Intracatheter Q12H  . traZODone  100 mg Oral QHS     Infusions: . ferumoxytol 510 mg (02/06/18 1622)  . milrinone 0.125 mcg/kg/min (02/07/18 0200)     PRN Medications:  acetaminophen, ALPRAZolam, bisacodyl, bisacodyl, guaiFENesin, HYDROcodone-acetaminophen, ipratropium-albuterol, nitroGLYCERIN, ondansetron, ondansetron (ZOFRAN) IV, polyvinyl alcohol, senna-docusate, sodium chloride flush, traMADol    Patient Profile   Mr Nick is a 77 year old with a history of recently diagnosed systolic heart failure earlier this month, HTN, iron deficiency,CKD III and tobacco abuse.    Admitted from [facility] for CT surgery and structural heart consultation this is complicated by marked volume overload and worsening renal function.   Assessment/Plan  1. Acute Systolic Heart Failure , ICM/NICM with valvular disease .  ECHO 01/2013 EF ~30%. Severe AS . Suspect low output heart failure with + S3 and tachycardia. Check TSH.  CO-OX pending and CVP set up pending.  For now continue milrinone 0.125 mcg. May need to increase to 0.25 mcg if CO-OX low.  Remains volume overloaded. Increase lasix to 80 mg IV lasix three times a day.   No BB with suspected low output.  No Arb, dig, spiro with elevated creatinine.  Continue compression stockings.  2. CAD,  LHC 01/31/2018 with Ost RCA to Prox RCA lesion 100% stenosed, Prox CX lesion 70%, and mid Cx lesion 30% stenosed. CT surgery consulted.  No chest pain.  -. Continue aspirin and statin.  3. Severe Aortic Stenosis  ECHO 01/25/2018 Dr Angelena Form and Dr Roxy Manns following for possible TAVR.  PFTs obtained today.    4. CKD Stage III Creatinine 1.9>2.1  - Continue milrinone as noted above.  Follow BMET daily. 5. MR  Hopefully will improve with diuresis.  6. Anemia  Iron sats 9% on 01/23/2018 .  - Received feraheme  11/26 Hgb 10.4. No overt signs of bleeding. Will check anemia panel  7. Hyponatremia.   Sodium 130. May need tolvaptan.   CO-OX and CVP pending.   Medication concerns reviewed with patient and pharmacy team. Barriers identified:n/a   Length of Stay: 2  Amy Clegg, NP  02/07/2018, 8:26 AM  Advanced Heart Failure Team Pager [phone] (M-F; 7a - 4p)  Please contact [facility] for night-coverage after hours (4p -7a ) and weekends on [email or web address]  Patient seen with NP, agree with the above note.   He diuresed well yesterday on Lasix gtt and milrinone 0.125.  Co-ox 54% this morning.  Still waiting for CVP to be set up.  Creatinine stable 2.1.  SBP 90s-100s, NSR in 90s on telemetry.  Short  of breath walking around the room.   On exam, JVP 16 cm, regular S1S2 with 3/6 SEM RUSB with obscured S2, 1+ edema to knees.  Mild crackles at bases.   1. Aortic stenosis: Low gradient severe AS by cath and echo.  With low EF, renal dysfunction, and suspected COPD, I think that the best course will be TAVR rather than CABG/AVR, which would be high risk.  We need to improve volume status and renal function prior to procedure.  He has been seen by Drs Angelena Form and Roxy Manns.  Will need to see some improvement in creatinine prior to obtaining his pre-TAVR CTAs.  2. Mitral regurgitation: Moderate by 11/14 echo, hopefully will improve with TAVR.  3. CAD: Cath 11/19 with chronically occluded RCA and 70% LCx (LCx is not a critical lesion). No chest pain.  Would aim for medical management at this time.  - Continue ASA 81 and statin. 4. Acute on chronic systolic CHF: Echo (29/19) with EF 20-25% in setting of low gradient severe AS.  Suspect mixed cardiomyopathy from CAD (occluded RCA) and severe AS.  Resolution of AS may help LV systolic function.  Milrinone 0.125 begun with low output, co-ox 54% today.  He remains volume overloaded by exam but is diuresing well on IV Lasix.  - Still need to set up CVP.  - Increase  milrinone to 0.25, follow co-ox daily.  - Continue Lasix 80 mg IV every 8 hrs.  5. AKI on CKD 3: Creatinine up to 2.1, baseline may be around 1.5-1.6.  Milrinone started to maintain CO and will be increased today.  Hopefully, renal function will improve with milrinone and diuresis.  He will need pre-TAVR CTAs, will try to optimize renal function before ordering these studies.  6. Fe deficiency anemia: Got feraheme.  Hgb stable 10.2.  7. Hyponatremia: Fluid restrict.   Loralie Champagne 02/07/2018 10:18 AM

## 2018-02-07 NOTE — Progress Notes (Signed)
Bilateral carotid duplex completed - Preliminary results- Bilateral - 40% to 59% ICA stenosis.Vertebral artery flow is antegrade. [place] Tippi Mccrae,RVS 02/07/2018 4:04 PM

## 2018-02-07 NOTE — Evaluation (Signed)
Physical Therapy Evaluation Patient Details Name: George Washington. MRN: [id number] DOB: May 22, 1940 Today's Date: 02/07/2018   History of Present Illness  Pt is a 77 y.o. male admitted 01/16/2018 with newly diagnoses HF and exacerbation. S/p cardiac cath showing significant two-vessel CAD. Worked up for AVR/CABG vs. TAVR. PMH includes HF, HTN, CKDIII, tobacco use, arthritis.    Clinical Impression  Pt seen for pre-TAVR evaluation (see next progress note). PTA, pt indep and lives with wife who works as Therapist, sports on weekends; pt has had increasing difficult with activity secondary to decreased activity tolerance. Today, pt ambulatory with supervision; limited by c/o lightheadedness and soft BP (see values below; RN notified). Pt would benefit from continued acute PT services to maximize functional mobility and independence prior to d/c home.  Orthostatic BPs  Supine 94/69  Sitting 88/68  Standing 104/60  Ambulating (pt symptomatic) 70/37  Return to supine 96/72      Follow Up Recommendations No PT follow up;Supervision - Intermittent    Equipment Recommendations  None recommended by PT    Recommendations for Other Services       Precautions / Restrictions Precautions Precaution Comments: Soft BP Restrictions Weight Bearing Restrictions: No      Mobility  Bed Mobility Overal bed mobility: Independent                Transfers Overall transfer level: Independent Equipment used: None             General transfer comment: C/o lightheadedness upon sitting  Ambulation/Gait Ambulation/Gait assistance: Supervision Gait Distance (Feet): 200 Feet Assistive device: None;IV Pole Gait Pattern/deviations: Step-through pattern;Decreased stride length Gait velocity: Decreased Gait velocity interpretation: 1.31 - 2.62 ft/sec, indicative of limited community ambulator General Gait Details: Slow, steady amb pushing IV pole. Pt c/o lightheadedness needing to sit, BP down to 70/37 (RN  notified) and pt rolled back to room to return to supine  Stairs            Wheelchair Mobility    Modified Rankin (Stroke Patients Only)       Balance Overall balance assessment: Mild deficits observed, not formally tested                                           Pertinent Vitals/Pain Pain Assessment: No/denies pain    Home Living Family/patient expects to be discharged to:: Private residence Living Arrangements: Spouse/significant other Available Help at Discharge: Family;Available PRN/intermittently Type of Home: House Home Access: Stairs to enter     Home Layout: Two level;Able to live on main level with bedroom/bathroom   Additional Comments: Wife home during weeks; works as Charity fundraiser    Prior Function Level of Independence: Independent         Comments: Indep with mobility. Has been sleeping on couch since stairs to upstairs bedroom wears him out. Drives. Retired Runner, broadcasting/film/video for [employer]        Extremity/Trunk Assessment   Upper Extremity Assessment Upper Extremity Assessment: Overall WFL for tasks assessed    Lower Extremity Assessment Lower Extremity Assessment: Overall WFL for tasks assessed       Communication   Communication: No difficulties  Cognition Arousal/Alertness: Awake/alert Behavior During Therapy: WFL for tasks assessed/performed Overall Cognitive Status: Within Functional Limits for tasks assessed  General Comments      Exercises     Assessment/Plan    PT Assessment Patient needs continued PT services  PT Problem List Decreased activity tolerance;Decreased balance;Decreased mobility;Cardiopulmonary status limiting activity       PT Treatment Interventions Gait training;DME instruction;Stair training;Functional mobility training;Therapeutic activities;Therapeutic exercise;Balance training;Patient/family education     PT Goals (Current goals can be found in the Care Plan section)  Acute Rehab PT Goals Patient Stated Goal: Get my energy back PT Goal Formulation: With patient Time For Goal Achievement: 02/21/18 Potential to Achieve Goals: Fair    Frequency Min 3X/week   Barriers to discharge        Co-evaluation               AM-PAC PT "6 Clicks" Mobility  Outcome Measure Help needed turning from your back to your side while in a flat bed without using bedrails?: None Help needed moving from lying on your back to sitting on the side of a flat bed without using bedrails?: None Help needed moving to and from a bed to a chair (including a wheelchair)?: None Help needed standing up from a chair using your arms (e.g., wheelchair or bedside chair)?: None Help needed to walk in hospital room?: A Little Help needed climbing 3-5 steps with a railing? : A Little 6 Click Score: 22    End of Session Equipment Utilized During Treatment: Gait belt Activity Tolerance: Patient tolerated treatment well;Treatment limited secondary to medical complications (Comment) Patient left: in bed;with call bell/phone within reach Nurse Communication: Mobility status PT Visit Diagnosis: Other abnormalities of gait and mobility (R26.89)    Time: 0211-1552 PT Time Calculation (min) (ACUTE ONLY): 24 min   Charges:   PT Evaluation $PT Eval Moderate Complexity: 1 Mod PT Treatments $Gait Training: 8-22 mins      Mabeline Caras, PT, DPT Acute Rehabilitation Services  Pager [phone] Office [place] 02/07/2018, 12:33 PM

## 2018-02-07 NOTE — Progress Notes (Signed)
6 Minute Walk Test  Distance - 200 ft      Pre    Post  BP     104/60    96/72  HR    88    108  SaO2    92%    86% RA (up to 91% with 2L O2 placed)  Modified Borg Dyspnea 0    10  RPE    7    17    5  Meter Walk Test  Trial   1) 9.2 sec   2) 8.8 sec  3) 8.2 sec  Average = 8.73 ft/sec   Clinical Frailty Scale - 4    Mabeline Caras, PT, DPT Acute Rehabilitation Services  Pager [phone] Office [phone]

## 2018-02-08 ENCOUNTER — Encounter (HOSPITAL_COMMUNITY): Admission: AD | Disposition: E | Payer: Self-pay | Source: Other Acute Inpatient Hospital | Attending: Cardiology

## 2018-02-08 ENCOUNTER — Inpatient Hospital Stay (HOSPITAL_COMMUNITY): Payer: Medicare Other

## 2018-02-08 DIAGNOSIS — I5043 Acute on chronic combined systolic (congestive) and diastolic (congestive) heart failure: Secondary | ICD-10-CM

## 2018-02-08 DIAGNOSIS — I25118 Atherosclerotic heart disease of native coronary artery with other forms of angina pectoris: Secondary | ICD-10-CM

## 2018-02-08 DIAGNOSIS — R570 Cardiogenic shock: Secondary | ICD-10-CM

## 2018-02-08 DIAGNOSIS — I469 Cardiac arrest, cause unspecified: Secondary | ICD-10-CM

## 2018-02-08 HISTORY — PX: RIGHT HEART CATH: CATH118263

## 2018-02-08 LAB — BASIC METABOLIC PANEL
Anion gap: 16 — ABNORMAL HIGH (ref 5–15)
BUN: 39 mg/dL — AB (ref 8–23)
CHLORIDE: 82 mmol/L — AB (ref 98–111)
CO2: 28 mmol/L (ref 22–32)
Calcium: 9.1 mg/dL (ref 8.9–10.3)
Creatinine, Ser: 2.33 mg/dL — ABNORMAL HIGH (ref 0.61–1.24)
GFR, EST AFRICAN AMERICAN: 30 mL/min — AB (ref >60)
GFR, EST NON AFRICAN AMERICAN: 26 mL/min — AB (ref >60)
Glucose, Bld: 164 mg/dL — ABNORMAL HIGH (ref 70–99)
POTASSIUM: 4.9 mmol/L (ref 3.5–5.1)
SODIUM: 126 mmol/L — AB (ref 135–145)

## 2018-02-08 LAB — POCT I-STAT 3, ART BLOOD GAS (G3+)
Acid-Base Excess: 9 mmol/L — ABNORMAL HIGH (ref 0.0–2.0)
Bicarbonate: 35.6 mmol/L — ABNORMAL HIGH (ref 20.0–28.0)
O2 Saturation: 100 %
PCO2 ART: 56.5 mmHg — AB (ref 32.0–48.0)
PH ART: 7.407 (ref 7.350–7.450)
TCO2: 37 mmol/L — AB (ref 22–32)
pO2, Arterial: 317 mmHg — ABNORMAL HIGH (ref 83.0–108.0)

## 2018-02-08 LAB — HEPARIN LEVEL (UNFRACTIONATED): Heparin Unfractionated: 0.18 IU/mL — ABNORMAL LOW (ref 0.30–0.70)

## 2018-02-08 LAB — CBC WITH DIFFERENTIAL/PLATELET
Abs Immature Granulocytes: 0.08 10*3/uL — ABNORMAL HIGH (ref 0.00–0.07)
BASOS ABS: 0 10*3/uL (ref 0.0–0.1)
Basophils Relative: 1 %
EOS ABS: 0.5 10*3/uL (ref 0.0–0.5)
EOS PCT: 7 %
HEMATOCRIT: 34.3 % — AB (ref 39.0–52.0)
HEMOGLOBIN: 10.4 g/dL — AB (ref 13.0–17.0)
Immature Granulocytes: 1 %
LYMPHS ABS: 2.5 10*3/uL (ref 0.7–4.0)
LYMPHS PCT: 33 %
MCH: 25.6 pg — ABNORMAL LOW (ref 26.0–34.0)
MCHC: 30.3 g/dL (ref 30.0–36.0)
MCV: 84.5 fL (ref 80.0–100.0)
MONO ABS: 0.9 10*3/uL (ref 0.1–1.0)
Monocytes Relative: 13 %
NRBC: 0.3 % — AB (ref 0.0–0.2)
Neutro Abs: 3.4 10*3/uL (ref 1.7–7.7)
Neutrophils Relative %: 45 %
Platelets: 192 10*3/uL (ref 150–400)
RBC: 4.06 MIL/uL — ABNORMAL LOW (ref 4.22–5.81)
RDW: 16.3 % — AB (ref 11.5–15.5)
WBC: 7.4 10*3/uL (ref 4.0–10.5)

## 2018-02-08 LAB — COOXEMETRY PANEL
Carboxyhemoglobin: 1.4 % (ref 0.5–1.5)
Methemoglobin: 2.2 % — ABNORMAL HIGH (ref 0.0–1.5)
O2 Saturation: 46.8 %
Total hemoglobin: 9 g/dL — ABNORMAL LOW (ref 12.0–16.0)

## 2018-02-08 LAB — TROPONIN I
TROPONIN I: 0.61 ng/mL — AB (ref <0.03)
Troponin I: 2.47 ng/mL (ref <0.03)
Troponin I: 8.27 ng/mL (ref <0.03)

## 2018-02-08 LAB — LACTATE DEHYDROGENASE: LDH: 191 U/L (ref 98–192)

## 2018-02-08 LAB — GLUCOSE, CAPILLARY: GLUCOSE-CAPILLARY: 168 mg/dL — AB (ref 70–99)

## 2018-02-08 LAB — PROTIME-INR
INR: 1.13
Prothrombin Time: 14.4 seconds (ref 11.4–15.2)

## 2018-02-08 LAB — LACTIC ACID, PLASMA
LACTIC ACID, VENOUS: 5.6 mmol/L — AB (ref 0.5–1.9)
LACTIC ACID, VENOUS: 1.1 mmol/L (ref 0.5–1.9)

## 2018-02-08 LAB — BRAIN NATRIURETIC PEPTIDE: B NATRIURETIC PEPTIDE 5: 2385.1 pg/mL — AB (ref 0.0–100.0)

## 2018-02-08 LAB — SURGICAL PCR SCREEN
MRSA, PCR: NEGATIVE
Staphylococcus aureus: NEGATIVE

## 2018-02-08 LAB — MAGNESIUM: MAGNESIUM: 2.4 mg/dL (ref 1.7–2.4)

## 2018-02-08 SURGERY — INTRA-AORTIC BALLOON PUMP INSERTION

## 2018-02-08 MED ORDER — HEPARIN (PORCINE) 25000 UT/250ML-% IV SOLN
1300.0000 [IU]/h | INTRAVENOUS | Status: DC
  Administered 2018-02-08: 700 [IU]/h via INTRAVENOUS
  Administered 2018-02-09: 1050 [IU]/h via INTRAVENOUS
  Administered 2018-02-10 – 2018-02-11 (×2): 1300 [IU]/h via INTRAVENOUS
  Administered 2018-02-12: 1400 [IU]/h via INTRAVENOUS
  Administered 2018-02-13 (×2): 1300 [IU]/h via INTRAVENOUS
  Filled 2018-02-08 (×7): qty 250

## 2018-02-08 MED ORDER — NOREPINEPHRINE 4 MG/250ML-% IV SOLN
0.0000 ug/min | INTRAVENOUS | Status: DC
  Administered 2018-02-08: 8 ug/min via INTRAVENOUS
  Administered 2018-02-08: 7 ug/min via INTRAVENOUS
  Administered 2018-02-08: 9 ug/min via INTRAVENOUS
  Filled 2018-02-08 (×2): qty 250

## 2018-02-08 MED ORDER — SODIUM CHLORIDE 0.9 % IV SOLN
0.5000 mg/h | INTRAVENOUS | Status: DC
  Administered 2018-02-08: 1 mg/h via INTRAVENOUS
  Filled 2018-02-08 (×2): qty 10

## 2018-02-08 MED ORDER — SODIUM CHLORIDE 0.9 % IV SOLN
0.5000 mg/h | INTRAVENOUS | Status: DC
  Administered 2018-02-09: 2 mg/h via INTRAVENOUS
  Administered 2018-02-10: 1.5 mg/h via INTRAVENOUS
  Filled 2018-02-08: qty 10

## 2018-02-08 MED ORDER — SODIUM CHLORIDE 0.9% FLUSH: 3.0000 mL | INTRAVENOUS | Status: DC | PRN

## 2018-02-08 MED ORDER — SODIUM CHLORIDE 0.9 % IV SOLN
250.0000 mL | INTRAVENOUS | Status: DC | PRN
  Administered 2018-02-08: 250 mL via INTRAVENOUS
  Administered 2018-02-08: 1000 mL via INTRAVENOUS

## 2018-02-08 MED ORDER — HEPARIN SODIUM (PORCINE) 1000 UNIT/ML IJ SOLN
INTRAMUSCULAR | Status: DC | PRN
  Administered 2018-02-08: 2000 [IU] via INTRAVENOUS
  Administered 2018-02-08: 5000 [IU] via INTRAVENOUS

## 2018-02-08 MED ORDER — LIDOCAINE HCL (PF) 1 % IJ SOLN
INTRAMUSCULAR | Status: DC | PRN
  Administered 2018-02-08: 30 mL

## 2018-02-08 MED ORDER — SODIUM CHLORIDE 0.9% FLUSH
3.0000 mL | Freq: Two times a day (BID) | INTRAVENOUS | Status: DC
  Administered 2018-02-08 – 2018-02-10 (×3): 3 mL via INTRAVENOUS

## 2018-02-08 MED ORDER — LIDOCAINE HCL (PF) 1 % IJ SOLN
INTRAMUSCULAR | Status: AC
  Filled 2018-02-08: qty 30

## 2018-02-08 MED ORDER — HEPARIN SODIUM (PORCINE) 1000 UNIT/ML IJ SOLN
INTRAMUSCULAR | Status: AC
  Filled 2018-02-08: qty 1

## 2018-02-08 MED ORDER — FENTANYL CITRATE (PF) 100 MCG/2ML IJ SOLN
INTRAMUSCULAR | Status: AC
  Administered 2018-02-08: 100 ug
  Filled 2018-02-08: qty 2

## 2018-02-08 MED ORDER — ORAL CARE MOUTH RINSE
15.0000 mL | OROMUCOSAL | Status: DC
  Administered 2018-02-08 – 2018-02-12 (×41): 15 mL via OROMUCOSAL

## 2018-02-08 MED ORDER — CHLORHEXIDINE GLUCONATE 0.12% ORAL RINSE (MEDLINE KIT)
15.0000 mL | Freq: Two times a day (BID) | OROMUCOSAL | Status: DC
  Administered 2018-02-08 – 2018-02-12 (×9): 15 mL via OROMUCOSAL

## 2018-02-08 MED ORDER — HEPARIN (PORCINE) IN NACL 1000-0.9 UT/500ML-% IV SOLN
INTRAVENOUS | Status: DC | PRN
  Administered 2018-02-08: 500 mL

## 2018-02-08 MED ORDER — MIDAZOLAM HCL 2 MG/2ML IJ SOLN
INTRAMUSCULAR | Status: AC
  Administered 2018-02-08: 0.5 mg
  Filled 2018-02-08: qty 2

## 2018-02-08 MED ORDER — FENTANYL BOLUS VIA INFUSION
25.0000 ug | INTRAVENOUS | Status: DC | PRN
  Administered 2018-02-08 – 2018-02-11 (×8): 25 ug via INTRAVENOUS
  Filled 2018-02-08: qty 25

## 2018-02-08 MED ORDER — FENTANYL 2500MCG IN NS 250ML (10MCG/ML) PREMIX INFUSION
25.0000 ug/h | INTRAVENOUS | Status: DC
  Administered 2018-02-08: 100 ug/h via INTRAVENOUS
  Administered 2018-02-08: 125 ug/h via INTRAVENOUS
  Administered 2018-02-09: 150 ug/h via INTRAVENOUS
  Administered 2018-02-10: 200 ug/h via INTRAVENOUS
  Administered 2018-02-10: 225 ug/h via INTRAVENOUS
  Administered 2018-02-11: 100 ug/h via INTRAVENOUS
  Filled 2018-02-08 (×8): qty 250

## 2018-02-08 MED ORDER — HEPARIN (PORCINE) IN NACL 1000-0.9 UT/500ML-% IV SOLN
INTRAVENOUS | Status: AC
  Filled 2018-02-08: qty 1000

## 2018-02-08 MED ORDER — FAMOTIDINE IN NACL 20-0.9 MG/50ML-% IV SOLN
20.0000 mg | INTRAVENOUS | Status: DC
  Administered 2018-02-08: 20 mg via INTRAVENOUS
  Filled 2018-02-08: qty 50

## 2018-02-08 MED ORDER — MIDAZOLAM HCL 2 MG/2ML IJ SOLN
INTRAMUSCULAR | Status: AC
  Administered 2018-02-08: 2 mg
  Filled 2018-02-08: qty 2

## 2018-02-08 MED ORDER — MIDAZOLAM BOLUS VIA INFUSION
1.0000 mg | INTRAVENOUS | Status: DC | PRN
  Administered 2018-02-09 – 2018-02-10 (×2): 1 mg via INTRAVENOUS
  Filled 2018-02-08: qty 1

## 2018-02-08 MED FILL — Medication: Qty: 1 | Status: AC

## 2018-02-08 SURGICAL SUPPLY — 15 items
BALLN IABP SENSA PLUS 8F 50CC (BALLOONS) ×4
BALLOON IABP SENS PLUS 8F 50CC (BALLOONS) ×2 IMPLANT
CATH INFINITI JR4 5F (CATHETERS) ×4 IMPLANT
CATH SWAN GANZ 7F STRAIGHT (CATHETERS) ×4 IMPLANT
GUIDEWIRE .025 260CM (WIRE) ×4 IMPLANT
PACK CARDIAC CATHETERIZATION (CUSTOM PROCEDURE TRAY) ×4 IMPLANT
SHEATH PINNACLE 6F 10CM (SHEATH) ×4 IMPLANT
SHEATH PINNACLE 7F 10CM (SHEATH) ×4 IMPLANT
SHEATH PROBE COVER 6X72 (BAG) ×4 IMPLANT
SLEEVE REPOSITIONING LENGTH 30 (MISCELLANEOUS) ×4 IMPLANT
TRANSDUCER W/STOPCOCK (MISCELLANEOUS) ×4 IMPLANT
TUBING ART PRESS 72  MALE/FEM (TUBING) ×2
TUBING ART PRESS 72 MALE/FEM (TUBING) ×2 IMPLANT
WIRE EMERALD 3MM-J .035X150CM (WIRE) ×4 IMPLANT
WIRE HI TORQ VERSACORE-J 145CM (WIRE) ×4 IMPLANT

## 2018-02-08 NOTE — Progress Notes (Signed)
George Washington for Heparin Indication: IABP balloon pump  Allergies  Allergen Reactions  . Lisinopril     cough    Patient Measurements: Height: '5\' 6"'$  (167.6 cm) Weight: 158 lb 8.2 oz (71.9 kg) IBW/kg (Calculated) : 63.8 HEPARIN DW (KG): 71.4  Vital Signs: Temp: 100 F (37.8 C) (11/28 1900) Temp Source: Core (11/28 1008) BP: 127/74 (11/28 1900) Pulse Rate: 90 (11/28 1900)  Labs: Recent Labs    02/06/18 0126  02/06/18 0636 02/07/18 0424 01/19/2018 0511 02/02/2018 1129 01/25/2018 1700 02/06/2018 1810  HGB  --    < > 9.4* 10.2* 10.4*  --   --   --   HCT  --   --  30.5* 32.6* 34.3*  --   --   --   PLT  --   --  209 200 192  --   --   --   LABPROT  --   --   --   --  14.4  --   --   --   INR  --   --   --   --  1.13  --   --   --   HEPARINUNFRC  --   --   --   --   --   --  0.18*  --   CREATININE 2.07*  --   --  2.11* 2.33*  --   --   --   TROPONINI 0.09*  --  0.08*  --  0.61* 2.47*  --  8.27*   < > = values in this interval not displayed.    Estimated Creatinine Clearance: 24 mL/min (A) (by C-G formula based on SCr of 2.33 mg/dL (H)).   Medical History: Past Medical History:  Diagnosis Date  . Arthritis   . CAD (coronary artery disease)   . CKD (chronic kidney disease), stage III ([place])   . HTN (hypertension)   . Iron deficiency anemia   . Ischemic cardiomyopathy   . Mitral regurgitation   . Narcotic dependence, in remission ([place])   . Retinal vein occlusion   . Severe aortic stenosis   . Systolic heart failure ([place]) 01/2018  . Tobacco abuse     Medications:  Scheduled:  . aspirin EC  81 mg Oral Daily  . atorvastatin  80 mg Oral q1800  . budesonide (PULMICORT) nebulizer solution  0.25 mg Nebulization BID  . chlorhexidine gluconate (MEDLINE KIT)  15 mL Mouth Rinse BID  . furosemide  80 mg Intravenous [id number]  . mouth rinse  15 mL Mouth Rinse 10 times per day  . nicotine  21 mg Transdermal Daily  . senna-docusate  1 tablet  Oral BID  . sodium chloride flush  10-40 mL Intracatheter Q12H  . sodium chloride flush  3 mL Intravenous Q12H  . traZODone  100 mg Oral QHS   Infusions:  . sodium chloride 20 mL/hr at 01/21/2018 1900  . famotidine (PEPCID) IV Stopped (02/04/2018 1806)  . fentaNYL infusion INTRAVENOUS 125 mcg/hr (01/26/2018 0757)  . ferumoxytol 510 mg (02/06/18 1622)  . heparin 700 Units/hr (02/10/2018 1900)  . midazolam (VERSED) infusion    . norepinephrine (LEVOPHED) Adult infusion 10 mcg/min (02/01/2018 1900)    Assessment: Pt is a 50 yoM s/p PEA arrest this morning who went for balloon pump. Pharmacy has been consulted for heparin management.  Heparin level slightly low at 0.18 for lower goal with IABP. Hemoglobin low but stable, plt 192. No bleeding or issues with  infusion per discussion with RN.    Goal of Therapy:  Heparin level 0.2-0.5 units/ml Monitor platelets by anticoagulation protocol: Yes   Plan:  Increase heparin infusion to 850 units/hr 8h heparin level Monitor daily heparin level and CBC, s/sx bleeding  George Washington, PharmD, BCPS Clinical Pharmacist Clinical phone [phone] Please check AMION for all [place] contact numbers 01/23/2018 7:24 PM

## 2018-02-08 NOTE — Progress Notes (Signed)
RT note: RT and Cath lab RN transported patient to cath lab and back. Vital signs stable through out.

## 2018-02-08 NOTE — Progress Notes (Signed)
In Progress  02/04/2018 5:06 AM [place], Vida Roller, RN   Responded to Code [employer]. Patient has a DL PICC. No further access needed at this time.

## 2018-02-08 NOTE — Code Documentation (Signed)
CODE BLUE NOTE  Patient Name: George Washington.   MRN: [id number]   Date of Birth/ Sex: [birth year] , male      Admission Date: 01/14/2018  Attending Provider: Minus Breeding, MD  Primary Diagnosis: Acute on chronic systolic (congestive) heart failure ([place])    Indication: Pt was in his usual state of health until this AM, when he was noted to be unresponsive, pulseless, agonal. Code blue was subsequently called. At the time of arrival on scene, ACLS protocol was underway.    Technical Description:  - CPR performance duration:  23 minutes  - Was defibrillation or cardioversion used? No   - Was external pacer placed? Yes  - Was patient intubated pre/post CPR? Yes    Medications Administered: Y = Yes; Blank = No Amiodarone    Atropine    Calcium    Epinephrine  Y  Lidocaine    Magnesium    Norepinephrine    Phenylephrine    Sodium bicarbonate  Y  Vasopressin      Post CPR evaluation:  - Final Status - Was patient successfully resuscitated ? Yes - What is current rhythm? ST - What is current hemodynamic status? 137bpm & 91/70 @ 0520   Miscellaneous Information:  - Labs sent, including: Yes, CBG  - Primary team notified?  Yes  - Family Notified? Yes  - Additional notes/ transfer status: Transfer to 2 Heart 12        Wilber Oliphant, MD  01/16/2018, 5:39 AM

## 2018-02-08 NOTE — H&P (Signed)
   [name] is a 77 year old with a history of recently diagnosed systolic heart failure (EF 30%), low-flow low-gradient severe aortic stenosis, CAD,HTN, iron deficiency,CKD III and tobacco abuse.Admitted from [facility] for CT surgery and structural heart consultation this is complicated by marked volume overload and worsening renal function.  I received a page at 05:10 that [name] had a PEA arrest on the floor. Per nursing, the patient had an uneventful night and no significant complaints prior to the arrest. A nursing assistant had been called into the room and he initially had a pulse but had PEA arrest while she was there. CPR was initiated and he received 2 x epi with ROSC. He shortly after again had a PEA arrest which resolved with epi x1 and one amp of bicarb. ECG post arrest showed more prominent ST depression in V3-V6 as compared with prior ECG. Patient with known CTO of RCA and 70% stenosis in prox LCx. LAD with non-obstructive disease. During code, patient was intubated. Due to hypotension following arrest, levophed started and milrinone held. Patient transferred to [facility]. Co-ox level 46.8 from 54% yesterday. Discussed with advanced heart failure team and plan for IABP placement this morning. Patient's wife was called and given update at bedside after her arrival at the hospital. Critical care consult placed.  Bryna Colander, MD Cardiovascular Fellow  Was called for IABP placement.  Patient presents here today for IABP placement in the setting of PEA arrest with history of severe left ear and severe CAD.  Plan is for hemodynamic support with IABP as a bridge to hope for TAVR.  Risks, benefits, alternatives and indications of procedure is planned with patient in detail.  Informed consent obtained.    Glenetta Hew, M.D., M.S. Interventional Cardiologist   Pager # [phone] Phone # [phone] [address] [place], [place] [address]

## 2018-02-08 NOTE — Procedures (Signed)
Intubation Procedure Note George Washington [id number] [birth year]  Procedure: Intubation Indications: Respiratory insufficiency  Procedure Details Consent: Unable to obtain consent because of emergent medical necessity. Time Out: Verified patient identification, verified procedure, site/side was marked, verified correct patient position, special equipment/implants available, medications/allergies/relevent history reviewed, required imaging and test results available.  Performed  Maximum sterile technique was used including gloves and hand hygiene.  Miller and 4    Evaluation Hemodynamic Status: Transient hypotension treated with pressors; O2 sats: currently acceptable Patient's Current Condition: stable Complications: No apparent complications Patient did tolerate procedure well. Chest X-ray ordered to verify placement.  CXR: pending.   Myrtie Neither 01/24/2018

## 2018-02-08 NOTE — Progress Notes (Addendum)
Chaplain responded to code.  Family was called and arrived later after patient was transferred to [facility]. Wife, Baker Janus, and son, Gerald Stabs, are bedside.  Wife is an Therapist, sports at [employer]. She is anxious and teary.  Son appears stressed, trying to be strong for his mother.  The patient's illness surfaced only 3 weeks ago and has progressed rapidly. Upon arriving to the unit, wife had feared that her husband had already passed.  She states that her husband, patient, had requested the family to stay away all day yesterday, and not come to the hospital; this bewildered wife, but she complied.  Patient did not return repeated calls from wife, brother in [place], whom he hasn't seen in 48 years, but patient did return call to son, Gerald Stabs for a brief conversation.  Family is [employer]. Clinical biochemist gave a short prayer, bedside, and brought refreshments.    Please call for ongoing support as needed or requested.  Luana Shu [phone]     02/09/2018 0500  Clinical Encounter Type  Visited With Patient not available  Visit Type Code  Referral From Care management  Consult/Referral To Chaplain

## 2018-02-08 NOTE — Progress Notes (Signed)
Pt taken to cath lab at [place]. Report given to cath team.Ra Pfiester [facility] Advocate

## 2018-02-08 NOTE — Significant Event (Signed)
Mr Waddle is a 77 year old with a history of recently diagnosed systolic heart failure (EF 30%), low-flow low-gradient severe aortic stenosis, CAD,HTN, iron deficiency,CKD III and tobacco abuse.Admitted from [facility] for CT surgery and structural heart consultation this is complicated by marked volume overload and worsening renal function.  I received a page at 05:10 that Mr. Hiltz had a PEA arrest on the floor. Per nursing, the patient had an uneventful night and no significant complaints prior to the arrest. A nursing assistant had been called into the room and he initially had a pulse but had PEA arrest while she was there. CPR was initiated and he received 2 x epi with ROSC. He shortly after again had a PEA arrest which resolved with epi x1 and one amp of bicarb. ECG post arrest showed more prominent ST depression in V3-V6 as compared with prior ECG. Patient with known CTO of RCA and 70% stenosis in prox LCx. LAD with non-obstructive disease. During code, patient was intubated. Due to hypotension following arrest, levophed started and milrinone held. Patient transferred to [facility]. Co-ox level 46.8 from 54% yesterday. Discussed with advanced heart failure team and plan for IABP placement this morning. Patient's wife was called and given update at bedside after her arrival at the hospital. Critical care consult placed.  Bryna Colander, MD Cardiovascular Fellow

## 2018-02-08 NOTE — Progress Notes (Signed)
ANTICOAGULATION CONSULT NOTE - Initial Consult  Pharmacy Consult for Heparin Indication: IABP balloon pump  Allergies  Allergen Reactions  . Lisinopril     cough    Patient Measurements: Height: 5\' 6"  (167.6 cm) Weight: 158 lb 8.2 oz (71.9 kg) IBW/kg (Calculated) : 63.8 HEPARIN DW (KG): 71.4  Vital Signs: BP: 135/87 (11/28 0917) Pulse Rate: 84 (11/28 0917)  Labs: Recent Labs    02/06/18 0126  02/06/18 0636 02/07/18 0424 02/01/2018 0511  HGB  --    < > 9.4* 10.2* 10.4*  HCT  --   --  30.5* 32.6* 34.3*  PLT  --   --  209 200 192  LABPROT  --   --   --   --  14.4  INR  --   --   --   --  1.13  CREATININE 2.07*  --   --  2.11* 2.33*  TROPONINI 0.09*  --  0.08*  --  0.61*   < > = values in this interval not displayed.    Estimated Creatinine Clearance: 24 mL/min (A) (by C-G formula based on SCr of 2.33 mg/dL (H)).   Medical History: Past Medical History:  Diagnosis Date  . Arthritis   . CAD (coronary artery disease)   . CKD (chronic kidney disease), stage III ([place])   . HTN (hypertension)   . Iron deficiency anemia   . Ischemic cardiomyopathy   . Mitral regurgitation   . Narcotic dependence, in remission ([place])   . Retinal vein occlusion   . Severe aortic stenosis   . Systolic heart failure ([place]) 01/2018  . Tobacco abuse     Medications:  Scheduled:  . [MAR Hold] aspirin EC  81 mg Oral Daily  . [MAR Hold] atorvastatin  80 mg Oral q1800  . [MAR Hold] budesonide (PULMICORT) nebulizer solution  0.25 mg Nebulization BID  . [MAR Hold] enoxaparin (LOVENOX) injection  30 mg Subcutaneous Q24H  . [MAR Hold] furosemide  80 mg Intravenous [id number]  . [MAR Hold] gabapentin  100 mg Oral TID  . [MAR Hold] nicotine  21 mg Transdermal Daily  . [MAR Hold] senna-docusate  1 tablet Oral BID  . [MAR Hold] sodium chloride flush  10-40 mL Intracatheter Q12H  . [MAR Hold] traZODone  100 mg Oral QHS   Infusions:  . fentaNYL infusion INTRAVENOUS 125 mcg/hr (01/13/2018 0757)  .  [MAR Hold] ferumoxytol 510 mg (02/06/18 1622)  . midazolam (VERSED) infusion 1 mg/hr (02/09/2018 0758)  . milrinone 0.25 mcg/kg/min (02/07/18 1723)  . [MAR Hold] norepinephrine (LEVOPHED) Adult infusion 7 mcg/min (02/07/2018 7824)    Assessment: Pt is a 26 yoM s/p PEA arrest this morning who went for balloon pump. Pharmacy has been consulted for heparin management. Patient received 7000 units of heparin in cath lab.  Will start heparin drip no bolus. Hemoglobin low but stable, plt 192. No signs/symptoms of bleeding reported by nursing.    Goal of Therapy:  Heparin level 0.2-0.5 units/ml Monitor platelets by anticoagulation protocol: Yes   Plan:  Start heparin infusion at 700 units/hr Check anti-Xa level in 6 hours and daily while on heparin Continue to monitor H&H and platelets   Claiborne Billings, PharmD PGY2 Cardiology Pharmacy Resident Phone [phone] Please check AMION for all Pharmacist numbers by unit 02/10/2018 10:01 AM

## 2018-02-08 NOTE — Progress Notes (Signed)
Advanced Heart Failure Rounding Note  PCP-Cardiologist: Kathlyn Sacramento, MD   Subjective:    Developed PEA arrest overnight. Code blue x 2. Had CPR and epi with prompt ROSC. Co-ox pre-code 47%  Intubated and taken to cath lab. IABP and swan placed. Was awake and following commands in cath lab. Now sedated on vent.   RA 12 RV 48/15 PA 53/28 (39) PCWP 35 Fick 4.1/2.24   Objective:   Weight Range: 71.9 kg Body mass index is 25.58 kg/m.   Vital Signs:   Pulse Rate:  [84-127] 84 (11/28 0917) Resp:  [10-47] 13 (11/28 0913) BP: (73-141)/(44-90) 135/87 (11/28 0917) SpO2:  [0 %-100 %] 100 % (11/28 0943) FiO2 (%):  [50 %-100 %] 50 % (11/28 0943) Weight:  [71.9 kg] 71.9 kg (11/28 0500) Last BM Date: 02/03/2018  Weight change: Filed Weights   02/06/18 0528 02/07/18 0519 02/07/2018 0500  Weight: 71.3 kg 70.7 kg 71.9 kg    Intake/Output:   Intake/Output Summary (Last 24 hours) at 02/06/2018 1014 Last data filed at 01/20/2018 0600 Gross per 24 hour  Intake -  Output 875 ml  Net -875 ml      Physical Exam    General:  Intubated/sedated. No resp difficulty HEENT: normal + ETT Neck: supple. JVP to jaw. Carotids 2+ bilat; + bruits. No lymphadenopathy or thryomegaly appreciated. Cor: PMI nondisplaced. Regular rate & rhythm. 2/6 SEM RUSB Lungs: clear anteriorly  Abdomen: soft, nontender, nondistended. No hepatosplenomegaly. No bruits or masses. Good bowel sounds. Extremities: no cyanosis, clubbing, rash, 1+ edema R groin IABP and swan Neuro: intubated/sedated   Telemetry   Sinus 80s Personally reviewed  EKG    N/a   Labs    CBC Recent Labs    02/07/18 0424 01/25/2018 0511  WBC 5.3 7.4  NEUTROABS 3.3 3.4  HGB 10.2* 10.4*  HCT 32.6* 34.3*  MCV 83.8 84.5  PLT 200 941   Basic Metabolic Panel Recent Labs    02/07/18 0424 01/25/2018 0511  NA 130* 126*  K 3.8 4.9  CL 83* 82*  CO2 33* 28  GLUCOSE 130* 164*  BUN 37* 39*  CREATININE 2.11* 2.33*  CALCIUM 9.5  9.1  MG  --  2.4   Liver Function Tests Recent Labs    02/06/18 0126  AST 39  ALT 38  ALKPHOS 63  BILITOT 1.0  PROT 6.8  ALBUMIN 3.5   No results for input(s): LIPASE, AMYLASE in the last 72 hours. Cardiac Enzymes Recent Labs    02/06/18 0126 02/06/18 0636 01/27/2018 0511  TROPONINI 0.09* 0.08* 0.61*    BNP: BNP (last 3 results) Recent Labs    02/06/18 0126 02/09/2018 0511  BNP >4,500.0* 2,385.1*    ProBNP (last 3 results) No results for input(s): PROBNP in the last 8760 hours.   D-Dimer No results for input(s): DDIMER in the last 72 hours. Hemoglobin A1C Recent Labs    02/06/18 0126  HGBA1C 5.8*   Fasting Lipid Panel No results for input(s): CHOL, HDL, LDLCALC, TRIG, CHOLHDL, LDLDIRECT in the last 72 hours. Thyroid Function Tests Recent Labs    02/07/18 0831  TSH 2.478    Other results:   Imaging    Dg Chest Port 1 View  Result Date: 01/22/2018 CLINICAL DATA:  Post CPR. EXAM: PORTABLE CHEST 1 VIEW COMPARISON:  Frontal and lateral views 2 days prior FINDINGS: Endotracheal tube tip 3.8 cm from the carina. Tip and side port of the enteric tube below the diaphragm in the  stomach. Right upper extremity PICC tip in the distal SVC. Cardiomegaly with slight progression. Slight increase in bilateral pleural effusions and bibasilar airspace disease. Increased pulmonary edema. No visualized pneumothorax. No acute osseous abnormalities. IMPRESSION: 1. Endotracheal tube tip 3.8 cm from the carina. Enteric tube and right upper extremity PICC in place. 2. Increased bilateral pleural effusions and bibasilar airspace disease/atelectasis. Increased pulmonary edema. Electronically Signed   By: Keith Rake M.D.   On: 02/06/2018 07:02     Medications:     Scheduled Medications: . aspirin EC  81 mg Oral Daily  . atorvastatin  80 mg Oral q1800  . budesonide (PULMICORT) nebulizer solution  0.25 mg Nebulization BID  . enoxaparin (LOVENOX) injection  30 mg  Subcutaneous Q24H  . furosemide  80 mg Intravenous [id number]  . gabapentin  100 mg Oral TID  . nicotine  21 mg Transdermal Daily  . senna-docusate  1 tablet Oral BID  . sodium chloride flush  10-40 mL Intracatheter Q12H  . sodium chloride flush  3 mL Intravenous Q12H  . traZODone  100 mg Oral QHS    Infusions: . sodium chloride    . fentaNYL infusion INTRAVENOUS 125 mcg/hr (01/31/2018 0757)  . [MAR Hold] ferumoxytol 510 mg (02/06/18 1622)  . midazolam (VERSED) infusion 1 mg/hr (01/23/2018 0758)  . milrinone 0.25 mcg/kg/min (02/07/18 1723)  . [MAR Hold] norepinephrine (LEVOPHED) Adult infusion 7 mcg/min (01/22/2018 9622)    PRN Medications: sodium chloride, [MAR Hold] acetaminophen, [MAR Hold] bisacodyl, [MAR Hold] bisacodyl, [MAR Hold] fentaNYL, [MAR Hold] guaiFENesin, [MAR Hold] HYDROcodone-acetaminophen, [MAR Hold] ipratropium-albuterol, [MAR Hold] midazolam, [MAR Hold] nitroGLYCERIN, [MAR Hold] ondansetron, [MAR Hold] ondansetron (ZOFRAN) IV, [MAR Hold] polyvinyl alcohol, [MAR Hold] sodium chloride flush, sodium chloride flush, [MAR Hold] traMADol    Patient Profile   George Washington is a 77 year old with a history of recently diagnosed systolic heart failure earlier this month, HTN, iron deficiency,CKD III and tobacco abuse.    Admitted from [facility] for CT surgery and structural heart consultation this is complicated by marked volume overload and worsening renal function.   Assessment/Plan   1. PEA arrest x 2 on 11/28 - s/p CPR and EPI with prompt ROSC - IABP and swan placed this am by Dr. Ellyn Hack - Hemodynamics improved. Can restart milrinone or NE as needed - Long discussion with wife and son as well as Dr. Aundra Dubin. He is marginal candidate for TAVR with low EF, COPD and CKD IV however was fairly functional at baseline. Will continue current level of support but if arrests again will not perform CPR. Meds and shock ok  2. Acute Systolic Heart Failure -> cardiogenic shock , ICM/NICM with  valvular disease .  - ECHO 01/2013 EF ~30%. Severe AS . Suspect low output heart failure with + S3 and tachycardia.  - Co-ox low prior to arrest this am. Milrinone now off. Hemodynamics improved with IABP. Can restart milrinone or NE as needed - Remains volume overloaded. Continue IV lasix as tolerated with severe AS - No BB with shock - No Arb, dig, spiro with elevated creatinine and shock .  - Start heparin for IABP  3. Acute on chronic hypoxic respiratory failure s/p cardiac arrest - now intubated. Will ask CCM to help manage vent  - severe COPD at baseline with PFTs 02/06/18 FEV1 0.94L (32%), FVC 1.48 (36%) DLCO 38%  4. CAD - LHC 01/31/2018 with Ost RCA to Prox RCA lesion 100% stenosed, Prox CX lesion 70%, and mid Cx lesion 30%  stenosed. Not CABG canddiate -. Continue aspirin and statin.   5. Severe Aortic Stenosis  - Structural heart team with Dr Angelena Form and Dr Roxy Manns following for possible TAVR.   6. AKI with CKD Stage III-IV -Creatinine 1.9>2.1-> 2.3. Follow closely post-arrest   7. George  - Moderate by echo. Hopefully will improve with diuresis and treatment of HF.   8. Anemia  -Iron sats 9% on 01/23/2018 .  - Received feraheme 11/26  9. Hyponatremia.   Sodium 126  May need tolvaptan.   CRITICAL CARE Performed by: Glori Bickers  Total critical care time: 45 minutes  Critical care time was exclusive of separately billable procedures and treating other patients.  Critical care was necessary to treat or prevent imminent or life-threatening deterioration.  Critical care was time spent personally by me (independent of midlevel providers or residents) on the following activities: development of treatment plan with patient and/or surrogate as well as nursing, discussions with consultants, evaluation of patient's response to treatment, examination of patient, obtaining history from patient or surrogate, ordering and performing treatments and interventions, ordering and  review of laboratory studies, ordering and review of radiographic studies, pulse oximetry and Astle-evaluation of patient's condition.   Length of Stay: 3  Glori Bickers, MD  02/06/2018, 10:14 AM  Advanced Heart Failure Team Pager [phone] (M-F; 7a - 4p)  Please contact [facility] for night-coverage after hours (4p -7a ) and weekends on [email or web address]

## 2018-02-08 NOTE — Interval H&P Note (Signed)
History and Physical Interval Note:  01/21/2018 7:22 AM  Neal Dy.  has presented today for surgery, with the diagnosis of -  PEA Arrest, Severe AS, Severe CAD.  The various methods of treatment have been discussed with the patient and family. After consideration of risks, benefits and other options for treatment, the patient has consented to  Procedure(s): Intra-Aortic Balloon Pump Insertion as a surgical intervention .  The patient's history has been reviewed, patient examined, no change in status, stable for surgery.  I have reviewed the patient's chart and labs.  Questions were answered to the patient's satisfaction.     Glenetta Hew

## 2018-02-09 ENCOUNTER — Encounter (HOSPITAL_COMMUNITY): Payer: Self-pay | Admitting: Cardiology

## 2018-02-09 DIAGNOSIS — J9601 Acute respiratory failure with hypoxia: Secondary | ICD-10-CM

## 2018-02-09 DIAGNOSIS — I350 Nonrheumatic aortic (valve) stenosis: Secondary | ICD-10-CM

## 2018-02-09 LAB — BASIC METABOLIC PANEL
Anion gap: 12 (ref 5–15)
BUN: 42 mg/dL — ABNORMAL HIGH (ref 8–23)
CALCIUM: 8.4 mg/dL — AB (ref 8.9–10.3)
CO2: 30 mmol/L (ref 22–32)
Chloride: 87 mmol/L — ABNORMAL LOW (ref 98–111)
Creatinine, Ser: 2.92 mg/dL — ABNORMAL HIGH (ref 0.61–1.24)
GFR calc Af Amer: 23 mL/min — ABNORMAL LOW (ref >60)
GFR calc non Af Amer: 20 mL/min — ABNORMAL LOW (ref >60)
Glucose, Bld: 85 mg/dL (ref 70–99)
Potassium: 3.7 mmol/L (ref 3.5–5.1)
Sodium: 129 mmol/L — ABNORMAL LOW (ref 135–145)

## 2018-02-09 LAB — POCT I-STAT 3, ART BLOOD GAS (G3+)
Acid-Base Excess: 11 mmol/L — ABNORMAL HIGH (ref 0.0–2.0)
Acid-Base Excess: 10 mmol/L — ABNORMAL HIGH (ref 0.0–2.0)
BICARBONATE: 33.3 mmol/L — AB (ref 20.0–28.0)
Bicarbonate: 35.5 mmol/L — ABNORMAL HIGH (ref 20.0–28.0)
O2 Saturation: 100 %
O2 Saturation: 97 %
Patient temperature: 37.7
TCO2: 37 mmol/L — ABNORMAL HIGH (ref 22–32)
TCO2: 35 mmol/L — ABNORMAL HIGH (ref 22–32)
pCO2 arterial: 46.5 mmHg (ref 32.0–48.0)
pCO2 arterial: 41.4 mmHg (ref 32.0–48.0)
pH, Arterial: 7.49 — ABNORMAL HIGH (ref 7.350–7.450)
pH, Arterial: 7.517 — ABNORMAL HIGH (ref 7.350–7.450)
pO2, Arterial: 384 mmHg — ABNORMAL HIGH (ref 83.0–108.0)
pO2, Arterial: 89 mmHg (ref 83.0–108.0)

## 2018-02-09 LAB — CBC WITH DIFFERENTIAL/PLATELET
Abs Immature Granulocytes: 0.03 10*3/uL (ref 0.00–0.07)
Basophils Absolute: 0 10*3/uL (ref 0.0–0.1)
Basophils Relative: 1 %
EOS ABS: 0.3 10*3/uL (ref 0.0–0.5)
Eosinophils Relative: 3 %
HCT: 26.4 % — ABNORMAL LOW (ref 39.0–52.0)
Hemoglobin: 8.6 g/dL — ABNORMAL LOW (ref 13.0–17.0)
Immature Granulocytes: 0 %
Lymphocytes Relative: 12 %
Lymphs Abs: 1 10*3/uL (ref 0.7–4.0)
MCH: 26.3 pg (ref 26.0–34.0)
MCHC: 32.6 g/dL (ref 30.0–36.0)
MCV: 80.7 fL (ref 80.0–100.0)
Monocytes Absolute: 0.7 10*3/uL (ref 0.1–1.0)
Monocytes Relative: 8 %
Neutro Abs: 6.3 10*3/uL (ref 1.7–7.7)
Neutrophils Relative %: 76 %
Platelets: 192 10*3/uL (ref 150–400)
RBC: 3.27 MIL/uL — ABNORMAL LOW (ref 4.22–5.81)
RDW: 16 % — ABNORMAL HIGH (ref 11.5–15.5)
WBC: 8.3 10*3/uL (ref 4.0–10.5)
nRBC: 0 % (ref 0.0–0.2)

## 2018-02-09 LAB — HEPARIN LEVEL (UNFRACTIONATED)
Heparin Unfractionated: 0.1 IU/mL — ABNORMAL LOW (ref 0.30–0.70)
Heparin Unfractionated: 0.15 IU/mL — ABNORMAL LOW (ref 0.30–0.70)
Heparin Unfractionated: 0.18 IU/mL — ABNORMAL LOW (ref 0.30–0.70)

## 2018-02-09 LAB — CBC
HCT: 27.7 % — ABNORMAL LOW (ref 39.0–52.0)
HEMATOCRIT: 28.1 % — AB (ref 39.0–52.0)
Hemoglobin: 8.6 g/dL — ABNORMAL LOW (ref 13.0–17.0)
Hemoglobin: 8.8 g/dL — ABNORMAL LOW (ref 13.0–17.0)
MCH: 24.9 pg — ABNORMAL LOW (ref 26.0–34.0)
MCH: 26.1 pg (ref 26.0–34.0)
MCHC: 30.6 g/dL (ref 30.0–36.0)
MCHC: 31.8 g/dL (ref 30.0–36.0)
MCV: 81.4 fL (ref 80.0–100.0)
MCV: 82.2 fL (ref 80.0–100.0)
Platelets: 209 10*3/uL (ref 150–400)
Platelets: 181 10*3/uL (ref 150–400)
RBC: 3.45 MIL/uL — ABNORMAL LOW (ref 4.22–5.81)
RBC: 3.37 MIL/uL — ABNORMAL LOW (ref 4.22–5.81)
RDW: 16.1 % — AB (ref 11.5–15.5)
RDW: 16.5 % — ABNORMAL HIGH (ref 11.5–15.5)
WBC: 8.4 10*3/uL (ref 4.0–10.5)
WBC: 9.6 10*3/uL (ref 4.0–10.5)
nRBC: 0 % (ref 0.0–0.2)
nRBC: 0 % (ref 0.0–0.2)

## 2018-02-09 LAB — COOXEMETRY PANEL
Carboxyhemoglobin: 1.9 % — ABNORMAL HIGH (ref 0.5–1.5)
Methemoglobin: 1.5 % (ref 0.0–1.5)
O2 Saturation: 59.5 %
Total hemoglobin: 8.8 g/dL — ABNORMAL LOW (ref 12.0–16.0)

## 2018-02-09 LAB — POCT I-STAT 3, VENOUS BLOOD GAS (G3P V)
Acid-Base Excess: 8 mmol/L — ABNORMAL HIGH (ref 0.0–2.0)
Acid-Base Excess: 9 mmol/L — ABNORMAL HIGH (ref 0.0–2.0)
Bicarbonate: 33.8 mmol/L — ABNORMAL HIGH (ref 20.0–28.0)
Bicarbonate: 35.1 mmol/L — ABNORMAL HIGH (ref 20.0–28.0)
O2 Saturation: 57 %
O2 Saturation: 59 %
TCO2: 35 mmol/L — ABNORMAL HIGH (ref 22–32)
TCO2: 37 mmol/L — ABNORMAL HIGH (ref 22–32)
pCO2, Ven: 49.7 mmHg (ref 44.0–60.0)
pCO2, Ven: 51.4 mmHg (ref 44.0–60.0)
pH, Ven: 7.441 — ABNORMAL HIGH (ref 7.250–7.430)
pH, Ven: 7.442 — ABNORMAL HIGH (ref 7.250–7.430)
pO2, Ven: 29 mmHg — CL (ref 32.0–45.0)
pO2, Ven: 30 mmHg — CL (ref 32.0–45.0)

## 2018-02-09 LAB — POCT ACTIVATED CLOTTING TIME
Activated Clotting Time: 191 seconds
Activated Clotting Time: 208 seconds

## 2018-02-09 LAB — MAGNESIUM: Magnesium: 1.7 mg/dL (ref 1.7–2.4)

## 2018-02-09 MED ORDER — SENNOSIDES 8.8 MG/5ML PO SYRP
5.0000 mL | ORAL_SOLUTION | Freq: Two times a day (BID) | ORAL | Status: DC
  Administered 2018-02-09 – 2018-02-10 (×3): 5 mL via ORAL
  Filled 2018-02-09 (×3): qty 5

## 2018-02-09 MED ORDER — SODIUM CHLORIDE 0.9 % IV SOLN
1.0000 g | INTRAVENOUS | Status: DC
  Administered 2018-02-09 – 2018-02-11 (×3): 1 g via INTRAVENOUS
  Filled 2018-02-09 (×4): qty 1

## 2018-02-09 MED ORDER — ASPIRIN 81 MG PO CHEW
81.0000 mg | CHEWABLE_TABLET | Freq: Every day | ORAL | Status: DC
  Administered 2018-02-09 – 2018-02-12 (×4): 81 mg
  Filled 2018-02-09 (×4): qty 1

## 2018-02-09 MED ORDER — NOREPINEPHRINE 16 MG/250ML-% IV SOLN
0.0000 ug/min | INTRAVENOUS | Status: DC
  Administered 2018-02-09: 8 ug/min via INTRAVENOUS
  Administered 2018-02-09: 20 ug/min via INTRAVENOUS
  Administered 2018-02-10: 24 ug/min via INTRAVENOUS
  Administered 2018-02-10 – 2018-02-11 (×2): 20 ug/min via INTRAVENOUS
  Administered 2018-02-12: 17 ug/min via INTRAVENOUS
  Administered 2018-02-12: 22 ug/min via INTRAVENOUS
  Administered 2018-02-13: 18 ug/min via INTRAVENOUS
  Filled 2018-02-09 (×7): qty 250

## 2018-02-09 MED ORDER — VANCOMYCIN HCL 10 G IV SOLR
1500.0000 mg | Freq: Once | INTRAVENOUS | Status: AC
  Administered 2018-02-09: 1500 mg via INTRAVENOUS
  Filled 2018-02-09: qty 1500

## 2018-02-09 MED ORDER — POTASSIUM CHLORIDE CRYS ER 20 MEQ PO TBCR: 20.0000 meq | EXTENDED_RELEASE_TABLET | Freq: Once | ORAL | Status: DC

## 2018-02-09 MED ORDER — FAMOTIDINE 40 MG/5ML PO SUSR
20.0000 mg | Freq: Every day | ORAL | Status: DC
  Administered 2018-02-09 – 2018-02-12 (×4): 20 mg
  Filled 2018-02-09 (×4): qty 2.5

## 2018-02-09 MED ORDER — POTASSIUM CHLORIDE 20 MEQ/15ML (10%) PO SOLN
20.0000 meq | Freq: Once | ORAL | Status: AC
  Administered 2018-02-09: 20 meq via ORAL
  Filled 2018-02-09: qty 15

## 2018-02-09 MED ORDER — VITAL AF 1.2 CAL PO LIQD
1000.0000 mL | ORAL | Status: DC
  Administered 2018-02-09: 1000 mL
  Administered 2018-02-10 – 2018-02-11 (×2): 1500 mL
  Filled 2018-02-09: qty 1000
  Filled 2018-02-09: qty 1500
  Filled 2018-02-09 (×3): qty 1000

## 2018-02-09 MED ORDER — DOCUSATE SODIUM 50 MG/5ML PO LIQD
100.0000 mg | Freq: Two times a day (BID) | ORAL | Status: DC
  Administered 2018-02-09 – 2018-02-10 (×3): 100 mg via ORAL
  Filled 2018-02-09 (×3): qty 10

## 2018-02-09 MED ORDER — MAGNESIUM SULFATE 4 GM/100ML IV SOLN
4.0000 g | Freq: Once | INTRAVENOUS | Status: AC
  Administered 2018-02-09: 4 g via INTRAVENOUS
  Filled 2018-02-09: qty 100

## 2018-02-09 MED ORDER — VANCOMYCIN VARIABLE DOSE PER UNSTABLE RENAL FUNCTION (PHARMACIST DOSING): Status: DC

## 2018-02-09 MED ORDER — FUROSEMIDE 10 MG/ML IJ SOLN
80.0000 mg | Freq: Three times a day (TID) | INTRAMUSCULAR | Status: DC
  Administered 2018-02-09 – 2018-02-11 (×4): 80 mg via INTRAVENOUS
  Filled 2018-02-09 (×4): qty 8

## 2018-02-09 NOTE — Progress Notes (Signed)
TCTS BRIEF SICU PROGRESS NOTE  1 Day Post-Op  S/P Procedure(s) (LRB): Intra-Aortic Balloon Pump Insertion RIGHT HEART CATH (N/A)   Events of last 48 hours noted. Patient currently sedated but follows commands, appears neurologically intact NSR w/ stable hemodynamics, PA pressures relatively low and co-ox 59.5% Oxygenating well, no CXR done this morning Diuresing fairly well, creatinine up 2.9  Impression: Prognosis remains guarded but not hopeless.    [name] would not be considered a candidate for conventional surgery under any circumstances but I agree that it seems reasonable to pursue an aggressive approach with hope that we might be able to consider TAVR next week.  Will need to obtain cardiac gated CTA heart and CTA chest/abd/pelvis at some point once renal function returns towards baseline.  George Alberts, MD 02/09/2018 8:24 AM

## 2018-02-09 NOTE — Progress Notes (Addendum)
Advanced Heart Failure Rounding Note  PCP-Cardiologist: Kathlyn [place], MD   Subjective:    Had PEA arrest on 11/28. Code blue x 2. Had CPR and epi with prompt ROSC. Co-ox pre-code 47% Intubated and taken to cath lab. IABP and swan placed.    Now awake on vent. Chest sore. On NE 8. FiO2 30% on vent. Creatinine up to 2.9  Swan numbers done personally  CVP 11 PA 53/28 PCWP 28 Thermo 4.7/2.6 Co-ox 59.5%   Objective:   Weight Range: 74.7 kg Body mass index is 26.58 kg/m.   Vital Signs:   Temp:  [98.1 F (36.7 C)-100 F (37.8 C)] 99.7 F (37.6 C) (11/29 1015) Pulse Rate:  [72-148] 75 (11/29 1015) Resp:  [10-19] 15 (11/29 1015) BP: (92-151)/(46-96) 116/81 (11/29 1000) SpO2:  [92 %-100 %] 96 % (11/29 1015) Arterial Line BP: (73-147)/(41-68) 103/53 (11/29 1015) FiO2 (%):  [30 %-40 %] 30 % (11/29 0812) Weight:  [74.7 kg] 74.7 kg (11/29 0500) Last BM Date: 01/23/2018  Weight change: Filed Weights   02/07/18 0519 02/07/2018 0500 02/09/18 0500  Weight: 70.7 kg 71.9 kg 74.7 kg    Intake/Output:   Intake/Output Summary (Last 24 hours) at 02/09/2018 1139 Last data filed at 02/09/2018 1100 Gross per 24 hour  Intake 2072.68 ml  Output 2490 ml  Net -417.32 ml      Physical Exam    General:  Intubated. Awake on vent following all commands HEENT: normal + ETT Neck: supple. JVP to jaw Carotids 2+ bilat; no bruits. No lymphadenopathy or thryomegaly appreciated. Cor: PMI nondisplaced. Regular rate & rhythm. 2/6 AS. Lungs: clear Abdomen: soft, nontender, nondistended. No hepatosplenomegaly. No bruits or masses. Good bowel sounds. Extremities: no cyanosis, clubbing, rash, 2+ edema L groin IABP and swan ok Neuro: awake on vent  Telemetry   Sinus 70-80s Frequent PVCs Personally reviewed  EKG    N/a   Labs    CBC Recent Labs    02/06/2018 0511 02/09/18 0425 02/09/18 0548  WBC 7.4 8.3 8.4  NEUTROABS 3.4 6.3  --   HGB 10.4* 8.6* 8.6*  HCT 34.3* 26.4* 28.1*    MCV 84.5 80.7 81.4  PLT 192 192 268   Basic Metabolic Panel Recent Labs    01/29/2018 0511 02/09/18 0425 02/09/18 0938  NA 126* 129*  --   K 4.9 3.7  --   CL 82* 87*  --   CO2 28 30  --   GLUCOSE 164* 85  --   BUN 39* 42*  --   CREATININE 2.33* 2.92*  --   CALCIUM 9.1 8.4*  --   MG 2.4  --  1.7   Liver Function Tests No results for input(s): AST, ALT, ALKPHOS, BILITOT, PROT, ALBUMIN in the last 72 hours. No results for input(s): LIPASE, AMYLASE in the last 72 hours. Cardiac Enzymes Recent Labs    02/01/2018 0511 02/04/2018 1129 02/09/2018 1810  TROPONINI 0.61* 2.47* 8.27*    BNP: BNP (last 3 results) Recent Labs    02/06/18 0126 01/25/2018 0511  BNP >4,500.0* 2,385.1*    ProBNP (last 3 results) No results for input(s): PROBNP in the last 8760 hours.   D-Dimer No results for input(s): DDIMER in the last 72 hours. Hemoglobin A1C No results for input(s): HGBA1C in the last 72 hours. Fasting Lipid Panel No results for input(s): CHOL, HDL, LDLCALC, TRIG, CHOLHDL, LDLDIRECT in the last 72 hours. Thyroid Function Tests Recent Labs    02/07/18 0831  TSH  2.478    Other results:   Imaging    No results found.   Medications:     Scheduled Medications: . aspirin  81 mg Per Tube Daily  . atorvastatin  80 mg Oral q1800  . budesonide (PULMICORT) nebulizer solution  0.25 mg Nebulization BID  . chlorhexidine gluconate (MEDLINE KIT)  15 mL Mouth Rinse BID  . docusate  100 mg Oral BID  . famotidine  20 mg Per Tube Daily  . furosemide  80 mg Intravenous TID  . mouth rinse  15 mL Mouth Rinse 10 times per day  . nicotine  21 mg Transdermal Daily  . sennosides  5 mL Oral BID  . sodium chloride flush  10-40 mL Intracatheter Q12H  . sodium chloride flush  3 mL Intravenous Q12H  . traZODone  100 mg Oral QHS    Infusions: . sodium chloride 10 mL/hr at 02/09/18 1100  . fentaNYL infusion INTRAVENOUS 125 mcg/hr (02/09/18 1100)  . ferumoxytol 510 mg (02/06/18 1622)   . heparin 900 Units/hr (02/09/18 1100)  . magnesium sulfate 1 - 4 g bolus IVPB 4 g (02/09/18 1130)  . midazolam (VERSED) infusion 2 mg/hr (02/09/18 1100)  . norepinephrine (LEVOPHED) Adult infusion 20 mcg/min (02/09/18 1100)    PRN Medications: sodium chloride, acetaminophen, bisacodyl, fentaNYL, ipratropium-albuterol, midazolam, nitroGLYCERIN, ondansetron, ondansetron (ZOFRAN) IV, polyvinyl alcohol, sodium chloride flush, sodium chloride flush    Patient Profile   [name] is a 77 year old with a history of recently diagnosed systolic heart failure earlier this month, HTN, iron deficiency,CKD III and tobacco abuse.    Admitted from [facility] for CT surgery and structural heart consultation this is complicated by marked volume overload and worsening renal function.   Assessment/Plan   1. PEA arrest x 2 on 11/28 - s/p CPR and EPI with prompt ROSC. IABP and swan placed - Remains intubated but neurologically intact   2. Acute Systolic Heart Failure -> cardiogenic shock , ICM/NICM with valvular disease .  - ECHO 01/2013 EF ~30%. Severe AS. - Now with IABP in place on NE at 8 - Output improved. Remains volume overloaded.  - Continue IV lasix as tolerated with severe AS - No BB with shock - No Arb, dig, spiro with elevated creatinine and shock .  - Continue heparin for IABP. Hgb 10.2-> 8.6 Will follow closely  3. Acute on chronic hypoxic respiratory failure s/p cardiac arrest - now intubated. CCM managing vent. On minimal support but agree with further diuresis before extubation - severe COPD at baseline with PFTs 02/06/18 FEV1 0.94L (32%), FVC 1.48 (36%) DLCO 38%  4. CAD - LHC 01/31/2018 with Ost RCA to Prox RCA lesion 100% stenosed, Prox CX lesion 70%, and mid Cx lesion 30% stenosed. Not CABG canddiate -. Continue aspirin and statin.   5. Severe Aortic Stenosis  - Structural heart team with Dr Angelena Form and Dr Roxy Manns following for possible TAVR.  - I d/w Dr. Roxy Manns today. Will continue to  pursue TAVR if/when creatinine improves. Will need pre-op CTs when renal function permits  6. AKI with CKD Stage III-IV - likely ATN -Creatinine 1.9>2.1-> 2.3 -> 2.9. Follow closely post-arrest. Continue hemodynamic support.   7. MR  - Moderate by echo. Hopefully will improve with diuresis and treatment of HF.   8. Anemia  -Iron sats 9% on 01/23/2018 .  - Received feraheme 11/26 - Hgb down 2 g/dl post-code. Remain on heparin. Transfuse hgb < 8  9. Hyponatremia.   Sodium 126->  129    10. PVCs - keep K > 4.0 Mg > 2.0  CRITICAL CARE Performed by: Glori Bickers  Total critical care time: 35 minutes  Critical care time was exclusive of separately billable procedures and treating other patients.  Critical care was necessary to treat or prevent imminent or life-threatening deterioration.  Critical care was time spent personally by me (independent of midlevel providers or residents) on the following activities: development of treatment plan with patient and/or surrogate as well as nursing, discussions with consultants, evaluation of patient's response to treatment, examination of patient, obtaining history from patient or surrogate, ordering and performing treatments and interventions, ordering and review of laboratory studies, ordering and review of radiographic studies, pulse oximetry and Cannady-evaluation of patient's condition.   Length of Stay: [place], MD  02/09/2018, 11:39 AM  Advanced Heart Failure Team Pager [phone] (M-F; 7a - 4p)  Please contact [facility] for night-coverage after hours (4p -7a ) and weekends on [email or web address]

## 2018-02-09 NOTE — Progress Notes (Signed)
Physical Therapy Discharge Patient Details Name: George Washington. MRN: [id number] DOB: 04-19-1940 Today's Date: 02/09/2018 Time:  -     Patient discharged from PT services secondary to medical decline - will need to [place]-order PT to resume therapy services.  Please see latest therapy progress note for current level of functioning and progress toward goals.    Progress and discharge plan discussed with patient and/or caregiver: Patient unable to participate in discharge planning and no caregivers available  GP     Shary Decamp [facility] 02/09/2018, 11:15 AM  [place] Pager [phone] Office [phone]

## 2018-02-09 NOTE — Progress Notes (Signed)
NAME:  George Mcenery., MRN:  [id number], DOB:  [birth year], LOS: 4 ADMISSION DATE:  02/10/2018, CONSULTATION DATE:  02/04/2018 REFERRING MD:  Ellyn Hack - Cardiology, CHIEF COMPLAINT:  Respiratory failure.   HPI/course in hospital  77 year old mand with PEA cardiac arrest 11/28  intubated and taken to cath lab for IABP. Neurologically intact, brief arrest  Patient has known severe AS with LV systolic dysfunction.   Hospital events  40/63: 77 year old mand with PEA cardiac arrest 11/28  intubated and taken to cath lab for IABP. Neurologically intact, brief arrest 11/29: weaning pressors. Still on IABP. Getting diuresis  Interim history/subjective:  No distress  Objective   Blood pressure 116/81, pulse 75, temperature 99.7 F (37.6 C), resp. rate 15, height 5\' 6"  (1.676 m), weight 74.7 kg, SpO2 96 %. PAP: (34-60)/(14-31) 41/18 CVP:  [4 mmHg-18 mmHg] 8 mmHg PCWP:  [29 mmHg] 29 mmHg CO:  [4.7 L/min-5.5 L/min] 4.7 L/min CI:  [2.6 L/min/m2-3.1 L/min/m2] 2.6 L/min/m2  Vent Mode: PRVC FiO2 (%):  [30 %-40 %] 30 % Set Rate:  [15 bmp] 15 bmp Vt Set:  [510 mL] 510 mL PEEP:  [5 cmH20] 5 cmH20 Plateau Pressure:  [18 cmH20-21 cmH20] 21 cmH20   Intake/Output Summary (Last 24 hours) at 02/09/2018 1101 Last data filed at 02/09/2018 1000 Gross per 24 hour  Intake 2063.57 ml  Output 2315 ml  Net -251.43 ml   Filed Weights   02/07/18 0519 02/03/2018 0500 02/09/18 0500  Weight: 70.7 kg 71.9 kg 74.7 kg     PAP: (34-60)/(14-31) 41/18 CVP:  [4 mmHg-18 mmHg] 8 mmHg PCWP:  [29 mmHg] 29 mmHg CO:  [4.7 L/min-5.5 L/min] 4.7 L/min CI:  [2.6 L/min/m2-3.1 L/min/m2] 2.6 L/min/m2   Physical exam    General: 77 year old white male currently resting on full ventilator support HEENT normocephalic atraumatic does have jugular venous distention orally intubated Pulmonary: Diffuse rhonchi, with expiratory wheeze Cardiac: Loud aortic murmur, regular rate and rhythm Abdomen soft not tender Extremities: Warm  and dry Neuro: Awake, follows commands, no focal deficits. GU: Clear yellow   Assessment & Plan:   S/p PEA arrest; Now in cardiogenic shock from severe AS.  ECHO 30% w/ severe AS Improved hemodynamics with IABP Plan Cont on-going hemodynamic support as directed by advanced heart failure team Team considering TAVR depending on how he does over weekend (assuming creatinine improves) Getting lasix   Acute Hypoxic respiratory failure  H/o COPD FEV1 32% at baseline Portable chest x-ray personally reviewed: Endotracheal tube satisfactory position bilateral pulmonary edema on the right greater than left support tubes and lines in satisfactory position Plan Continuing full ventilator support, would like to see hemodynamics a bit improved before weaning Scheduled bronchodilators PAD protocol RASS goal -1 Repeat chest x-ray in a.m. VAP bundle  CKD stage IV: baseline cr 1.9 -scr up to 2.92; UOP: Good Plan Strict intake output Avoid hypotension Renal dose medications Repeat chemistry a.m.  Fluid and electrolyte imbalance: hyponatremia->improving Plan Repeat a.m. Chemistry  Anemia of chronic illness No evidence of bleeding Plan And CBC transfuse for hemoglobin less than 7   Best practice:  Diet: Maintain NPO as flat Pain/Anxiety/Delirium protocol (if indicated): versed and fentanyl VAP protocol (if indicated): in place DVT prophylaxis: heparin for IABP GI prophylaxis: famotidine Glucose control: euglycemic Mobility: bedrest Code Status: DNR if [place]-arrests Family Communication: no family present Disposition: ICU   Critical care time: 32 min      Erick Colace ACNP-BC [place] Pager #  [phone] OR # [phone] if no answer

## 2018-02-09 NOTE — Progress Notes (Signed)
CBC recollected to verify H&H results.

## 2018-02-09 NOTE — Consult Note (Signed)
NAME:  George Anastasia., MRN:  [id number], DOB:  01-31-1941, LOS: 4 ADMISSION DATE:  01/28/2018, CONSULTATION DATE:  01/24/2018 REFERRING MD:  Ellyn Hack - Cardiology, CHIEF COMPLAINT:  Respiratory failure.   HPI/course in hospital  77 year old mand with PEA cardiac arrest intubated and taken to cath lab for IABP. Neurologically intact, brief arrest  Patient has known severe AS with LV systolic dysfunction.   Past Medical History  He,  has a past medical history of Arthritis, CAD (coronary artery disease), CKD (chronic kidney disease), stage III ([place]), HTN (hypertension), Iron deficiency anemia, Ischemic cardiomyopathy, Mitral regurgitation, Narcotic dependence, in remission [facility]), Retinal vein occlusion, Severe aortic stenosis, Systolic heart failure ([place]) (01/2018), and Tobacco abuse.  Past Surgical History:  Procedure Laterality Date  . cyst chest  1994   left  . EYE SURGERY    . rectal fissure  1968  . RIGHT/LEFT HEART CATH AND CORONARY/GRAFT ANGIOGRAPHY N/A 01/20/2018   Procedure: RIGHT/LEFT HEART CATH AND CORONARY/GRAFT ANGIOGRAPHY;  Surgeon: Wellington Hampshire, MD;  Location: [facility];  Service: Cardiovascular;  Laterality: N/A;  . vro       Review of Systems:   ROS  Social History   reports that he has been smoking cigarettes. He has a 15.00 pack-year smoking history. He has never used smokeless tobacco. He reports that he drinks alcohol. He reports that he does not use drugs.   Family History   His family history includes Heart attack in his father; Heart disease in his mother; Heart disease (age of onset: 74) in his sister; High Cholesterol in his brother and sister; High blood pressure in his brother and sister; Hyperlipidemia in his father; Hypertension in his father; Stroke in his father. There is no history of Colon cancer or Stomach cancer.   Allergies Allergies  Allergen Reactions  . Lisinopril     cough     Home Medications  Prior to Admission  medications   Medication Sig Start Date End Date Taking? Authorizing Provider  ALPRAZolam (XANAX) 0.25 MG tablet Take 1 tablet (0.25 mg total) by mouth 2 (two) times daily as needed for anxiety. 01/24/18  Yes Guse, Jacquelynn Cree, FNP  aspirin 81 MG chewable tablet Chew 1 tablet (81 mg total) by mouth daily. 01/29/2018  Yes Mayo, Pete Pelt, MD  aspirin-acetaminophen-caffeine [facility] MIGRAINE) [phone] MG per tablet Take 1 tablet by mouth every 6 (six) hours as needed for headache.    Yes [provider]  atorvastatin (LIPITOR) 80 MG tablet Take 1 tablet (80 mg total) by mouth daily at 6 PM. 01/14/2018  Yes Mayo, Pete Pelt, MD  furosemide (LASIX) 40 MG tablet Take 1 tablet (40 mg total) by mouth daily. Patient taking differently: Take 20 mg by mouth daily.  01/25/18 04/25/18 Yes Theora Gianotti, NP  gabapentin (NEURONTIN) 100 MG capsule Take 1 capsule (100 mg total) by mouth 3 (three) times daily. 11/06/17  Yes Rosemarie Ax, MD  Multiple Vitamins-Minerals (CENTRUM SILVER PO) Take 1 tablet by mouth daily.   Yes [provider]  traMADol (ULTRAM) 50 MG tablet Take 1 tablet (50 mg total) by mouth every 8 (eight) hours as needed. 09/27/17  Yes Lucille Passy, MD  traZODone (DESYREL) 50 MG tablet TAKE 0.5-1 TABLET EVERY NIGHT AT BEDTIME AS NEEDED FOR SLEEP 12/12/17  Yes Lucille Passy, MD     Interim history/subjective:  Awake and follow commands. No complaints.  Objective   Blood pressure 118/78, pulse 79,  temperature 99.9 F (37.7 C), resp. rate 15, height 5\' 6"  (1.676 m), weight 71.9 kg, SpO2 99 %. PAP: (35-60)/(17-31) 35/17 CVP:  [7 mmHg-25 mmHg] 7 mmHg PCWP:  [18 mmHg] 18 mmHg CO:  [4.7 L/min-5.5 L/min] 5.5 L/min CI:  [2.6 L/min/m2-3.1 L/min/m2] 3.1 L/min/m2  Vent Mode: PRVC FiO2 (%):  [40 %-100 %] 40 % Set Rate:  [15 bmp] 15 bmp Vt Set:  [510 mL] 510 mL PEEP:  [5 cmH20] 5 cmH20 Plateau Pressure:  [17 cmH20-21 cmH20] 18 cmH20   Intake/Output Summary (Last 24 hours)  at 02/09/2018 0038 Last data filed at 02/09/2018 0000 Gross per 24 hour  Intake 1449.43 ml  Output 1270 ml  Net 179.43 ml   Filed Weights   02/06/18 0528 02/07/18 0519 02/10/2018 0500  Weight: 71.3 kg 70.7 kg 71.9 kg    Examination: Physical Exam  Constitutional: He appears well-developed. He is sleeping.  Non-toxic appearance. No distress. He is sedated and intubated.  Supine with IABP in place.  HENT:  ETT and OGT in place  Neck: No JVD present.  Cardiovascular: Normal rate, regular rhythm, S1 normal and S2 normal. Exam reveals gallop and S4.  Murmur heard.  Crescendo decrescendo systolic murmur is present with a grade of 5/6. Hands warm. Toes cool bilaterally.  R femoral IABP.  Respiratory: Effort normal and breath sounds normal. He is intubated.  GI: Soft. Normal appearance. There is no tenderness.  Genitourinary:  Genitourinary Comments: Foley in place.  Neurological: He is unresponsive.  No response to painful stimuli    PAP: (35-60)/(17-31) 35/17 CVP:  [7 mmHg-25 mmHg] 7 mmHg PCWP:  [18 mmHg] 18 mmHg CO:  [4.7 L/min-5.5 L/min] 5.5 L/min CI:  [2.6 L/min/m2-3.1 L/min/m2] 3.1 L/min/m2  (personnally performed and reviewed)  Ancillary tests (personally reviewed)  CBC: Recent Labs  Lab 02/06/18 0636 02/07/18 0424 02/04/2018 0511  WBC 4.9 5.3 7.4  NEUTROABS 3.2 3.3 3.4  HGB 9.4* 10.2* 10.4*  HCT 30.5* 32.6* 34.3*  MCV 82.7 83.8 84.5  PLT 209 200 627    Basic Metabolic Panel: Recent Labs  Lab 02/04/18 0904 02/04/18 1603 02/01/2018 0007 02/06/18 0126 02/07/18 0424 01/22/2018 0511  NA 124* 124* 126* 128* 130* 126*  K 5.5*  --  4.1 4.0 3.8 4.9  CL 81*  --  81* 83* 83* 82*  CO2 28  --  31 31 33* 28  GLUCOSE 161*  --  134* 129* 130* 164*  BUN 43*  --  41* 39* 37* 39*  CREATININE 2.05*  --  1.90* 2.07* 2.11* 2.33*  CALCIUM 9.7  --  9.1 9.5 9.5 9.1  MG  --   --   --   --   --  2.4   GFR: Estimated Creatinine Clearance: 24 mL/min (A) (by C-G formula based on SCr  of 2.33 mg/dL (H)). Recent Labs  Lab 02/06/18 0636 02/07/18 0424 02/10/2018 0511 02/04/2018 0934  WBC 4.9 5.3 7.4  --   LATICACIDVEN  --   --  5.6* 1.1    Liver Function Tests: Recent Labs  Lab 02/06/18 0126  AST 39  ALT 38  ALKPHOS 63  BILITOT 1.0  PROT 6.8  ALBUMIN 3.5   No results for input(s): LIPASE, AMYLASE in the last 168 hours. No results for input(s): AMMONIA in the last 168 hours.  ABG    Component Value Date/Time   PHART 7.407 02/01/2018 0550   PCO2ART 56.5 (H) 01/23/2018 0550   PO2ART 317.0 (H) 01/23/2018 0550  HCO3 35.6 (H) 01/20/2018 0550   TCO2 37 (H) 01/15/2018 0550   O2SAT 46.8 02/05/2018 0550   O2SAT 100.0 01/26/2018 0550     Coagulation Profile: Recent Labs  Lab 01/17/2018 0511  INR 1.13    Cardiac Enzymes: Recent Labs  Lab 02/06/18 0126 02/06/18 0636 02/10/2018 0511 02/10/2018 1129 01/19/2018 1810  TROPONINI 0.09* 0.08* 0.61* 2.47* 8.27*    HbA1C: Hgb A1c MFr Bld  Date/Time Value Ref Range Status  02/06/2018 01:26 AM 5.8 (H) 4.8 - 5.6 % Final    Comment:    (NOTE) Pre diabetes:          5.7%-6.4% Diabetes:              >6.4% Glycemic control for   <7.0% adults with diabetes     CBG: Recent Labs  Lab 02/07/18 0748 01/29/2018 0509  GLUCAP 93 168*     Assessment & Plan:  Critically ill due cardiogenic shock from severe AS. Improved hemodynamics with IABP - weaned off vapressor support. Critically ill due to respiratory failure -   Plan: Continue current ventilation support until IABP removed. Continue IABP Titrate sedation to comfort.  Daily wake out assessment  Best practice:  Diet: Maintain NPO as flat Pain/Anxiety/Delirium protocol (if indicated): versed and fentanyl VAP protocol (if indicated): in place DVT prophylaxis: heparin for IABP GI prophylaxis: famotidine Glucose control: euglycemic Mobility: bedrest Code Status: DNR if [place]-arrests Family Communication: no family present Disposition: ICU   Critical care  time: 35 min.     Kipp Brood, MD [facility] ICU Physician [place]  Pager: [phone] Mobile: [phone] After hours: [phone].

## 2018-02-09 NOTE — Progress Notes (Signed)
Pharmacy Antibiotic Note  George Washington. is a 77 y.o. male admitted on 02/10/2018 with concerns for possible PNA.  Pharmacy has been consulted for vancomycin/cefepime dosing. Tmax 100, WBC 9.6. Obtaining blood/respiratory cultures. Renal function has been declining, Scr 1.9>>2.33>2.92.  Plan:  - Vancomycin 1500 mg IV x1. Plan to check random level Sunday to ensure adequate clearance. Anticipate q48 hour dosing pending no improvement in renal function. Goal trough 15-20 mcg/mL. - Cefepime 1g IV q24h  Height: 5\' 6"  (167.6 cm) Weight: 164 lb 10.9 oz (74.7 kg) IBW/kg (Calculated) : 63.8  Temp (24hrs), Avg:99.7 F (37.6 C), Min:98.8 F (37.1 C), Max:100 F (37.8 C)  Recent Labs  Lab 02/09/2018 0007 02/06/18 0126  02/07/18 0424 01/20/2018 0511 01/31/2018 0934 02/09/18 0425 02/09/18 0548 02/09/18 1315  WBC  --   --    < > 5.3 7.4  --  8.3 8.4 9.6  CREATININE 1.90* 2.07*  --  2.11* 2.33*  --  2.92*  --   --   LATICACIDVEN  --   --   --   --  5.6* 1.1  --   --   --    < > = values in this interval not displayed.    Estimated Creatinine Clearance: 19.1 mL/min (A) (by C-G formula based on SCr of 2.92 mg/dL (H)).    Allergies  Allergen Reactions  . Lisinopril     cough    Antimicrobials this admission: Cefepime 11/29 >> Vancomycin 11/29 >>   Dose adjustments this admission:   Microbiology results: 11/29 BCx: sent 11/29 Sputum: sent 11/28 MRSA PCR: negative  Thank you for allowing pharmacy to be a part of this patient's care.  Claiborne Billings, PharmD PGY2 Cardiology Pharmacy Resident Phone [phone] Please check AMION for all Pharmacist numbers by unit 02/09/2018 2:31 PM

## 2018-02-09 NOTE — Progress Notes (Signed)
[place] for Heparin Indication: IABP balloon pump  Allergies  Allergen Reactions  . Lisinopril     cough    Patient Measurements: Height: _0  (167.6 cm) Weight: 158 lb 8.2 oz (71.9 kg) IBW/kg (Calculated) : 63.8 HEPARIN DW (KG): 71.4  Vital Signs: Temp: 99.7 F (37.6 C) (11/29 0530) Temp Source: Core (11/29 0400) BP: 110/54 (11/29 0500) Pulse Rate: 83 (11/29 0530)  Labs: Recent Labs    02/07/18 0424 01/27/2018 0511 01/25/2018 1129 02/01/2018 1700 02/07/2018 1810 02/09/18 0425  HGB 10.2* 10.4*  --   --   --  8.6*  HCT 32.6* 34.3*  --   --   --  26.4*  PLT 200 192  --   --   --  192  LABPROT  --  14.4  --   --   --   --   INR  --  1.13  --   --   --   --   HEPARINUNFRC  --   --   --  0.18*  --  0.10*  CREATININE 2.11* 2.33*  --   --   --  2.92*  TROPONINI  --  0.61* 2.47*  --  8.27*  --     Estimated Creatinine Clearance: 19.1 mL/min (A) (by C-G formula based on SCr of 2.92 mg/dL (H)).   Medical History: Past Medical History:  Diagnosis Date  . Arthritis   . CAD (coronary artery disease)   . CKD (chronic kidney disease), stage III ([place])   . HTN (hypertension)   . Iron deficiency anemia   . Ischemic cardiomyopathy   . Mitral regurgitation   . Narcotic dependence, in remission ([place])   . Retinal vein occlusion   . Severe aortic stenosis   . Systolic heart failure ([place]) 01/2018  . Tobacco abuse     Medications:  Scheduled:  . aspirin EC  81 mg Oral Daily  . atorvastatin  80 mg Oral q1800  . budesonide (PULMICORT) nebulizer solution  0.25 mg Nebulization BID  . chlorhexidine gluconate (MEDLINE KIT)  15 mL Mouth Rinse BID  . furosemide  80 mg Intravenous [id number]  . mouth rinse  15 mL Mouth Rinse 10 times per day  . nicotine  21 mg Transdermal Daily  . senna-docusate  1 tablet Oral BID  . sodium chloride flush  10-40 mL Intracatheter Q12H  . sodium chloride flush  3 mL Intravenous Q12H  . traZODone  100 mg Oral QHS    Infusions:  . sodium chloride 20 mL/hr at 02/09/18 0500  . famotidine (PEPCID) IV Stopped (01/13/2018 1806)  . fentaNYL infusion INTRAVENOUS 125 mcg/hr (02/09/18 0500)  . ferumoxytol 510 mg (02/06/18 1622)  . heparin 850 Units/hr (02/09/18 0500)  . midazolam (VERSED) infusion 2 mg/hr (02/09/18 0500)  . norepinephrine (LEVOPHED) Adult infusion 6 mcg/min (02/09/18 0500)    Assessment: Pt is a 36 yoM s/p PEA arrest this morning who went for balloon pump. Pharmacy has been consulted for heparin management.  Heparin level low at 0.1 for lower goal with IABP. Hemoglobin down to 8.6 , plt stable 192. No bleeding or issues with infusion per discussion with RN.    Goal of Therapy:  Heparin level 0.2-0.5 units/ml Monitor platelets by anticoagulation protocol: Yes   Plan:  Increase heparin infusion slightly to 900 units/hr 8h heparin level Monitor daily heparin level and CBC, s/sx bleeding  Thanks for allowing pharmacy to be a part of this patient's care.  Excell Seltzer, PharmD Clinical Pharmacist

## 2018-02-09 NOTE — Plan of Care (Signed)
  Problem: Clinical Measurements: Goal: Will remain free from infection Outcome: Not Progressing   Problem: Activity: Goal: Risk for activity intolerance will decrease Outcome: Not Progressing   Problem: Nutrition: Goal: Adequate nutrition will be maintained Outcome: Progressing   Problem: Coping: Goal: Level of anxiety will decrease Outcome: Progressing   Problem: Pain Managment: Goal: General experience of comfort will improve Outcome: Progressing   Problem: Cardiac: Goal: Ability to achieve and maintain adequate cardiopulmonary perfusion will improve Outcome: Progressing

## 2018-02-09 NOTE — Progress Notes (Signed)
[place] for Heparin Indication: IABP balloon pump  Allergies  Allergen Reactions  . Lisinopril     cough    Patient Measurements: Height: '5\' 6"'  (167.6 cm) Weight: 164 lb 10.9 oz (74.7 kg) IBW/kg (Calculated) : 63.8 HEPARIN DW (KG): 74.7  Vital Signs: Temp: 99.7 F (37.6 C) (11/29 0700) Temp Source: Core (11/29 0400) BP: 126/54 (11/29 0700) Pulse Rate: 77 (11/29 0700)  Labs: Recent Labs    02/07/18 0424 02/04/2018 0511 01/13/2018 1129 01/30/2018 1700 01/30/2018 1810 02/09/18 0425 02/09/18 0548  HGB 10.2* 10.4*  --   --   --  8.6* 8.6*  HCT 32.6* 34.3*  --   --   --  26.4* 28.1*  PLT 200 192  --   --   --  192 209  LABPROT  --  14.4  --   --   --   --   --   INR  --  1.13  --   --   --   --   --   HEPARINUNFRC  --   --   --  0.18*  --  0.10*  --   CREATININE 2.11* 2.33*  --   --   --  2.92*  --   TROPONINI  --  0.61* 2.47*  --  8.27*  --   --     Estimated Creatinine Clearance: 19.1 mL/min (A) (by C-G formula based on SCr of 2.92 mg/dL (H)).   Medical History: Past Medical History:  Diagnosis Date  . Arthritis   . CAD (coronary artery disease)   . CKD (chronic kidney disease), stage III ([place])   . HTN (hypertension)   . Iron deficiency anemia   . Ischemic cardiomyopathy   . Mitral regurgitation   . Narcotic dependence, in remission ([place])   . Retinal vein occlusion   . Severe aortic stenosis   . Systolic heart failure ([place]) 01/2018  . Tobacco abuse     Medications:  Scheduled:  . aspirin EC  81 mg Oral Daily  . atorvastatin  80 mg Oral q1800  . budesonide (PULMICORT) nebulizer solution  0.25 mg Nebulization BID  . chlorhexidine gluconate (MEDLINE KIT)  15 mL Mouth Rinse BID  . furosemide  80 mg Intravenous [id number]  . mouth rinse  15 mL Mouth Rinse 10 times per day  . nicotine  21 mg Transdermal Daily  . potassium chloride  20 mEq Oral Once  . senna-docusate  1 tablet Oral BID  . sodium chloride flush  10-40 mL  Intracatheter Q12H  . sodium chloride flush  3 mL Intravenous Q12H  . traZODone  100 mg Oral QHS   Infusions:  . sodium chloride 10 mL/hr at 02/09/18 0700  . famotidine (PEPCID) IV Stopped (01/30/2018 1806)  . fentaNYL infusion INTRAVENOUS 150 mcg/hr (02/09/18 0700)  . ferumoxytol 510 mg (02/06/18 1622)  . heparin 900 Units/hr (02/09/18 0700)  . midazolam (VERSED) infusion 2 mg/hr (02/09/18 0700)  . norepinephrine (LEVOPHED) Adult infusion 8 mcg/min (02/09/18 0700)    Assessment: Pt is a 53 yoM s/p PEA arrest this morning who went for balloon pump. Pharmacy has been consulted for heparin management.  Heparin level low at 0.15 for lower goal with IABP. Hemoglobin down but stable 8.8 , plt stable 181. No bleeding or issues with infusion per discussion with RN.    Goal of Therapy:  Heparin level 0.2-0.5 units/ml Monitor platelets by anticoagulation protocol: Yes   Plan:  Increase heparin  infusion slightly to 1050 units/hr 8h heparin level Monitor daily heparin level and CBC, s/sx bleeding  Thanks for allowing pharmacy to be a part of this patient's care.  Erin Hearing PharmD., BCPS Clinical Pharmacist 02/09/2018 8:35 AM

## 2018-02-09 NOTE — Progress Notes (Signed)
Initial Nutrition Assessment  INTERVENTION:  If unable to extubate pt, recommend placement of post-pyloric Cortrak tube and initiation of enteral nutrition: - Vital AF 1.2 @ 55 ml/hr (1320 ml/day)  Tube feeding regimen provides 1584 kcal, 99 grams of protein, and 1069 ml of H2O (97% of needs).  NUTRITION DIAGNOSIS:   Inadequate oral intake related to inability to eat as evidenced by NPO status.  GOAL:   Patient will meet greater than or equal to 90% of their needs  MONITOR:   Vent status, Skin, Weight trends, Labs, I & O's  REASON FOR ASSESSMENT:   Ventilator    ASSESSMENT:   77 year old male who presented on 11/25 for a right and left cardiac cath which revealed severe aortic stenosis and significant two-vessel CAD. PMH significant for recently diagnosed CHF, hypertension, CKD stage III, and tobacco use. Pt was [facility] to [facility] for consideration of AVR and 2 vessel CABG vs TAVR.  11/28 - intubated after Code Blue x 2 and PEA arrest, IABP and Swan placed  Plan is to pursue an aggressive approach and possible TAVR next week.  Discussed pt with RN. RD to leave TF recommendations if unable to extubate pt. Due to pt lying flat, would recommend placing post-pyloric Cortrak feeding tube for enteral nutrition access.  Weight up to 74.7 kg from 71.4 kg on admission. Will use 70.7 kg as EDW (measured on 11/27).  Patient is currently intubated on ventilator support MVe: 7.7 L/min Temp (24hrs), Avg:99.7 F (37.6 C), Min:98.8 F (37.1 C), Max:100 F (37.8 C) BP (a-line): 82/46 MAP (a-line): 77  Fentanyl: 10 ml/hr Heparin: 9 ml/hr NaCl: 10 ml/hr Levophed: 18.8 ml/hr Versed: 0.5 ml/hr  Medications reviewed and include: Colace, Pepcid, Lasix 80 mg TID, Senokot  Labs reviewed: BUN 42 (H), creatinine 2.92 (H)  UOP: 1740 ml x 24 hours I/O's: -3.6 L since admission  NUTRITION - FOCUSED PHYSICAL EXAM:    Most Recent Value  Orbital Region  No depletion  Upper  Arm Region  Mild depletion  Thoracic and Lumbar Region  Mild depletion  Buccal Region  Unable to assess  Temple Region  No depletion  Clavicle Bone Region  No depletion  Clavicle and Acromion Bone Region  Mild depletion  Scapular Bone Region  Unable to assess  Dorsal Hand  No depletion  Patellar Region  No depletion  Anterior Thigh Region  No depletion  Posterior Calf Region  No depletion  Edema (RD Assessment)  Moderate [BLE]  Hair  Reviewed  Eyes  Unable to assess  Mouth  Unable to assess  Skin  Reviewed  Nails  Reviewed       Diet Order:   Diet Order            Diet NPO time specified  Diet effective now              EDUCATION NEEDS:   Not appropriate for education at this time  Skin:  Skin Assessment: Reviewed RN Assessment  Last BM:  11/25  Height:   Ht Readings from Last 1 Encounters:  02/09/18 5\' 6"  (1.676 m)    Weight:   Wt Readings from Last 1 Encounters:  02/09/18 74.7 kg    Ideal Body Weight:  64.55 kg  BMI:  Body mass index is 26.58 kg/m.  Estimated Nutritional Needs:   Kcal:  8101  Protein:  85-100 grams  Fluid:  >/= 1.6 L    Gaynell Face, MS, RD, LDN Inpatient Clinical Dietitian  Pager: [phone] Weekend/After Hours: [phone]

## 2018-02-10 ENCOUNTER — Inpatient Hospital Stay (HOSPITAL_COMMUNITY): Payer: Medicare Other

## 2018-02-10 LAB — CBC WITH DIFFERENTIAL/PLATELET
Abs Immature Granulocytes: 0.01 10*3/uL (ref 0.00–0.07)
BASOS ABS: 0 10*3/uL (ref 0.0–0.1)
Basophils Relative: 0 %
EOS ABS: 0.2 10*3/uL (ref 0.0–0.5)
Eosinophils Relative: 3 %
HCT: 29.8 % — ABNORMAL LOW (ref 39.0–52.0)
Hemoglobin: 9.1 g/dL — ABNORMAL LOW (ref 13.0–17.0)
Immature Granulocytes: 0 %
Lymphocytes Relative: 9 %
Lymphs Abs: 0.7 10*3/uL (ref 0.7–4.0)
MCH: 25.1 pg — ABNORMAL LOW (ref 26.0–34.0)
MCHC: 30.5 g/dL (ref 30.0–36.0)
MCV: 82.1 fL (ref 80.0–100.0)
Monocytes Absolute: 0.6 10*3/uL (ref 0.1–1.0)
Monocytes Relative: 9 %
NRBC: 0 % (ref 0.0–0.2)
Neutro Abs: 5.8 10*3/uL (ref 1.7–7.7)
Neutrophils Relative %: 79 %
Platelets: 191 10*3/uL (ref 150–400)
RBC: 3.63 MIL/uL — ABNORMAL LOW (ref 4.22–5.81)
RDW: 16.6 % — AB (ref 11.5–15.5)
WBC: 7.4 10*3/uL (ref 4.0–10.5)

## 2018-02-10 LAB — BASIC METABOLIC PANEL
Anion gap: 11 (ref 5–15)
BUN: 43 mg/dL — ABNORMAL HIGH (ref 8–23)
CO2: 31 mmol/L (ref 22–32)
Calcium: 8.4 mg/dL — ABNORMAL LOW (ref 8.9–10.3)
Chloride: 89 mmol/L — ABNORMAL LOW (ref 98–111)
Creatinine, Ser: 2.6 mg/dL — ABNORMAL HIGH (ref 0.61–1.24)
GFR calc Af Amer: 26 mL/min — ABNORMAL LOW (ref >60)
GFR calc non Af Amer: 23 mL/min — ABNORMAL LOW (ref >60)
Glucose, Bld: 136 mg/dL — ABNORMAL HIGH (ref 70–99)
Potassium: 3.4 mmol/L — ABNORMAL LOW (ref 3.5–5.1)
Sodium: 131 mmol/L — ABNORMAL LOW (ref 135–145)

## 2018-02-10 LAB — COOXEMETRY PANEL
Carboxyhemoglobin: 2 % — ABNORMAL HIGH (ref 0.5–1.5)
Methemoglobin: 2 % — ABNORMAL HIGH (ref 0.0–1.5)
O2 Saturation: 54.2 %
Total hemoglobin: 9.3 g/dL — ABNORMAL LOW (ref 12.0–16.0)

## 2018-02-10 LAB — HEPARIN LEVEL (UNFRACTIONATED)
HEPARIN UNFRACTIONATED: 0.15 [IU]/mL — AB (ref 0.30–0.70)
Heparin Unfractionated: 0.27 IU/mL — ABNORMAL LOW (ref 0.30–0.70)

## 2018-02-10 LAB — PROCALCITONIN: Procalcitonin: 0.23 ng/mL

## 2018-02-10 MED ORDER — TRAZODONE HCL 50 MG PO TABS
100.0000 mg | ORAL_TABLET | Freq: Every day | ORAL | Status: DC
  Administered 2018-02-10 – 2018-02-13 (×3): 100 mg
  Filled 2018-02-10 (×2): qty 2

## 2018-02-10 MED ORDER — SODIUM CHLORIDE 0.9 % IV BOLUS
250.0000 mL | Freq: Once | INTRAVENOUS | Status: AC
  Administered 2018-02-10: 250 mL via INTRAVENOUS

## 2018-02-10 MED ORDER — SODIUM CHLORIDE 0.9 % IV SOLN
INTRAVENOUS | Status: DC | PRN
  Administered 2018-02-10: 1000 mL via INTRAVENOUS
  Administered 2018-02-12: 250 mL via INTRAVENOUS

## 2018-02-10 MED ORDER — SODIUM CHLORIDE 0.9 % IV BOLUS
250.0000 mL | Freq: Once | INTRAVENOUS | Status: AC
  Administered 2018-02-10: 06:00:00 via INTRAVENOUS

## 2018-02-10 MED ORDER — SODIUM CHLORIDE 0.9 % IV SOLN
INTRAVENOUS | Status: DC | PRN
  Administered 2018-02-10: 500 mL via INTRAVENOUS
  Administered 2018-02-12: 1000 mL via INTRAVENOUS

## 2018-02-10 MED ORDER — MAGNESIUM SULFATE 2 GM/50ML IV SOLN
2.0000 g | Freq: Once | INTRAVENOUS | Status: AC
  Administered 2018-02-10: 2 g via INTRAVENOUS
  Filled 2018-02-10: qty 50

## 2018-02-10 MED ORDER — POTASSIUM CHLORIDE 20 MEQ/15ML (10%) PO SOLN
40.0000 meq | Freq: Once | ORAL | Status: AC
  Administered 2018-02-10: 40 meq via ORAL
  Filled 2018-02-10: qty 30

## 2018-02-10 MED ORDER — SODIUM CHLORIDE 0.9 % IV SOLN
INTRAVENOUS | Status: DC | PRN
  Administered 2018-02-10: 250 mL via INTRAVENOUS
  Administered 2018-02-12: 500 mL via INTRAVENOUS

## 2018-02-10 NOTE — Progress Notes (Signed)
NAME:  George Colston., MRN:  [id number], DOB:  [birth year], LOS: 5 ADMISSION DATE:  01/18/2018, CONSULTATION DATE:  01/31/2018 REFERRING MD:  Ellyn Hack - Cardiology, CHIEF COMPLAINT:  Respiratory failure.   HPI/course in hospital  77 year old mand with PEA cardiac arrest 11/28  intubated and taken to cath lab for IABP. Neurologically intact, brief arrest  Patient has known severe AS with LV systolic dysfunction.   Hospital events  7/29: 77 year old mand with PEA cardiac arrest 11/28  intubated and taken to cath lab for IABP. Neurologically intact, brief arrest 11/29: weaning pressors. Still on IABP. Getting diuresis  11/30: Renal function slightly improved. Minimally net negative. Still requiring pressors. Interim history/subjective:  No acute events. Unable to wean pressors due to hypotension.  Objective   Blood pressure 130/71, pulse 82, temperature 99.9 F (37.7 C), resp. rate 15, height 5\' 6"  (1.676 m), weight 71 kg, SpO2 97 %. PAP: (28-63)/(14-32) 44/21 CVP:  [4 mmHg-19 mmHg] 7 mmHg PCWP:  [23 mmHg] 23 mmHg CO:  [4.7 L/min-4.8 L/min] 4.8 L/min CI:  [2.6 L/min/m2-2.7 L/min/m2] 2.7 L/min/m2  Vent Mode: PRVC FiO2 (%):  [30 %] 30 % Set Rate:  [15 bmp] 15 bmp Vt Set:  [510 mL] 510 mL PEEP:  [5 cmH20] 5 cmH20 Plateau Pressure:  [17 cmH20-21 cmH20] 19 cmH20   Intake/Output Summary (Last 24 hours) at 02/10/2018 1058 Last data filed at 02/10/2018 1030 Gross per 24 hour  Intake 3395.1 ml  Output 5815 ml  Net -2419.9 ml   Filed Weights   01/25/2018 0500 02/09/18 0500 02/10/18 0500  Weight: 71.9 kg 74.7 kg 71 kg     PAP: (28-63)/(14-32) 44/21 CVP:  [4 mmHg-19 mmHg] 7 mmHg PCWP:  [23 mmHg] 23 mmHg CO:  [4.7 L/min-4.8 L/min] 4.8 L/min CI:  [2.6 L/min/m2-2.7 L/min/m2] 2.7 L/min/m2   Physical exam   Vitals reviewed in Flowsheet General: sedated, appears comfortable HEENT normocephalic atraumatic does have jugular venous distention orally intubated Pulmonary: Ventilated  breath sounds. Cardiac: IABP sounds heard Abdomen soft not tender Extremities: Warm and dry Neuro: Sedated GU: Clear yellow urine with foley in place   Assessment & Plan:   S/p PEA arrest; Now in cardiogenic shock from severe AS.  ECHO 30% w/ severe AS H/o CAD Improved hemodynamics with IABP Plan Cont on-going hemodynamic support as directed by advanced heart failure team Team considering TAVR depending on how he does over weekend (assuming creatinine improves) Getting lasix 80 mg TID Aspirin, Statin  Acute Hypoxic respiratory failure  H/o COPD FEV1 32% at baseline Portable chest x-ray personally reviewed: ETT stable. Edema slightly improved. Plan Continuing full ventilator support, would like to see hemodynamics a bit improved before weaning and further diuresis prior to extubation. Scheduled bronchodilators PAD protocol RASS goal -1 CXR daily with PA cath in place VAP bundle  AKI with CKD stage IV: baseline cr 1.9 -Cr trending down, 2.6 today. UOP good. Plan Diuresis as aboe Strict intake output Renal dose medications  Fluid and electrolyte imbalance: hypervolemic hyponatremia->improving with diuresis,  Plan Daily BMP Goal K > 4, Mag > 2  Anemia of chronic illness No evidence of bleeding, Hgb improved today S/p Feraheme 11/29 Plan Daily CBC, transfuse for Hgb < 7   Best practice:  Diet: Maintain NPO as flat Pain/Anxiety/Delirium protocol (if indicated): versed and fentanyl VAP protocol (if indicated): in place DVT prophylaxis: heparin for IABP GI prophylaxis: famotidine Glucose control: euglycemic Mobility: bedrest Code Status: DNR if [place]-arrests Family Communication: no  family present Disposition: ICU   Critical care time: 24 min     Marlise Eves, MD PCCM

## 2018-02-10 NOTE — Progress Notes (Signed)
[place] for Heparin Indication: IABP balloon pump  Allergies  Allergen Reactions  . Lisinopril     cough    Patient Measurements: Height: 5\' 6"  (167.6 cm) Weight: 156 lb 8.4 oz (71 kg) IBW/kg (Calculated) : 63.8 HEPARIN DW (KG): 74.7  Vital Signs: Temp: 99.9 F (37.7 C) (11/30 1000) Temp Source: Core (11/30 0400) BP: 130/71 (11/30 1000) Pulse Rate: 82 (11/30 1000)  Labs: Recent Labs    01/19/2018 0511 02/05/2018 1129  01/29/2018 1810 02/09/18 0425 02/09/18 0548 02/09/18 1315 02/09/18 2251 02/10/18 0256 02/10/18 0952  HGB 10.4*  --   --   --  8.6* 8.6* 8.8*  --  9.1*  --   HCT 34.3*  --   --   --  26.4* 28.1* 27.7*  --  29.8*  --   PLT 192  --   --   --  192 209 181  --  191  --   LABPROT 14.4  --   --   --   --   --   --   --   --   --   INR 1.13  --   --   --   --   --   --   --   --   --   HEPARINUNFRC  --   --    < >  --  0.10*  --  0.15* 0.18*  --  0.15*  CREATININE 2.33*  --   --   --  2.92*  --   --   --  2.60*  --   TROPONINI 0.61* 2.47*  --  8.27*  --   --   --   --   --   --    < > = values in this interval not displayed.    Estimated Creatinine Clearance: 21.5 mL/min (A) (by C-G formula based on SCr of 2.6 mg/dL (H)).  Assessment: Pt is a 31 yoM s/p PEA arrest this morning who went for balloon pump. Pharmacy has been consulted for heparin management.  Heparin level remains low at 0.15 units/ml (for lower goal with IABP) on heparin 1150 units/hr. Hb 9.1. No signs/symptoms of bleeding or issues with infusion reported by nursing.   Goal of Therapy:  Heparin level 0.2-0.5 units/ml Monitor platelets by anticoagulation protocol: Yes   Plan:  Increase heparin infusion to 1300 units/hr 8h heparin level Monitor daily heparin level and CBC, s/sx bleeding  Thanks for allowing pharmacy to be a part of this patient's care.  Claiborne Billings, PharmD PGY2 Cardiology Pharmacy Resident Phone [phone] Please check AMION  for all Pharmacist numbers by unit 02/10/2018 10:55 AM

## 2018-02-10 NOTE — Progress Notes (Signed)
This RN did not wedge Swan for hemodynamic calculations, used PAD to calculate

## 2018-02-10 NOTE — Progress Notes (Addendum)
Advanced Heart Failure Rounding Note  PCP-Cardiologist: George Sacramento, George Washington   Subjective:    Had PEA arrest on 11/28. Code blue x 2. Had CPR and epi with prompt ROSC. Co-ox pre-code 47% Intubated and taken to cath lab. IABP and swan placed.   On 11/29 developed fevers and thick tan secretions concerning for PNA. Cultures drawn. Started on vanc and cefipime.   Awake on vent. NE up from 8 -> 25. Co-ox 54%. Creatinine 2.3-> 2.9 -> 2.6   WBC 7.4. CXR decreased edema. No infiltrate. Tmax 100.8. BCx NGTD. Sputum GS: Moderate GPC in clusters   Swan numbers done personally  CVP 6-7 PA 45/24 PCWP 22 Thermo 4.8/2.7 Co-ox 54.2%   Objective:   Weight Range: 71 kg Body mass index is 25.26 kg/m.   Vital Signs:   Temp:  [99.5 F (37.5 C)-100.8 F (38.2 C)] 99.9 F (37.7 C) (11/30 1000) Pulse Rate:  [77-159] 82 (11/30 1000) Resp:  [13-25] 15 (11/30 1000) BP: (92-146)/(47-107) 130/71 (11/30 1000) SpO2:  [92 %-100 %] 97 % (11/30 1000) Arterial Line BP: (86-136)/(35-76) 118/45 (11/30 1000) FiO2 (%):  [30 %] 30 % (11/30 0857) Weight:  [71 kg] 71 kg (11/30 0500) Last BM Date: 01/17/2018  Weight change: Filed Weights   01/19/2018 0500 02/09/18 0500 02/10/18 0500  Weight: 71.9 kg 74.7 kg 71 kg    Intake/Output:   Intake/Output Summary (Last 24 hours) at 02/10/2018 1107 Last data filed at 02/10/2018 1030 Gross per 24 hour  Intake 3345.99 ml  Output 5640 ml  Net -2294.01 ml      Physical Exam    General:  Intubated. Awake on vent. No resp difficulty HEENT: normal + ETT Neck: supple. JVP 7. Carotids 2+ bilat; no bruits. No lymphadenopathy or thryomegaly appreciated. Cor: PMI nondisplaced. Regular rate & rhythm. Soft AS murmur Lungs: coarse Abdomen: soft, nontender, nondistended. No hepatosplenomegaly. No bruits or masses. Good bowel sounds. Extremities: no cyanosis, clubbing, rash, edema L groin IABP swan Neuro: awake on vent. Follows commands   Telemetry   Sinus 80s  Frequent PVCs Personally reviewed  EKG    N/a   Labs    CBC Recent Labs    02/09/18 0425  02/09/18 1315 02/10/18 0256  WBC 8.3   < > 9.6 7.4  NEUTROABS 6.3  --   --  5.8  HGB 8.6*   < > 8.8* 9.1*  HCT 26.4*   < > 27.7* 29.8*  MCV 80.7   < > 82.2 82.1  PLT 192   < > 181 191   < > = values in this interval not displayed.   Basic Metabolic Panel Recent Labs    01/23/2018 0511 02/09/18 0425 02/09/18 0938 02/10/18 0256  NA 126* 129*  --  131*  K 4.9 3.7  --  3.4*  CL 82* 87*  --  89*  CO2 28 30  --  31  GLUCOSE 164* 85  --  136*  BUN 39* 42*  --  43*  CREATININE 2.33* 2.92*  --  2.60*  CALCIUM 9.1 8.4*  --  8.4*  MG 2.4  --  1.7  --    Liver Function Tests No results for input(s): AST, ALT, ALKPHOS, BILITOT, PROT, ALBUMIN in the last 72 hours. No results for input(s): LIPASE, AMYLASE in the last 72 hours. Cardiac Enzymes Recent Labs    01/31/2018 0511 01/19/2018 1129 01/20/2018 1810  TROPONINI 0.61* 2.47* 8.27*    BNP: BNP (last 3 results) Recent  Labs    02/06/18 0126 01/21/2018 0511  BNP >4,500.0* 2,385.1*    ProBNP (last 3 results) No results for input(s): PROBNP in the last 8760 hours.   D-Dimer No results for input(s): DDIMER in the last 72 hours. Hemoglobin A1C No results for input(s): HGBA1C in the last 72 hours. Fasting Lipid Panel No results for input(s): CHOL, HDL, LDLCALC, TRIG, CHOLHDL, LDLDIRECT in the last 72 hours. Thyroid Function Tests No results for input(s): TSH, T4TOTAL, T3FREE, THYROIDAB in the last 72 hours.  Invalid input(s): FREET3  Other results:   Imaging    Dg Chest Port 1 View  Result Date: 02/10/2018 CLINICAL DATA:  Pulmonary edema diagnosis. I clipped the right posterior costophrenic hence the lower second image. EXAM: PORTABLE CHEST - 1 VIEW COMPARISON:  02/03/2018 FINDINGS: Endotracheal tube stable Nasogastric tube extends into the decompressed stomach Right arm PICC stable Femoral Swan-Ganz catheter into a lower lobe  branch of the right pulmonary artery IABP tip below the aortic arch Mild central pulmonary vascular congestion with some improvement in the interstitial edema suspected on prior study. Lateral costophrenic angles are obscured suggesting small layering effusions. Stable cardiomegaly. No pneumothorax Spondylitic changes in the lower lumbar spine. IMPRESSION: 1. Some interval improvement in apparent interstitial edema since prior study 2.  Support hardware in expected location as above Electronically Signed   By: Lucrezia Europe M.D.   On: 02/10/2018 07:51     Medications:     Scheduled Medications: . aspirin  81 mg Per Tube Daily  . atorvastatin  80 mg Oral q1800  . budesonide (PULMICORT) nebulizer solution  0.25 mg Nebulization BID  . chlorhexidine gluconate (MEDLINE KIT)  15 mL Mouth Rinse BID  . docusate  100 mg Oral BID  . famotidine  20 mg Per Tube Daily  . furosemide  80 mg Intravenous TID  . mouth rinse  15 mL Mouth Rinse 10 times per day  . nicotine  21 mg Transdermal Daily  . sennosides  5 mL Oral BID  . sodium chloride flush  10-40 mL Intracatheter Q12H  . sodium chloride flush  3 mL Intravenous Q12H  . traZODone  100 mg Oral QHS  . vancomycin variable dose per unstable renal function (pharmacist dosing)   Does not apply See admin instructions    Infusions: . sodium chloride 10 mL/hr at 02/10/18 1030  . ceFEPime (MAXIPIME) IV Stopped (02/09/18 1540)  . feeding supplement (VITAL AF 1.2 CAL) 55 mL/hr at 02/10/18 0700  . fentaNYL infusion INTRAVENOUS 200 mcg/hr (02/10/18 1030)  . ferumoxytol 510 mg (02/06/18 1622)  . heparin 1,300 Units/hr (02/10/18 1057)  . midazolam (VERSED) infusion 1.5 mg/hr (02/10/18 1030)  . norepinephrine (LEVOPHED) Adult infusion 25 mcg/min (02/10/18 1030)    PRN Medications: sodium chloride, acetaminophen, bisacodyl, fentaNYL, ipratropium-albuterol, midazolam, nitroGLYCERIN, ondansetron, ondansetron (ZOFRAN) IV, polyvinyl alcohol, sodium chloride flush,  sodium chloride flush    Patient Profile   George Washington is a 77 year old with a history of recently diagnosed systolic heart failure earlier this month, HTN, iron deficiency,CKD III and tobacco abuse.    Admitted [facility] for [facility] surgery and structural heart consultation this is complicated by marked volume overload and worsening renal function.   Assessment/Plan   1. PEA arrest x 2 on 11/28 - s/p CPR and EPI with prompt ROSC. IABP and swan placed - Remains intubated but neurologically intact   2. Acute Systolic Heart Failure -> cardiogenic shock , ICM/NICM with valvular disease .  - ECHO  01/2013 EF ~30%. Severe AS. - Now with IABP in place. Co-ox marginal on NE 25. Will continue  - Volume status improved. Will hold lasix today. Be careful not to overdiurese with severe AS and CKD  - No BB with shock - No Arb, dig, spiro with elevated creatinine and shock .  - Continue heparin for IABP. Hgb 10.2-> 8.6 -> 91 Will follow closely  3. Acute on chronic hypoxic respiratory failure s/p cardiac arrest - now intubated. CCM managing vent. On minimal support but agree with further diuresis before extubation - treating for possible PNA.  - severe COPD at baseline with PFTs 02/06/18 FEV1 0.94L (32%), FVC 1.48 (36%) DLCO 38%  4. Probable HCAP in setting of cardiac arrest.  - Continue vanc/cefipime - Await sputum.blood cultures - Check PCT  5. CAD - LHC 01/31/2018 with Ost RCA to Prox RCA lesion 100% stenosed, Prox CX lesion 70%, and mid Cx lesion 30% stenosed. Not CABG canddiate -. Continue aspirin and statin.   6. Severe Aortic Stenosis  - Structural heart team with Dr Angelena Form and Dr Roxy Manns following for possible TAVR.  - I d/w Dr. Roxy Manns. Will continue to pursue TAVR if/when creatinine improves. Will need pre-op CTs when renal function permits  7. AKI with CKD Stage III-IV - likely ATN -Creatinine 1.9>2.1-> 2.3 -> 2.9 -> 2.6. Follow closely post-arrest. Continue hemodynamic support.   7. George    - Moderate by echo. Hopefully will improve with diuresis and treatment of HF.   8. Anemia  -Iron sats 9% on 01/23/2018 .  - Received feraheme 11/26 - Hgb 9.1  9. Hyponatremia.   Sodium 126-> 129    10. PVCs - keep K > 4.0 Mg > 2.0 - supp K  CRITICAL CARE Performed by: Glori Bickers  Total critical care time: 45 minutes  Critical care time was exclusive of separately billable procedures and treating other patients.  Critical care was necessary to treat or prevent imminent or life-threatening deterioration.  Critical care was time spent personally by me (independent of midlevel providers or residents) on the following activities: development of treatment plan with patient and/or surrogate as well as nursing, discussions with consultants, evaluation of patient's response to treatment, examination of patient, obtaining history from patient or surrogate, ordering and performing treatments and interventions, ordering and review of laboratory studies, ordering and review of radiographic studies, pulse oximetry and Rama-evaluation of patient's condition.    Length of Stay: [place], George Washington  02/10/2018, 11:07 AM  Advanced Heart Failure Team Pager [phone] (M-F; 7a - 4p)  Please contact [facility] for night-coverage after hours (4p -7a ) and weekends on [email or web address]

## 2018-02-10 NOTE — Progress Notes (Signed)
[place] for Heparin Indication: IABP balloon pump  Allergies  Allergen Reactions  . Lisinopril     cough    Patient Measurements: Height: 5\' 6"  (167.6 cm) Weight: 156 lb 8.4 oz (71 kg) IBW/kg (Calculated) : 63.8 HEPARIN DW (KG): 74.7  Vital Signs: Temp: 100.6 F (38.1 C) (11/30 1900) BP: 96/55 (11/30 1900) Pulse Rate: 94 (11/30 1900)  Labs: Recent Labs    01/15/2018 0511 02/07/2018 1129  01/13/2018 1810 02/09/18 0425 02/09/18 0548 02/09/18 1315 02/09/18 2251 02/10/18 0256 02/10/18 0952 02/10/18 1827  HGB 10.4*  --   --   --  8.6* 8.6* 8.8*  --  9.1*  --   --   HCT 34.3*  --   --   --  26.4* 28.1* 27.7*  --  29.8*  --   --   PLT 192  --   --   --  192 209 181  --  191  --   --   LABPROT 14.4  --   --   --   --   --   --   --   --   --   --   INR 1.13  --   --   --   --   --   --   --   --   --   --   HEPARINUNFRC  --   --    < >  --  0.10*  --  0.15* 0.18*  --  0.15* 0.27*  CREATININE 2.33*  --   --   --  2.92*  --   --   --  2.60*  --   --   TROPONINI 0.61* 2.47*  --  8.27*  --   --   --   --   --   --   --    < > = values in this interval not displayed.    Estimated Creatinine Clearance: 21.5 mL/min (A) (by C-G formula based on SCr of 2.6 mg/dL (H)).  Assessment: Pt is a 5 yoM s/p PEA arrest with subsequent balloon pump. Pharmacy has been consulted for heparin management.  Heparin level is now therapeutic at 0.27. No signs/symptoms of bleeding or issues with infusion reported by nursing.   Goal of Therapy:  Heparin level 0.2-0.5 units/ml Monitor platelets by anticoagulation protocol: Yes   Plan:  Continue heparin infusion 1300 units/hr F/u AM heparin level  Thanks for allowing pharmacy to be a part of this patient's care.  Salome Arnt, PharmD, BCPS Please see AMION for all pharmacy numbers 02/10/2018 7:29 PM

## 2018-02-10 NOTE — Progress Notes (Signed)
[place] for Heparin Indication: IABP balloon pump  Allergies  Allergen Reactions  . Lisinopril     cough    Patient Measurements: Height: 5\' 6"  (167.6 cm) Weight: 164 lb 10.9 oz (74.7 kg) IBW/kg (Calculated) : 63.8 HEPARIN DW (KG): 74.7  Vital Signs: Temp: 100.6 F (38.1 C) (11/30 0000) Temp Source: Core (11/30 0000) BP: 111/63 (11/30 0000) Pulse Rate: 85 (11/30 0000)  Labs: Recent Labs    02/07/18 0424 01/25/2018 0511 01/28/2018 1129  01/24/2018 1810 02/09/18 0425 02/09/18 0548 02/09/18 1315 02/09/18 2251  HGB 10.2* 10.4*  --   --   --  8.6* 8.6* 8.8*  --   HCT 32.6* 34.3*  --   --   --  26.4* 28.1* 27.7*  --   PLT 200 192  --   --   --  192 209 181  --   LABPROT  --  14.4  --   --   --   --   --   --   --   INR  --  1.13  --   --   --   --   --   --   --   HEPARINUNFRC  --   --   --    < >  --  0.10*  --  0.15* 0.18*  CREATININE 2.11* 2.33*  --   --   --  2.92*  --   --   --   TROPONINI  --  0.61* 2.47*  --  8.27*  --   --   --   --    < > = values in this interval not displayed.    Estimated Creatinine Clearance: 19.1 mL/min (A) (by C-G formula based on SCr of 2.92 mg/dL (H)).  Assessment: Pt is a 67 yoM s/p PEA arrest this morning who went for balloon pump. Pharmacy has been consulted for heparin management.  Heparin level slightly low at 0.18 units/ml ( for lower goal with IABP.)   Goal of Therapy:  Heparin level 0.2-0.5 units/ml Monitor platelets by anticoagulation protocol: Yes   Plan:  Increase heparin infusion slightly to 1150 units/hr 8h heparin level Monitor daily heparin level and CBC, s/sx bleeding  Thanks for allowing pharmacy to be a part of this patient's care.  Excell Seltzer, PharmDClinical Pharmacist 02/10/2018 12:19 AM

## 2018-02-10 NOTE — Progress Notes (Signed)
Paged Dr. Neena Rhymes regarding pt's K of 3.4. Goal is to keep K ~4.0. 40 of K ordered and administered. Will continue to monitor.

## 2018-02-11 ENCOUNTER — Inpatient Hospital Stay (HOSPITAL_COMMUNITY): Payer: Medicare Other

## 2018-02-11 LAB — CBC WITH DIFFERENTIAL/PLATELET
BASOS PCT: 0 %
Basophils Absolute: 0 10*3/uL (ref 0.0–0.1)
Eosinophils Absolute: 0 10*3/uL (ref 0.0–0.5)
Eosinophils Relative: 0 %
HCT: 29.1 % — ABNORMAL LOW (ref 39.0–52.0)
HEMOGLOBIN: 9.1 g/dL — AB (ref 13.0–17.0)
Lymphocytes Relative: 8 %
Lymphs Abs: 0.3 10*3/uL — ABNORMAL LOW (ref 0.7–4.0)
MCH: 26.1 pg (ref 26.0–34.0)
MCHC: 31.3 g/dL (ref 30.0–36.0)
MCV: 83.6 fL (ref 80.0–100.0)
Monocytes Absolute: 0.4 10*3/uL (ref 0.1–1.0)
Monocytes Relative: 12 %
NRBC: 0 % (ref 0.0–0.2)
Neutro Abs: 2.8 10*3/uL (ref 1.7–7.7)
Neutrophils Relative %: 79 %
Platelets: 161 10*3/uL (ref 150–400)
RBC: 3.48 MIL/uL — AB (ref 4.22–5.81)
RDW: 17.4 % — ABNORMAL HIGH (ref 11.5–15.5)
WBC: 3.6 10*3/uL — ABNORMAL LOW (ref 4.0–10.5)

## 2018-02-11 LAB — COOXEMETRY PANEL
CARBOXYHEMOGLOBIN: 2.2 % — AB (ref 0.5–1.5)
Methemoglobin: 1.3 % (ref 0.0–1.5)
O2 Saturation: 70.5 %
Total hemoglobin: 8.4 g/dL — ABNORMAL LOW (ref 12.0–16.0)

## 2018-02-11 LAB — C DIFFICILE QUICK SCREEN W PCR REFLEX
C DIFFICILE (CDIFF) INTERP: NOT DETECTED
C Diff antigen: NEGATIVE
C Diff toxin: NEGATIVE

## 2018-02-11 LAB — MAGNESIUM: Magnesium: 2.1 mg/dL (ref 1.7–2.4)

## 2018-02-11 LAB — HEPARIN LEVEL (UNFRACTIONATED)
Heparin Unfractionated: 0.5 IU/mL (ref 0.30–0.70)
Heparin Unfractionated: 0.15 IU/mL — ABNORMAL LOW (ref 0.30–0.70)

## 2018-02-11 LAB — BASIC METABOLIC PANEL
Anion gap: 16 — ABNORMAL HIGH (ref 5–15)
BUN: 44 mg/dL — ABNORMAL HIGH (ref 8–23)
CO2: 25 mmol/L (ref 22–32)
Calcium: 7.9 mg/dL — ABNORMAL LOW (ref 8.9–10.3)
Chloride: 96 mmol/L — ABNORMAL LOW (ref 98–111)
Creatinine, Ser: 1.97 mg/dL — ABNORMAL HIGH (ref 0.61–1.24)
GFR calc Af Amer: 37 mL/min — ABNORMAL LOW (ref >60)
GFR, EST NON AFRICAN AMERICAN: 32 mL/min — AB (ref >60)
Glucose, Bld: 86 mg/dL (ref 70–99)
POTASSIUM: 3.2 mmol/L — AB (ref 3.5–5.1)
Sodium: 137 mmol/L (ref 135–145)

## 2018-02-11 LAB — VANCOMYCIN, RANDOM: Vancomycin Rm: 9

## 2018-02-11 LAB — PROCALCITONIN: PROCALCITONIN: 2.83 ng/mL

## 2018-02-11 MED ORDER — POTASSIUM CHLORIDE 20 MEQ/15ML (10%) PO SOLN
40.0000 meq | Freq: Once | ORAL | Status: AC
  Administered 2018-02-11: 40 meq via ORAL
  Filled 2018-02-11: qty 30

## 2018-02-11 MED ORDER — VANCOMYCIN HCL IN DEXTROSE 1-5 GM/200ML-% IV SOLN
1000.0000 mg | INTRAVENOUS | Status: DC
  Administered 2018-02-11 – 2018-02-13 (×3): 1000 mg via INTRAVENOUS
  Filled 2018-02-11 (×3): qty 200

## 2018-02-11 MED ORDER — MIDAZOLAM HCL 2 MG/2ML IJ SOLN: 1.0000 mg | INTRAMUSCULAR | Status: DC | PRN

## 2018-02-11 MED ORDER — CHLORHEXIDINE GLUCONATE CLOTH 2 % EX PADS
6.0000 | MEDICATED_PAD | Freq: Every day | CUTANEOUS | Status: DC
  Administered 2018-02-11 – 2018-02-12 (×2): 6 via TOPICAL

## 2018-02-11 NOTE — Progress Notes (Signed)
NAME:  George Washington., MRN:  [id number], DOB:  02/20/1941, LOS: 6 ADMISSION DATE:  01/24/2018, CONSULTATION DATE:  02/06/2018 REFERRING MD:  Ellyn Hack - Cardiology, CHIEF COMPLAINT:  Respiratory failure.   HPI/course in hospital  77 year old mand with PEA cardiac arrest 11/28  intubated and taken to cath lab for IABP. Neurologically intact, brief arrest  Patient has known severe AS with LV systolic dysfunction.   Hospital events  38/59: 77 year old mand with PEA cardiac arrest 11/28  intubated and taken to cath lab for IABP. Neurologically intact, brief arrest 11/29: weaning pressors. Still on IABP. Getting diuresis  11/30: Renal function slightly improved. Minimally net negative. Still requiring pressors.  Interim history/subjective:  No events overnight, remains on pressors  Objective   Blood pressure (!) 121/51, pulse 91, temperature 98.8 F (37.1 C), resp. rate 15, height 5\' 6"  (1.676 m), weight 68.3 kg, SpO2 96 %. PAP: (25-69)/(9-32) 37/16 CVP:  [1 mmHg-15 mmHg] 8 mmHg PCWP:  [20 mmHg] 20 mmHg CO:  [5.9 L/min] 5.9 L/min CI:  [3.3 L/min/m2] 3.3 L/min/m2  Vent Mode: PRVC FiO2 (%):  [30 %] 30 % Set Rate:  [15 bmp] 15 bmp Vt Set:  [510 mL] 510 mL PEEP:  [5 cmH20] 5 cmH20 Plateau Pressure:  [15 cmH20-19 cmH20] 15 cmH20   Intake/Output Summary (Last 24 hours) at 02/11/2018 0912 Last data filed at 02/11/2018 0800 Gross per 24 hour  Intake 3395.34 ml  Output 3395 ml  Net 0.34 ml   Filed Weights   02/09/18 0500 02/10/18 0500 02/11/18 0400  Weight: 74.7 kg 71 kg 68.3 kg    PAP: (25-69)/(9-32) 37/16 CVP:  [1 mmHg-15 mmHg] 8 mmHg PCWP:  [20 mmHg] 20 mmHg CO:  [5.9 L/min] 5.9 L/min CI:  [3.3 L/min/m2] 3.3 L/min/m2   Physical exam   General: Acutely ill appearing male, resting comfortably in exam bed on versed/fentanyl HEENT: /AT, PERRL, EOM-I and MMM, ETT in place Pulmonary: Coarse BS diffusely Cardiac: IABP heard, RRR, coarse systolic murmur audible over the  LUSB Abdomen: Soft, NT, ND and +BS Extremities: -edema and -tenderness Neuro: Sedate but easily arousable and following commands Skin: Intact other then puncture wounds  Assessment & Plan:   S/p PEA arrest; Now in cardiogenic shock from severe AS.  ECHO 30% w/ severe AS H/o CAD Improved hemodynamics with IABP Plan Cont on-going hemodynamic support as directed by advanced heart failure team Team considering TAVR depending on how he does over weekend (assuming creatinine improves) Hold further lasix given hemodynamics ASA and statin as ordered  Acute Hypoxic respiratory failure  H/o COPD FEV1 32% at baseline Portable chest x-ray personally reviewed: ETT stable. Edema slightly improved. Plan Begin PS trials but no extubation until hemodynamics are improved ?TAVR on Monday so will hold off extubation for now BD as ordered PAD protocol with RASS of 0 to -1 CXR and ABG in AM VAP bundle  AKI with CKD stage IV: baseline cr 1.9 -Cr trending down, 2.6 today. UOP good. Plan D/C lasix, I believe patient is optimized from fluid standpoint Strict I/O Replace electrolytes as indicated BMET in AM  Fluid and electrolyte imbalance: hypervolemic hyponatremia->improving with diuresis,  Plan Daily BMP Goal K > 4, Mag > 2 D/C lasix  Anemia of chronic illness No evidence of bleeding, Hgb improved today S/p Feraheme 11/29 Plan Daily CBC, transfuse for Hgb < 7  Neuro: patient is intact when off sedation Fentanyl drip Versed drip, will d/c and start PRN pushes given  renal function  ID:  MODERATE PSEUDOMONAS AERUGINOSA in sputum MODERATE STAPHYLOCOCCUS AUREUS in sputum Contine vanc/cefepime  PCCM will continue to follow  Best practice:  Diet: Maintain NPO as flat Pain/Anxiety/Delirium protocol (if indicated): versed and fentanyl VAP protocol (if indicated): in place DVT prophylaxis: heparin for IABP GI prophylaxis: famotidine Glucose control: euglycemic Mobility: bedrest Code  Status: DNR if Tomassetti-arrests Family Communication: no family present Disposition: ICU  The patient is critically ill with multiple organ systems failure and requires high complexity decision making for assessment and support, frequent evaluation and titration of therapies, application of advanced monitoring technologies and extensive interpretation of multiple databases.   Critical Care Time devoted to patient care services described in this note is  34  Minutes. This time reflects time of care of this signee Dr Jennet Maduro. This critical care time does not reflect procedure time, or teaching time or supervisory time of PA/NP/Med student/Med Resident etc but could involve care discussion time.  Rush Farmer, M.D. [facility] Pulmonary/Critical Care Medicine. Pager: [phone]. After hours pager: [phone].

## 2018-02-11 NOTE — Progress Notes (Signed)
Pt noted to be more anxious, increased RR, tachy to 120's. Has been tolerating and doing well with ps 8/3 @ 30% x7 hours. Switched back to prvc r15, p5 @ 30%. Sp02 >97%, patient resting comfortable, HR decreased. Emotional support given.

## 2018-02-11 NOTE — Progress Notes (Signed)
ANTICOAGULATION CONSULT NOTE  Pharmacy Consult for Heparin Indication: IABP balloon pump  Patient Measurements: Height: 5\' 6"  (167.6 cm) Weight: 150 lb 9.2 oz (68.3 kg) IBW/kg (Calculated) : 63.8 HEPARIN DW (KG): 74.7  Vital Signs: Temp: 99.5 F (37.5 C) (12/01 1600) Temp Source: Rectal (12/01 1400) BP: 115/85 (12/01 1600) Pulse Rate: 98 (12/01 1600)  Labs: Recent Labs    02/04/2018 1810 02/09/18 0425  02/09/18 1315  02/10/18 0256  02/10/18 1827 02/11/18 0345 02/11/18 0520 02/11/18 1511  HGB  --  8.6*   < > 8.8*  --  9.1*  --   --  9.1*  --   --   HCT  --  26.4*   < > 27.7*  --  29.8*  --   --  29.1*  --   --   PLT  --  192   < > 181  --  191  --   --  161  --   --   HEPARINUNFRC  --  0.10*  --  0.15*   < >  --    < > 0.27*  --  0.50 0.15*  CREATININE  --  2.92*  --   --   --  2.60*  --   --  1.97*  --   --   TROPONINI 8.27*  --   --   --   --   --   --   --   --   --   --    < > = values in this interval not displayed.    Estimated Creatinine Clearance: 28.3 mL/min (A) (by C-G formula based on SCr of 1.97 mg/dL (H)).  Assessment: Pt is a 18 yoM s/p PEA arrest this morning who went for balloon pump. Pharmacy has been consulted for heparin management.  Heparin level is now subtherapeutic at 0.15. No new bleeding noted.    Goal of Therapy:  Heparin level 0.2-0.5 units/ml Monitor platelets by anticoagulation protocol: Yes   Plan:  Increase heparin infusion to 1400 units/hr Check an 8 hr heparin level Daily heparin level and CBC  Salome Arnt, PharmD, BCPS Please see AMION for all pharmacy numbers 02/11/2018 4:21 PM

## 2018-02-11 NOTE — Progress Notes (Signed)
[place] for Heparin Indication: IABP balloon pump  Allergies  Allergen Reactions  . Lisinopril     cough    Patient Measurements: Height: 5\' 6"  (167.6 cm) Weight: 150 lb 9.2 oz (68.3 kg) IBW/kg (Calculated) : 63.8 HEPARIN DW (KG): 74.7  Vital Signs: Temp: 98.6 F (37 C) (12/01 0630) BP: 105/71 (12/01 0600) Pulse Rate: 93 (12/01 0630)  Labs: Recent Labs    01/14/2018 1129  01/20/2018 1810 02/09/18 0425  02/09/18 1315  02/10/18 0256 02/10/18 0952 02/10/18 1827 02/11/18 0345 02/11/18 0520  HGB  --   --   --  8.6*   < > 8.8*  --  9.1*  --   --  9.1*  --   HCT  --   --   --  26.4*   < > 27.7*  --  29.8*  --   --  29.1*  --   PLT  --   --   --  192   < > 181  --  191  --   --  161  --   HEPARINUNFRC  --    < >  --  0.10*  --  0.15*   < >  --  0.15* 0.27*  --  0.50  CREATININE  --   --   --  2.92*  --   --   --  2.60*  --   --   --   --   TROPONINI 2.47*  --  8.27*  --   --   --   --   --   --   --   --   --    < > = values in this interval not displayed.    Estimated Creatinine Clearance: 21.5 mL/min (A) (by C-G formula based on SCr of 2.6 mg/dL (H)).  Assessment: Pt is a 24 yoM s/p PEA arrest this morning who went for balloon pump. Pharmacy has been consulted for heparin management.  Heparin level therapeutic at 0.5 units/ml (for lower goal with IABP) on heparin 1300 units/hr. Hb 9.1, stable. Small amounts of bleeding noted on suction from mouth. No other signs/symptoms of bleeding or issues with infusion reported by nursing.   Goal of Therapy:  Heparin level 0.2-0.5 units/ml Monitor platelets by anticoagulation protocol: Yes   Plan:  Continue heparin infusion to 1300 units/hr Given significant jump, will check another confirmatory heparin level Monitor daily heparin level and CBC, s/sx bleeding  Thanks for allowing pharmacy to be a part of this patient's care.  Claiborne Billings, PharmD PGY2 Cardiology Pharmacy  Resident Phone [phone] Please check AMION for all Pharmacist numbers by unit 02/11/2018 7:42 AM

## 2018-02-11 NOTE — Progress Notes (Signed)
Advanced Heart Failure Rounding Note  PCP-Cardiologist: Kathlyn Sacramento, MD   Subjective:    Had PEA arrest on 11/28. Code blue x 2. Had CPR and epi with prompt ROSC. Co-ox pre-code 47% Intubated and taken to cath lab. IABP and swan placed.   On 11/29 developed fevers and thick tan secretions concerning for PNA. Cultures drawn. Started on vanc and cefipime. PCT 0.23-> 2.83. BCx NGTD. Sputum moderate pseudomonas and staph  Remains intubated. Awake on vent and follows commands. Weaning vent. Now at FiO2 30%  Remains on NE 24 -> 22. Co-ox 54% -> 70%  Creatinine 2.3-> 2.9 -> 2.6 -> 1.97   Swan numbers done personally  CVP 7-8 PA 36/17 PCWP 13 Thermo 5.9/3.2 Co-ox 70.5%   Objective:   Weight Range: 68.3 kg Body mass index is 24.3 kg/m.   Vital Signs:   Temp:  [97.5 F (36.4 C)-101.8 F (38.8 C)] 98.8 F (37.1 C) (12/01 0800) Pulse Rate:  [79-143] 91 (12/01 0812) Resp:  [10-24] 15 (12/01 0812) BP: (96-132)/(43-98) 121/51 (12/01 0812) SpO2:  [92 %-100 %] 96 % (12/01 0812) Arterial Line BP: (85-139)/(38-65) 116/49 (12/01 0800) FiO2 (%):  [30 %] 30 % (12/01 0812) Weight:  [68.3 kg] 68.3 kg (12/01 0400) Last BM Date: 02/10/18  Weight change: Filed Weights   02/09/18 0500 02/10/18 0500 02/11/18 0400  Weight: 74.7 kg 71 kg 68.3 kg    Intake/Output:   Intake/Output Summary (Last 24 hours) at 02/11/2018 0912 Last data filed at 02/11/2018 0800 Gross per 24 hour  Intake 3395.34 ml  Output 3395 ml  Net 0.34 ml      Physical Exam    General:  Intubated. Awake on vent HEENT: normal + ETT Neck: supple. no JVD. Carotids 2+ bilat; no bruits. No lymphadenopathy or thryomegaly appreciated. Cor: PMI nondisplaced. Regular rate & rhythm. Soft AS Lungs: coarse Abdomen: soft, nontender, nondistended. No hepatosplenomegaly. No bruits or masses. Good bowel sounds. Extremities: no cyanosis, clubbing, rash, edema  L groin IABP and swan Neuro: awake on vent. Follows  commands Rectal tube in   Telemetry   Sinus 80-90s Occasional  PVCs Personally reviewed  EKG    N/a   Labs    CBC Recent Labs    02/10/18 0256 02/11/18 0345  WBC 7.4 3.6*  NEUTROABS 5.8 2.8  HGB 9.1* 9.1*  HCT 29.8* 29.1*  MCV 82.1 83.6  PLT 191 381   Basic Metabolic Panel Recent Labs    02/09/18 0938 02/10/18 0256 02/11/18 0345  NA  --  131* 137  K  --  3.4* 3.2*  CL  --  89* 96*  CO2  --  31 25  GLUCOSE  --  136* 86  BUN  --  43* 44*  CREATININE  --  2.60* 1.97*  CALCIUM  --  8.4* 7.9*  MG 1.7  --  2.1   Liver Function Tests No results for input(s): AST, ALT, ALKPHOS, BILITOT, PROT, ALBUMIN in the last 72 hours. No results for input(s): LIPASE, AMYLASE in the last 72 hours. Cardiac Enzymes Recent Labs    01/15/2018 1129 01/27/2018 1810  TROPONINI 2.47* 8.27*    BNP: BNP (last 3 results) Recent Labs    02/06/18 0126 01/14/2018 0511  BNP >4,500.0* 2,385.1*    ProBNP (last 3 results) No results for input(s): PROBNP in the last 8760 hours.   D-Dimer No results for input(s): DDIMER in the last 72 hours. Hemoglobin A1C No results for input(s): HGBA1C in the last  72 hours. Fasting Lipid Panel No results for input(s): CHOL, HDL, LDLCALC, TRIG, CHOLHDL, LDLDIRECT in the last 72 hours. Thyroid Function Tests No results for input(s): TSH, T4TOTAL, T3FREE, THYROIDAB in the last 72 hours.  Invalid input(s): FREET3  Other results:   Imaging    No results found.   Medications:     Scheduled Medications: . aspirin  81 mg Per Tube Daily  . atorvastatin  80 mg Oral q1800  . budesonide (PULMICORT) nebulizer solution  0.25 mg Nebulization BID  . chlorhexidine gluconate (MEDLINE KIT)  15 mL Mouth Rinse BID  . Chlorhexidine Gluconate Cloth  6 each Topical Daily  . docusate  100 mg Oral BID  . famotidine  20 mg Per Tube Daily  . furosemide  80 mg Intravenous TID  . mouth rinse  15 mL Mouth Rinse 10 times per day  . nicotine  21 mg Transdermal  Daily  . sennosides  5 mL Oral BID  . sodium chloride flush  10-40 mL Intracatheter Q12H  . traZODone  100 mg Per Tube QHS    Infusions: . sodium chloride 10 mL/hr at 02/11/18 0800  . sodium chloride 10 mL/hr at 02/11/18 0800  . sodium chloride 10 mL/hr at 02/11/18 0800  . ceFEPime (MAXIPIME) IV Stopped (02/10/18 1453)  . feeding supplement (VITAL AF 1.2 CAL) 55 mL/hr at 02/11/18 0800  . fentaNYL infusion INTRAVENOUS Stopped (02/11/18 9622)  . ferumoxytol 468 mL/hr at 02/11/18 0800  . heparin 1,300 Units/hr (02/11/18 0800)  . midazolam (VERSED) infusion Stopped (02/11/18 2979)  . norepinephrine (LEVOPHED) Adult infusion 22 mcg/min (02/11/18 0800)  . vancomycin 1,000 mg (02/11/18 0910)    PRN Medications: sodium chloride, sodium chloride, sodium chloride, acetaminophen, bisacodyl, fentaNYL, ipratropium-albuterol, midazolam, nitroGLYCERIN, ondansetron, ondansetron (ZOFRAN) IV, polyvinyl alcohol, sodium chloride flush    Patient Profile   George Washington is a 77 year old with a history of recently diagnosed systolic heart failure earlier this month, HTN, iron deficiency,CKD III and tobacco abuse.    Admitted from [facility] for CT surgery and structural heart consultation this is complicated by marked volume overload and worsening renal function.   Assessment/Plan   1. PEA arrest x 2 on 11/28 - s/p CPR and EPI with prompt ROSC. IABP and swan placed - Remains intubated but neurologically intact   2. Acute Systolic Heart Failure -> cardiogenic shock , ICM/NICM with valvular disease .  - ECHO 01/2013 EF ~30%. Severe AS. - Now with IABP in place. Co-ox improved on NE 22 and IABP. Will wean slowly as toelratedd - Volume status much improved. PCWP 13 today. Stop lasix.  Be careful not to overdiurese with severe AS and CKD  - No BB with shock - No Arb, dig, spiro with elevated creatinine and shock .  - Continue heparin for IABP. Hgb stable at 9.1 Will follow closely  3. Acute on chronic hypoxic  respiratory failure s/p cardiac arrest - now intubated. CCM managing vent. (D/w Dr Nelda Marseille) Rich Brave as tolerated. Good from volume status. May need more time with HCAP.  - severe COPD at baseline with PFTs 02/06/18 FEV1 0.94L (32%), FVC 1.48 (36%) DLCO 38%  4. Probable HCAP in setting of cardiac arrest. - PCT up to 2.83 . - Initial sputum cx = moderate pseudomonas and moderate staph aureus - Continue vanc/cefipime. Await sensitivities. Should have good coverage for now.   5. CAD - LHC 01/31/2018 with Ost RCA to Prox RCA lesion 100% stenosed, Prox CX lesion 70%, and mid Cx  lesion 30% stenosed. Not CABG canddiate -. Continue aspirin and statin.   6. Severe Aortic Stenosis  - Structural heart team with Dr Angelena Form and Dr Roxy Manns following for possible TAVR.  - I d/w Dr. Roxy Manns. Will continue to pursue TAVR if/when creatinine improves. Will need pre-op CTs when renal function permits - Creatinine now under 2.0 but has HCAP. Will need further stabilization prior to TAVR consideration  7. AKI with CKD Stage III-IV - likely ATN -Creatinine 1.9>2.1-> 2.3 -> 2.9 -> 2.6 -> 1.97. Improving with hemodynamic support  7. George  - Moderate by echo. Hopefully will improve with diuresis and treatment of HF.   8. Anemia  -Iron sats 9% on 01/23/2018 .  - Received feraheme 11/26 - Hgb stable at 9.1  9. Hyponatremia.   Sodium 126-> 129  -> 137  10. PVCs - improved - keep K > 4.0 Mg > 2.0 - supp K  11. Diarrhea - likely from TFs - check c. diff  CRITICAL CARE Performed by: Glori Bickers  Total critical care time: 35 minutes  Critical care time was exclusive of separately billable procedures and treating other patients.  Critical care was necessary to treat or prevent imminent or life-threatening deterioration.  Critical care was time spent personally by me (independent of midlevel providers or residents) on the following activities: development of treatment plan with patient and/or surrogate as  well as nursing, discussions with consultants, evaluation of patient's response to treatment, examination of patient, obtaining history from patient or surrogate, ordering and performing treatments and interventions, ordering and review of laboratory studies, ordering and review of radiographic studies, pulse oximetry and Russman-evaluation of patient's condition.     Length of Stay: [place], MD  02/11/2018, 9:12 AM  Advanced Heart Failure Team Pager [phone] (M-F; 7a - 4p)  Please contact [facility] for night-coverage after hours (4p -7a ) and weekends on [email or web address]

## 2018-02-11 NOTE — Progress Notes (Signed)
Pharmacy Antibiotic Note  George Washington. is a 77 y.o. male admitted on 01/12/2018 with concerns for possible PNA.  Pharmacy has been consulted for vancomycin/cefepime dosing. Tmax 101.8, WBC down to 3.6, PCT 0.23 > 2.83. Sputum cultures growing moderate Pseudomonas and S aureus. Renal function now improving, Scr 1.9>>2.33>2.92>1.97.  Vancomycin random level obtained to ensure adequate clearance of vancomycin 1500 mg x1 given AKI. Returned at 9 mcg/mL, okay to redose.  Plan:  - Start vancomycin 1000 mg IV q24h. Goal trough 15-20 mcg/mL. - Cefepime 1g IV q24h  Height: 5\' 6"  (167.6 cm) Weight: 150 lb 9.2 oz (68.3 kg) IBW/kg (Calculated) : 63.8  Temp (24hrs), Avg:99.9 F (37.7 C), Min:97.5 F (36.4 C), Max:101.8 F (38.8 C)  Recent Labs  Lab 02/07/18 0424 02/03/2018 0511 01/23/2018 0934 02/09/18 0425 02/09/18 0548 02/09/18 1315 02/10/18 0256 02/11/18 0345 02/11/18 0520  WBC 5.3 7.4  --  8.3 8.4 9.6 7.4 3.6*  --   CREATININE 2.11* 2.33*  --  2.92*  --   --  2.60* 1.97*  --   LATICACIDVEN  --  5.6* 1.1  --   --   --   --   --   --   VANCORANDOM  --   --   --   --   --   --   --   --  9    Estimated Creatinine Clearance: 28.3 mL/min (A) (by C-G formula based on SCr of 1.97 mg/dL (H)).    Allergies  Allergen Reactions  . Lisinopril     cough    Antimicrobials this admission: Cefepime 11/29 >> Vancomycin 11/29 >>   Dose adjustments this admission: VR 12/1 9 s/p 1500 mg load   Microbiology results: 11/29 BCx: NG < 24hr 11/29 Sputum: mod Pseudomonas and S aureus  11/28 MRSA PCR: negative  Thank you for allowing pharmacy to be a part of this patient's care.  Claiborne Billings, PharmD PGY2 Cardiology Pharmacy Resident Phone [phone] Please check AMION for all Pharmacist numbers by unit 02/11/2018 8:21 AM

## 2018-02-11 NOTE — Progress Notes (Addendum)
Neuro: George Washington, follows commands, nods/gestures appropriately, moves all extremities. Resp: Tolerated x 7 hours of ps 8/5 30%, tiring, switched back to prvc, p5, 30%. Sp02 >93%. Minimal tan secretion suctioned. CV: NSR, pvc's/runs of afib x2, asymptomatic. MAP >65. Heparin gtts adjusted per order. CI 36./4.3 CO 6/7.  GIGU: TF @ goal. +active bowel sounds, rectal tube in place-1300 out this shift. CDiff negative. Foley UOP 1100 this shift. K replaced. Labs for morning Aspiration precautions. Family @ bedside/updated this afternoon.

## 2018-02-11 NOTE — Plan of Care (Signed)
  Problem: Clinical Measurements: Goal: Ability to maintain clinical measurements within normal limits will improve Outcome: Progressing Goal: Will remain free from infection Outcome: Progressing Goal: Diagnostic test results will improve Outcome: Progressing Goal: Respiratory complications will improve Outcome: Progressing Goal: Cardiovascular complication will be avoided Outcome: Progressing   Problem: Activity: Goal: Risk for activity intolerance will decrease Outcome: Progressing   Problem: Nutrition: Goal: Adequate nutrition will be maintained Outcome: Progressing   Problem: Elimination: Goal: Will not experience complications related to bowel motility Outcome: Progressing Note:  Pt has started making stool  Goal: Will not experience complications related to urinary retention Outcome: Progressing   Problem: Pain Managment: Goal: General experience of comfort will improve Outcome: Progressing   Problem: Safety: Goal: Ability to remain free from injury will improve Outcome: Progressing   Problem: Skin Integrity: Goal: Risk for impaired skin integrity will decrease Outcome: Progressing   Problem: Activity: Goal: Ability to tolerate increased activity will improve Outcome: Progressing   Problem: Respiratory: Goal: Ability to maintain a clear airway and adequate ventilation will improve Outcome: Progressing Note:  Copious thick secretions   Problem: Role Relationship: Goal: Method of communication will improve Outcome: Progressing Note:  Pt very anxious, nods and engages in some communication but easily distracted/overwhelmed by anxiety   Problem: Activity: Goal: Capacity to carry out activities will improve Outcome: Progressing   Problem: Cardiac: Goal: Ability to achieve and maintain adequate cardiopulmonary perfusion will improve Outcome: Progressing

## 2018-02-11 NOTE — Progress Notes (Signed)
This RN did not wedge Swan balloon, used PAD to calculate hemodynamics

## 2018-02-11 NOTE — Progress Notes (Signed)
Noted increased ectopy, short run afib. Currently back to NSR. K result was 3.2 this morning, order for 40 meq already given. Order obtained for additional replacement given the patient had additional dose lasix  80mg  this morning.

## 2018-02-11 DEATH — deceased

## 2018-02-12 ENCOUNTER — Inpatient Hospital Stay (HOSPITAL_COMMUNITY): Payer: Medicare Other

## 2018-02-12 LAB — CBC WITH DIFFERENTIAL/PLATELET
Abs Immature Granulocytes: 0.32 10*3/uL — ABNORMAL HIGH (ref 0.00–0.07)
Basophils Absolute: 0 10*3/uL (ref 0.0–0.1)
Basophils Relative: 0 %
EOS PCT: 22 %
Eosinophils Absolute: 1.2 10*3/uL — ABNORMAL HIGH (ref 0.0–0.5)
HCT: 28.2 % — ABNORMAL LOW (ref 39.0–52.0)
Hemoglobin: 8.4 g/dL — ABNORMAL LOW (ref 13.0–17.0)
Immature Granulocytes: 6 %
Lymphocytes Relative: 7 %
Lymphs Abs: 0.4 10*3/uL — ABNORMAL LOW (ref 0.7–4.0)
MCH: 25.3 pg — ABNORMAL LOW (ref 26.0–34.0)
MCHC: 29.8 g/dL — ABNORMAL LOW (ref 30.0–36.0)
MCV: 84.9 fL (ref 80.0–100.0)
Monocytes Absolute: 0.6 10*3/uL (ref 0.1–1.0)
Monocytes Relative: 11 %
Neutro Abs: 2.8 10*3/uL (ref 1.7–7.7)
Neutrophils Relative %: 54 %
Platelets: 158 10*3/uL (ref 150–400)
RBC: 3.32 MIL/uL — ABNORMAL LOW (ref 4.22–5.81)
RDW: 18.2 % — ABNORMAL HIGH (ref 11.5–15.5)
WBC Morphology: INCREASED
WBC: 5.3 10*3/uL (ref 4.0–10.5)
nRBC: 0 % (ref 0.0–0.2)

## 2018-02-12 LAB — HEPARIN LEVEL (UNFRACTIONATED)
HEPARIN UNFRACTIONATED: 0.49 [IU]/mL (ref 0.30–0.70)
Heparin Unfractionated: 0.62 IU/mL (ref 0.30–0.70)
Heparin Unfractionated: 0.69 IU/mL (ref 0.30–0.70)

## 2018-02-12 LAB — CULTURE, RESPIRATORY W GRAM STAIN

## 2018-02-12 LAB — BASIC METABOLIC PANEL
Anion gap: 6 (ref 5–15)
BUN: 50 mg/dL — ABNORMAL HIGH (ref 8–23)
CALCIUM: 7.7 mg/dL — AB (ref 8.9–10.3)
CO2: 30 mmol/L (ref 22–32)
Chloride: 102 mmol/L (ref 98–111)
Creatinine, Ser: 2.22 mg/dL — ABNORMAL HIGH (ref 0.61–1.24)
GFR, EST AFRICAN AMERICAN: 32 mL/min — AB (ref >60)
GFR, EST NON AFRICAN AMERICAN: 28 mL/min — AB (ref >60)
Glucose, Bld: 146 mg/dL — ABNORMAL HIGH (ref 70–99)
Potassium: 4 mmol/L (ref 3.5–5.1)
Sodium: 138 mmol/L (ref 135–145)

## 2018-02-12 LAB — POCT I-STAT 3, ART BLOOD GAS (G3+)
Bicarbonate: 25.1 mmol/L (ref 20.0–28.0)
O2 Saturation: 96 %
Patient temperature: 37.3
TCO2: 26 mmol/L (ref 22–32)
pCO2 arterial: 41.2 mmHg (ref 32.0–48.0)
pH, Arterial: 7.393 (ref 7.350–7.450)
pO2, Arterial: 85 mmHg (ref 83.0–108.0)

## 2018-02-12 LAB — COOXEMETRY PANEL
Carboxyhemoglobin: 1.6 % — ABNORMAL HIGH (ref 0.5–1.5)
METHEMOGLOBIN: 1.9 % — AB (ref 0.0–1.5)
O2 Saturation: 73.1 %
Total hemoglobin: 8.3 g/dL — ABNORMAL LOW (ref 12.0–16.0)

## 2018-02-12 LAB — PHOSPHORUS: Phosphorus: 2.9 mg/dL (ref 2.5–4.6)

## 2018-02-12 LAB — CULTURE, RESPIRATORY

## 2018-02-12 LAB — PROCALCITONIN: Procalcitonin: 6.72 ng/mL

## 2018-02-12 LAB — MAGNESIUM: Magnesium: 2.1 mg/dL (ref 1.7–2.4)

## 2018-02-12 LAB — TROPONIN I: TROPONIN I: 0.57 ng/mL — AB (ref <0.03)

## 2018-02-12 MED ORDER — ASPIRIN 81 MG PO CHEW
81.0000 mg | CHEWABLE_TABLET | Freq: Every day | ORAL | Status: DC
  Administered 2018-02-13: 81 mg via ORAL
  Filled 2018-02-12: qty 1

## 2018-02-12 MED ORDER — ALPRAZOLAM 0.25 MG PO TABS
0.2500 mg | ORAL_TABLET | Freq: Two times a day (BID) | ORAL | Status: DC | PRN
  Administered 2018-02-12 – 2018-02-13 (×3): 0.25 mg via ORAL
  Filled 2018-02-12 (×3): qty 1

## 2018-02-12 MED ORDER — ORAL CARE MOUTH RINSE
15.0000 mL | Freq: Two times a day (BID) | OROMUCOSAL | Status: DC
  Administered 2018-02-12 – 2018-02-13 (×2): 15 mL via OROMUCOSAL

## 2018-02-12 MED ORDER — FAMOTIDINE 40 MG/5ML PO SUSR: 20.0000 mg | Freq: Every day | ORAL | Status: DC

## 2018-02-12 MED ORDER — SODIUM CHLORIDE 0.9 % IV SOLN
1.0000 g | INTRAVENOUS | Status: DC
  Administered 2018-02-12 – 2018-02-13 (×2): 1 g via INTRAVENOUS
  Filled 2018-02-12 (×2): qty 1

## 2018-02-12 MED ORDER — PSYLLIUM 95 % PO PACK
1.0000 | PACK | Freq: Two times a day (BID) | ORAL | Status: DC
  Administered 2018-02-12 – 2018-02-13 (×2): 1 via ORAL
  Filled 2018-02-12 (×5): qty 1

## 2018-02-12 MED ORDER — TRAZODONE HCL 50 MG PO TABS
100.0000 mg | ORAL_TABLET | Freq: Every day | ORAL | Status: DC
  Filled 2018-02-12: qty 2

## 2018-02-12 MED ORDER — FENTANYL CITRATE (PF) 100 MCG/2ML IJ SOLN
25.0000 ug | INTRAMUSCULAR | Status: DC | PRN
  Administered 2018-02-12: 25 ug via INTRAVENOUS
  Filled 2018-02-12: qty 2

## 2018-02-12 MED ORDER — CHLORHEXIDINE GLUCONATE 0.12 % MT SOLN
15.0000 mL | Freq: Two times a day (BID) | OROMUCOSAL | Status: DC
  Administered 2018-02-12 – 2018-02-13 (×2): 15 mL via OROMUCOSAL
  Filled 2018-02-12: qty 15

## 2018-02-12 MED ORDER — GUAIFENESIN-DM 100-10 MG/5ML PO SYRP
15.0000 mL | ORAL_SOLUTION | ORAL | Status: DC | PRN
  Administered 2018-02-12: 15 mL via ORAL
  Filled 2018-02-12: qty 15

## 2018-02-12 NOTE — Procedures (Signed)
Extubation Procedure Note  Patient Details:   Name: George Washington. DOB: 1940/07/25 MRN: [id number]   Airway Documentation:    Vent end date: 02/12/18 Vent end time: 1233   Evaluation  O2 sats: stable throughout Complications: No apparent complications Patient did tolerate procedure well. Bilateral Breath Sounds: Clear, Diminished   Yes   Patient extubated to 4L [place] without complications. Positive cuff leak noted. RN at bedside. Will continue to monitor.  Herbie Baltimore 02/12/2018, 12:47 PM

## 2018-02-12 NOTE — Progress Notes (Addendum)
  Advanced Heart Failure Rounding Note  PCP-Cardiologist: Muhammad Arida, MD   Subjective:    Had PEA arrest on 11/28. Code blue x 2. Had CPR and epi with prompt ROSC. Co-ox pre-code 47% Intubated and taken to cath lab. IABP and swan placed.   On 11/29 developed fevers and thick tan secretions concerning for PNA. Cultures drawn. Started on vanc and cefipime. PCT 0.23-> 2.83 -> 6.72. BCx NGTD. Sputum moderate pseudomonas and staph.   Remains intubated. Awake on vent and follows commands. Tolerated vent wean for 7 hours yesterday, then got anxious. FiO2 30%  Remains on NE 22 > 16. Co-ox 54% -> 70% -> 73%. Lasix stopped yesterday.  Run of SVT 150s this am.   Creatinine 2.3-> 2.9 -> 2.6 -> 1.97 -> 2.22  Swan numbers:  CVP 7 PA 44/16 PCWP - RN not wedging (overwedges and similar to PAD) Thermo 6.9/3.81 Co-ox 73%   Objective:   Weight Range: 67.5 kg Body mass index is 24.02 kg/m.   Vital Signs:   Temp:  [98.6 F (37 C)-99.7 F (37.6 C)] 98.8 F (37.1 C) (12/02 0715) Pulse Rate:  [87-154] 92 (12/02 0715) Resp:  [12-30] 21 (12/02 0715) BP: (95-125)/(44-90) 125/44 (12/02 0630) SpO2:  [79 %-100 %] 99 % (12/02 0715) Arterial Line BP: (86-121)/(40-65) 108/50 (12/02 0715) FiO2 (%):  [30 %] 30 % (12/02 0352) Weight:  [67.5 kg] 67.5 kg (12/02 0600) Last BM Date: 02/11/18  Weight change: Filed Weights   02/10/18 0500 02/11/18 0400 02/12/18 0600  Weight: 71 kg 68.3 kg 67.5 kg    Intake/Output:   Intake/Output Summary (Last 24 hours) at 02/12/2018 0729 Last data filed at 02/12/2018 0600 Gross per 24 hour  Intake 3409.56 ml  Output 4050 ml  Net -640.44 ml      Physical Exam    General: Intubated. Awake HEENT: + ETT Neck: Supple. JVP ~8. Carotids 2+ bilat; no bruits. No thyromegaly or nodule noted. Cor: PMI nondisplaced. RRR, soft AS Lungs: Diminished basilar sounds Abdomen: Soft, non-tender, non-distended, no HSM. No bruits or masses. +BS  Extremities: No  cyanosis, clubbing, or rash. Trace ankle edema. Left groin IABP and swan.  Neuro: Intubated. Awake. Follow commands.  GI: rectal tube in place  Telemetry   NSR 90-100s. Short run of SVT this am. Personally reviewed.   EKG    N/a   Labs    CBC Recent Labs    02/11/18 0345 02/12/18 0413  WBC 3.6* 5.3  NEUTROABS 2.8 2.8  HGB 9.1* 8.4*  HCT 29.1* 28.2*  MCV 83.6 84.9  PLT 161 158   Basic Metabolic Panel Recent Labs    02/11/18 0345 02/12/18 0413  NA 137 138  K 3.2* 4.0  CL 96* 102  CO2 25 30  GLUCOSE 86 146*  BUN 44* 50*  CREATININE 1.97* 2.22*  CALCIUM 7.9* 7.7*  MG 2.1 2.1  PHOS  --  2.9   Liver Function Tests No results for input(s): AST, ALT, ALKPHOS, BILITOT, PROT, ALBUMIN in the last 72 hours. No results for input(s): LIPASE, AMYLASE in the last 72 hours. Cardiac Enzymes No results for input(s): CKTOTAL, CKMB, CKMBINDEX, TROPONINI in the last 72 hours.  BNP: BNP (last 3 results) Recent Labs    02/06/18 0126 01/14/2018 0511  BNP >4,500.0* 2,385.1*    ProBNP (last 3 results) No results for input(s): PROBNP in the last 8760 hours.   D-Dimer No results for input(s): DDIMER in the last 72 hours. Hemoglobin A1C No results   for input(s): HGBA1C in the last 72 hours. Fasting Lipid Panel No results for input(s): CHOL, HDL, LDLCALC, TRIG, CHOLHDL, LDLDIRECT in the last 72 hours. Thyroid Function Tests No results for input(s): TSH, T4TOTAL, T3FREE, THYROIDAB in the last 72 hours.  Invalid input(s): FREET3  Other results:   Imaging    Dg Chest Port 1 View  Result Date: 02/11/2018 CLINICAL DATA:  Central line placement EXAM: PORTABLE CHEST 1 VIEW COMPARISON:  Chest radiograph from one day prior. FINDINGS: Endotracheal tube tip is 4.7 cm above the carina. Enteric tube enters stomach with the tip not seen on this image. Inferior approach Swan-Ganz catheter terminates over right infrahilar region, unchanged. Intra-aortic balloon pump marker overlies the  descending thoracic aorta at T6-7 level. Right PICC terminates at the cavoatrial junction. Stable cardiomediastinal silhouette with mild cardiomegaly. No pneumothorax. Small bilateral pleural effusions, stable. Stable mild pulmonary edema and bibasilar atelectasis. IMPRESSION: 1. Intra-aortic balloon pump marker overlies the descending thoracic aorta at the T6-7 level. 2. Support structures as detailed, noting inferior approach Swan-Ganz catheter terminating in the right infrahilar region, unchanged, consider retracting. No pneumothorax. 3. Stable mild congestive heart failure with small bilateral pleural effusions and bibasilar atelectasis. Electronically Signed   By: Ilona Sorrel M.D.   On: 02/11/2018 10:41     Medications:     Scheduled Medications: . aspirin  81 mg Per Tube Daily  . atorvastatin  80 mg Oral q1800  . budesonide (PULMICORT) nebulizer solution  0.25 mg Nebulization BID  . chlorhexidine gluconate (MEDLINE KIT)  15 mL Mouth Rinse BID  . Chlorhexidine Gluconate Cloth  6 each Topical Daily  . docusate  100 mg Oral BID  . famotidine  20 mg Per Tube Daily  . mouth rinse  15 mL Mouth Rinse 10 times per day  . nicotine  21 mg Transdermal Daily  . sennosides  5 mL Oral BID  . sodium chloride flush  10-40 mL Intracatheter Q12H  . traZODone  100 mg Per Tube QHS    Infusions: . sodium chloride 10 mL/hr at 02/12/18 0600  . sodium chloride 10 mL/hr at 02/12/18 0600  . sodium chloride 10 mL/hr at 02/12/18 0600  . ceFEPime (MAXIPIME) IV Stopped (02/11/18 1341)  . feeding supplement (VITAL AF 1.2 CAL) 1,500 mL (02/11/18 2200)  . fentaNYL infusion INTRAVENOUS 100 mcg/hr (02/12/18 0600)  . ferumoxytol 468 mL/hr at 02/11/18 1800  . heparin 1,400 Units/hr (02/12/18 0600)  . norepinephrine (LEVOPHED) Adult infusion 22 mcg/min (02/12/18 0600)  . vancomycin Stopped (02/11/18 1010)    PRN Medications: sodium chloride, sodium chloride, sodium chloride, acetaminophen, bisacodyl, fentaNYL,  ipratropium-albuterol, midazolam, nitroGLYCERIN, ondansetron, ondansetron (ZOFRAN) IV, polyvinyl alcohol, sodium chloride flush    Patient Profile   [name] is a 77 year old with a history of recently diagnosed systolic heart failure earlier this month, HTN, iron deficiency,CKD III and tobacco abuse.    Admitted from [facility] for CT surgery and structural heart consultation this is complicated by marked volume overload and worsening renal function.   Assessment/Plan   1. PEA arrest x 2 on 11/28 - s/p CPR and EPI with prompt ROSC. IABP and swan placed - Remains intubated but neurologically intact. No change.   2. Acute Systolic Heart Failure -> cardiogenic shock , ICM/NICM with valvular disease   - ECHO 01/2013 EF ~30%. Severe AS. - Now with IABP in place. Co-ox improved on NE 16 and IABP. Will wean slowly as toelrated - Volume status stable. Lasix on hold.  Be careful not to overdiurese with severe AS and CKD. CVP 7 this am.  - No BB with shock - No Arb, dig, spiro with elevated creatinine and shock .  - Continue heparin for IABP. Hgb 9.1 > 8.4.  3. Acute on chronic hypoxic respiratory failure s/p cardiac arrest - now intubated. CCM managing vent. (D/w Dr Nelda Marseille) Rich Brave as tolerated. FiO2 30% this am. Struggled with vent wean yesterday. Good from volume status. May need more time with HCAP.  - severe COPD at baseline with PFTs 02/06/18 FEV1 0.94L (32%), FVC 1.48 (36%) DLCO 38%  4. Probable HCAP in setting of cardiac arrest. - PCT up to 6.72 - Initial sputum cx = moderate pseudomonas and moderate staph aureus - Continue vanc/cefipime. Await sensitivities. Should have good coverage for now.   5. CAD - LHC 01/31/2018 with Ost RCA to Prox RCA lesion 100% stenosed, Prox CX lesion 70%, and mid Cx lesion 30% stenosed. Not CABG canddiate - Continue aspirin and statin. No change.    6. Severe Aortic Stenosis  - Structural heart team with Dr Angelena Form and Dr Roxy Manns following for possible TAVR.  -  Will continue to pursue TAVR if/when creatinine improves. Will need pre-op CTs when renal function permits - Creatinine now under 2.22 but has HCAP. Will need further stabilization prior to TAVR consideration  7. AKI with CKD Stage III-IV - likely ATN -Creatinine 1.9>2.1-> 2.3 -> 2.9 -> 2.6 -> 1.97 -> 2.22. Improving with hemodynamic support  7. MR  - Moderate by echo. Hopefully will improve with diuresis and treatment of HF. No change.   8. Anemia  - Iron sats 9% on 01/23/2018 .  - Received feraheme 11/26 - Hgb 9.1 > 8.4  9. Hyponatremia.   Sodium 126-> 129  -> 137 -> 138  10. PVCs - improved - keep K > 4.0 Mg > 2.0 - K 4.0, mag 2.1  11. Diarrhea - likely from TFs - C diff negative.   Length of Stay: [place], NP  02/12/2018, 7:29 AM  Advanced Heart Failure Team Pager [phone] (M-F; 7a - 4p)  Please contact [facility] for night-coverage after hours (4p -7a ) and weekends on [email or web address]  Patient seen with NP, agree with the above note.   He is awake/alert on vent.  Unable to extubate yesterday.  CVP 7, co-ox 73%, remains on IABP 1:1 + norepinephrine 16.  On vanco/cefepime with Staph aureus and Pseudomonas in sputum. PCT 6.72. Creatinine up to 2.22.   CXR with LLL consolidation.   On exam, Awake/alert, no JVD.  3/6 SEM RUSB with IABP sounds also.  Decreased BS at bases.  No edema.  IABP site stable.   1. PEA arrest x 2 on 11/28: In setting of severe AS, acute/on chronic systolic CHF, and severe COPD.  s/p CPR and EPI with prompt ROSC. IABP and swan placed - Remains intubated but neurologically intact. No change.  2. Aortic stenosis: Low gradient severe AS by cath and echo.  With low EF, renal dysfunction, and severe COPD, CABG/AVR is prohibitive and TAVR will be high risk and not sure that we will make it there.  We need to improve volume status and renal function prior to procedure.   - If we can get renal function to improve, will need scans for TAVR.  3.  Mitral regurgitation: Moderate by 11/14 echo, hopefully would improve with TAVR.  4. CAD: Cath 11/19 with chronically occluded RCA and 70% LCx (LCx is not  a critical lesion). No chest pain.  Would aim for medical management at this time.  - Continue ASA 81 and statin. 5. Acute on chronic systolic CHF: Echo (11/19) with EF 20-25% in setting of low gradient severe AS.  Suspect mixed cardiomyopathy from CAD (occluded RCA) and severe AS.  Resolution of AS may help LV systolic function.  CVP 7, current volume status appears optimized.  Good cardiac index by thermodilution and co-ox 73%.  Remains on IABP 1:1 and norepinephrine 16.  - No Lasix today.  - Wean norepinephrine as BP tolerates.  - Continue IABP, on heparin gtt with IABP.  6. AKI on CKD 3: Creatinine up to 2.22 today, baseline may be around 1.5-1.6.  No diuresis today with CVP 7.  7. Fe deficiency anemia: Got feraheme.  Hgb trending down to 8.4 (transfuse < 8).   8. Hyponatremia: Fluid restrict.  9. F/E/N: Tube feeds while intubated.  10. ID: Suspect LLL PNA.  Sputum with S aureus and Pseudomonas.  PCT 6.72 today.  - Continue vancomycin and cefepime today.  11. COPD: Severe by PFTs.  12. Acute hypoxemic respiratory failure: Will attempt wean again today.  Volume status appears optimized, suspect PNA and COPD are slowing weaning.   CRITICAL CARE Performed by: Dalton McLean  Total critical care time: 40 minutes  Critical care time was exclusive of separately billable procedures and treating other patients.  Critical care was necessary to treat or prevent imminent or life-threatening deterioration.  Critical care was time spent personally by me on the following activities: development of treatment plan with patient and/or surrogate as well as nursing, discussions with consultants, evaluation of patient's response to treatment, examination of patient, obtaining history from patient or surrogate, ordering and performing treatments and  interventions, ordering and review of laboratory studies, ordering and review of radiographic studies, pulse oximetry and [place]isman-evaluation of patient's condition.  Dalton McLean 02/12/2018 8:26 AM   

## 2018-02-12 NOTE — Progress Notes (Signed)
ANTICOAGULATION CONSULT NOTE  Pharmacy Consult for Heparin Indication: IABP balloon pump  Patient Measurements: Height: 5\' 6"  (167.6 cm) Weight: 148 lb 13 oz (67.5 kg) IBW/kg (Calculated) : 63.8 HEPARIN DW (KG): 74.7  Vital Signs: Temp: 98.6 F (37 C) (12/02 0900) BP: 119/46 (12/02 0820) Pulse Rate: 97 (12/02 0900)  Labs: Recent Labs    02/10/18 0256  02/11/18 0345  02/11/18 1511 02/12/18 0043 02/12/18 0413 02/12/18 0818  HGB 9.1*  --  9.1*  --   --   --  8.4*  --   HCT 29.8*  --  29.1*  --   --   --  28.2*  --   PLT 191  --  161  --   --   --  158  --   HEPARINUNFRC  --    < >  --    < > 0.15* 0.62  --  0.69  CREATININE 2.60*  --  1.97*  --   --   --  2.22*  --    < > = values in this interval not displayed.    Estimated Creatinine Clearance: 25.1 mL/min (A) (by C-G formula based on SCr of 2.22 mg/dL (H)).  Assessment: Pt is a 23 yoM s/p PEA arrest this morning who went for balloon pump. Pharmacy has been consulted for heparin management.  Heparin level is now supratherapeutic at 0.69, on 1400 units/hr. Hgb 8.4, plt 158. No s/sx of bleeding. No infusion issues.  Goal of Therapy:  Heparin level 0.2-0.5 units/ml Monitor platelets by anticoagulation protocol: Yes   Plan:  Decrease heparin infusion to 1300 units/hr Check an 8 hr heparin level Daily heparin level and CBC  Antonietta Jewel, PharmD, BCCCP Clinical Pharmacist  Pager: [phone] Phone: [phone] Please see AMION for all pharmacy numbers 02/12/2018 9:27 AM

## 2018-02-12 NOTE — Progress Notes (Signed)
NAME:  Adriell Polansky., MRN:  [id number], DOB:  1940/08/24, LOS: 7 ADMISSION DATE:  01/16/2018, CONSULTATION DATE:  01/12/2018 REFERRING MD:  Ellyn Hack - Cardiology, CHIEF COMPLAINT:  Respiratory failure.   HPI/course in hospital  77 year old mand with PEA cardiac arrest 11/28  intubated and taken to cath lab for IABP. Neurologically intact, brief arrest  Patient has known severe AS with LV systolic dysfunction.   Hospital events  57/60: 77 year old mand with PEA cardiac arrest 11/28  intubated and taken to cath lab for IABP. Neurologically intact, brief arrest 11/29: weaning pressors. Still on IABP. Getting diuresis  11/30: Renal function slightly improved. Minimally net negative. Still requiring pressors.  Interim history/subjective:  No overnight events.  Started on antibiotics for fever over the weekend.   Objective  (hemodynamics and vitals reviewed)  Blood pressure (!) 108/46, pulse 97, temperature 98.6 F (37 C), resp. rate 19, height 5\' 6"  (1.676 m), weight 67.5 kg, SpO2 98 %. PAP: (28-51)/(9-26) 34/12 CVP:  [1 mmHg-26 mmHg] 4 mmHg PCWP:  [16 mmHg-17 mmHg] 17 mmHg CO:  [6.3 L/min-7.9 L/min] 6.9 L/min CI:  [3.5 L/min/m2-4.4 L/min/m2] 3.8 L/min/m2  Vent Mode: PSV;CPAP FiO2 (%):  [30 %] 30 % Set Rate:  [15 bmp] 15 bmp Vt Set:  [510 mL] 510 mL PEEP:  [5 cmH20] 5 cmH20 Pressure Support:  [8 cmH20-10 cmH20] 10 cmH20 Plateau Pressure:  [10 cmH20-24 cmH20] 16 cmH20   Intake/Output Summary (Last 24 hours) at 02/12/2018 1053 Last data filed at 02/12/2018 1000 Gross per 24 hour  Intake 3086.62 ml  Output 3290 ml  Net -203.38 ml   Filed Weights   02/10/18 0500 02/11/18 0400 02/12/18 0600  Weight: 71 kg 68.3 kg 67.5 kg    PAP: (28-51)/(9-26) 34/12 CVP:  [1 mmHg-26 mmHg] 4 mmHg PCWP:  [16 mmHg-17 mmHg] 17 mmHg CO:  [6.3 L/min-7.9 L/min] 6.9 L/min CI:  [3.5 L/min/m2-4.4 L/min/m2] 3.8 L/min/m2   Physical exam   General: thin man in no distress.  HEENT: ETT and OGT in  place Pulmonary: chest clear Cardiac: IABP heard, RRR, coarse systolic murmur audible over the LUSB. Extremities warm with near normal capillary refill. Abdomen: Soft, NT, ND and +BS Extremities: -edema and -tenderness Neuro: Sedate but easily arousable and following commands Skin: Line sites intact.   Ancillary testing:   BMP Latest Ref Rng & Units 02/12/2018 02/11/2018 02/10/2018  Glucose 70 - 99 mg/dL 146(H) 86 136(H)  BUN 8 - 23 mg/dL 50(H) 44(H) 43(H)  Creatinine 0.61 - 1.24 mg/dL 2.22(H) 1.97(H) 2.60(H)  Sodium 135 - 145 mmol/L 138 137 131(L)  Potassium 3.5 - 5.1 mmol/L 4.0 3.2(L) 3.4(L)  Chloride 98 - 111 mmol/L 102 96(L) 89(L)  CO2 22 - 32 mmol/L 30 25 31   Calcium 8.9 - 10.3 mg/dL 7.7(L) 7.9(L) 8.4(L)   CBC Latest Ref Rng & Units 02/12/2018 02/11/2018 02/10/2018  WBC 4.0 - 10.5 K/uL 5.3 3.6(L) 7.4  Hemoglobin 13.0 - 17.0 g/dL 8.4(L) 9.1(L) 9.1(L)  Hematocrit 39.0 - 52.0 % 28.2(L) 29.1(L) 29.8(L)  Platelets 150 - 400 K/uL 158 161 191   Microbiology: 11/30 few WBC moderate Pseudomonas, moderate MRSA.  Assessment & Plan:   S/p PEA arrest; Now in cardiogenic shock from severe AS.  ECHO 30% w/ severe AS H/o CAD Improved hemodynamics with IABP Plan Cont on-going hemodynamic support as directed by advanced heart failure team For TAVR once creatinine improves, still several days away. Needs planning CT's. ASA and statin as ordered  Acute  Hypoxic respiratory failure  H/o COPD FEV1 32% at baseline Plan Tolerated SBT - ready for extubation. TAVR not soon. Discussed with Dr Aundra Dubin - will try extubating patient.  Monitor closely post extubation - may require BiPAP support.   AKI with CKD stage IV: baseline cr 1.9 Plan No diuresis today given near normal filling pressures  Possible HCAP Complete 7 days for Cefepime and Vancomycin.  Best practice:  Diet: Tube feeds on hold for extubation. Swallow evaluation post. Pain/Anxiety/Delirium protocol (if indicated): versed and  fentanyl VAP protocol (if indicated): in place DVT prophylaxis: heparin for IABP GI prophylaxis: famotidine Glucose control: euglycemic Mobility: bedrest Code Status: DNR if Hagstrom-arrests Family Communication: no family present Disposition: ICU  Critical Care time: 64 min   Kipp Brood, MD [facility] ICU Physician [place]  Pager: [phone] Mobile: [phone] After hours: [phone].

## 2018-02-12 NOTE — Progress Notes (Signed)
ANTICOAGULATION CONSULT NOTE  Pharmacy Consult for Heparin Indication: IABP balloon pump  Patient Measurements: Height: 5\' 6"  (167.6 cm) Weight: 148 lb 13 oz (67.5 kg) IBW/kg (Calculated) : 63.8 HEPARIN DW (KG): 74.7  Vital Signs: Temp: 99 F (37.2 C) (12/02 1945) BP: 105/59 (12/02 1800) Pulse Rate: 109 (12/02 1945)  Labs: Recent Labs    02/10/18 0256  02/11/18 0345  02/12/18 0043 02/12/18 0413 02/12/18 0818 02/12/18 1920  HGB 9.1*  --  9.1*  --   --  8.4*  --   --   HCT 29.8*  --  29.1*  --   --  28.2*  --   --   PLT 191  --  161  --   --  158  --   --   HEPARINUNFRC  --    < >  --    < > 0.62  --  0.69 0.49  CREATININE 2.60*  --  1.97*  --   --  2.22*  --   --    < > = values in this interval not displayed.    Estimated Creatinine Clearance: 25.1 mL/min (A) (by C-G formula based on SCr of 2.22 mg/dL (H)).  Assessment: Pt is a 34 yoM s/p PEA arrest who went for balloon pump placement. Pharmacy has been consulted for heparin management.  Heparin level now at goal at 0.49.  Goal of Therapy:  Heparin level 0.2-0.5 units/ml Monitor platelets by anticoagulation protocol: Yes   Plan:  Continue heparin infusion at 1300 units/hr Daily heparin level and CBC  Arrie Senate, PharmD, BCPS Clinical Pharmacist [phone] Please check AMION for all [place] numbers 02/12/2018

## 2018-02-13 ENCOUNTER — Inpatient Hospital Stay (HOSPITAL_COMMUNITY): Payer: Medicare Other

## 2018-02-13 LAB — BASIC METABOLIC PANEL
ANION GAP: 11 (ref 5–15)
ANION GAP: 9 (ref 5–15)
BUN: 49 mg/dL — ABNORMAL HIGH (ref 8–23)
BUN: 60 mg/dL — ABNORMAL HIGH (ref 8–23)
CO2: 23 mmol/L (ref 22–32)
CO2: 22 mmol/L (ref 22–32)
Calcium: 7.9 mg/dL — ABNORMAL LOW (ref 8.9–10.3)
Calcium: 7.7 mg/dL — ABNORMAL LOW (ref 8.9–10.3)
Chloride: 101 mmol/L (ref 98–111)
Chloride: 100 mmol/L (ref 98–111)
Creatinine, Ser: 2.33 mg/dL — ABNORMAL HIGH (ref 0.61–1.24)
Creatinine, Ser: 2.53 mg/dL — ABNORMAL HIGH (ref 0.61–1.24)
GFR calc Af Amer: 27 mL/min — ABNORMAL LOW (ref >60)
GFR calc non Af Amer: 26 mL/min — ABNORMAL LOW (ref >60)
GFR calc non Af Amer: 24 mL/min — ABNORMAL LOW (ref >60)
GFR, EST AFRICAN AMERICAN: 30 mL/min — AB (ref >60)
Glucose, Bld: 141 mg/dL — ABNORMAL HIGH (ref 70–99)
Glucose, Bld: 144 mg/dL — ABNORMAL HIGH (ref 70–99)
POTASSIUM: 3.5 mmol/L (ref 3.5–5.1)
Potassium: 4.3 mmol/L (ref 3.5–5.1)
Sodium: 135 mmol/L (ref 135–145)
Sodium: 131 mmol/L — ABNORMAL LOW (ref 135–145)

## 2018-02-13 LAB — CBC WITH DIFFERENTIAL/PLATELET
Basophils Absolute: 0 10*3/uL (ref 0.0–0.1)
Basophils Relative: 0 %
Eosinophils Absolute: 1.2 10*3/uL — ABNORMAL HIGH (ref 0.0–0.5)
Eosinophils Relative: 19 %
HCT: 24.4 % — ABNORMAL LOW (ref 39.0–52.0)
Hemoglobin: 7.3 g/dL — ABNORMAL LOW (ref 13.0–17.0)
Lymphocytes Relative: 7 %
Lymphs Abs: 0.5 10*3/uL — ABNORMAL LOW (ref 0.7–4.0)
MCH: 25.5 pg — ABNORMAL LOW (ref 26.0–34.0)
MCHC: 29.9 g/dL — AB (ref 30.0–36.0)
MCV: 85.3 fL (ref 80.0–100.0)
Monocytes Absolute: 0.6 10*3/uL (ref 0.1–1.0)
Monocytes Relative: 10 %
Neutro Abs: 4.2 10*3/uL (ref 1.7–7.7)
Neutrophils Relative %: 64 %
Platelets: 140 10*3/uL — ABNORMAL LOW (ref 150–400)
RBC: 2.86 MIL/uL — ABNORMAL LOW (ref 4.22–5.81)
RDW: 18.2 % — ABNORMAL HIGH (ref 11.5–15.5)
WBC Morphology: INCREASED
WBC: 6.6 10*3/uL (ref 4.0–10.5)
nRBC: 0 % (ref 0.0–0.2)

## 2018-02-13 LAB — COOXEMETRY PANEL
Carboxyhemoglobin: 1.5 % (ref 0.5–1.5)
Carboxyhemoglobin: 1.8 % — ABNORMAL HIGH (ref 0.5–1.5)
Methemoglobin: 1.9 % — ABNORMAL HIGH (ref 0.0–1.5)
Methemoglobin: 1.3 % (ref 0.0–1.5)
O2 Saturation: 58.2 %
O2 Saturation: 44.2 %
Total hemoglobin: 7.6 g/dL — ABNORMAL LOW (ref 12.0–16.0)
Total hemoglobin: 10 g/dL — ABNORMAL LOW (ref 12.0–16.0)

## 2018-02-13 LAB — MAGNESIUM: Magnesium: 2.1 mg/dL (ref 1.7–2.4)

## 2018-02-13 LAB — GLUCOSE, CAPILLARY: Glucose-Capillary: 152 mg/dL — ABNORMAL HIGH (ref 70–99)

## 2018-02-13 LAB — TROPONIN I
TROPONIN I: 2.24 ng/mL — AB (ref <0.03)
Troponin I: 1.8 ng/mL (ref <0.03)

## 2018-02-13 LAB — HEPARIN LEVEL (UNFRACTIONATED): Heparin Unfractionated: 0.49 IU/mL (ref 0.30–0.70)

## 2018-02-13 LAB — PREPARE RBC (CROSSMATCH)

## 2018-02-13 LAB — ABO/RH: ABO/RH(D): O POS

## 2018-02-13 MED ORDER — FUROSEMIDE 10 MG/ML IJ SOLN
40.0000 mg | Freq: Once | INTRAMUSCULAR | Status: AC
  Administered 2018-02-13: 40 mg via INTRAVENOUS
  Filled 2018-02-13: qty 4

## 2018-02-13 MED ORDER — POTASSIUM CHLORIDE 10 MEQ/50ML IV SOLN
10.0000 meq | INTRAVENOUS | Status: AC
  Administered 2018-02-13 (×3): 10 meq via INTRAVENOUS
  Filled 2018-02-13 (×3): qty 50

## 2018-02-13 MED ORDER — EPINEPHRINE PF 1 MG/ML IJ SOLN
0.5000 ug/min | INTRAVENOUS | Status: DC
  Filled 2018-02-13: qty 4

## 2018-02-13 MED ORDER — DOCUSATE SODIUM 100 MG PO CAPS: 100.0000 mg | ORAL_CAPSULE | Freq: Two times a day (BID) | ORAL | Status: DC

## 2018-02-13 MED ORDER — POTASSIUM CHLORIDE 10 MEQ/50ML IV SOLN: 10.0000 meq | INTRAVENOUS | Status: DC

## 2018-02-13 MED ORDER — INSULIN ASPART 100 UNIT/ML ~~LOC~~ SOLN: 3.0000 [IU] | Freq: Three times a day (TID) | SUBCUTANEOUS | Status: DC

## 2018-02-13 MED ORDER — SENNA 8.6 MG PO TABS: 1.0000 | ORAL_TABLET | Freq: Two times a day (BID) | ORAL | Status: DC

## 2018-02-13 MED ORDER — INSULIN ASPART 100 UNIT/ML ~~LOC~~ SOLN: 0.0000 [IU] | Freq: Every day | SUBCUTANEOUS | Status: DC

## 2018-02-13 MED ORDER — FUROSEMIDE 10 MG/ML IJ SOLN
INTRAMUSCULAR | Status: AC
  Filled 2018-02-13: qty 4

## 2018-02-13 MED ORDER — MILRINONE LACTATE IN DEXTROSE 20-5 MG/100ML-% IV SOLN: 0.2500 ug/kg/min | INTRAVENOUS | Status: DC

## 2018-02-13 MED ORDER — INSULIN ASPART 100 UNIT/ML ~~LOC~~ SOLN
0.0000 [IU] | Freq: Three times a day (TID) | SUBCUTANEOUS | Status: DC
  Administered 2018-02-13: 3 [IU] via SUBCUTANEOUS

## 2018-02-13 MED ORDER — FAMOTIDINE 20 MG PO TABS
20.0000 mg | ORAL_TABLET | Freq: Every day | ORAL | Status: DC
  Administered 2018-02-13: 20 mg via ORAL
  Filled 2018-02-13: qty 1

## 2018-02-13 MED ORDER — SODIUM CHLORIDE 0.9% IV SOLUTION: Freq: Once | INTRAVENOUS | Status: DC

## 2018-02-13 MED ORDER — FUROSEMIDE 10 MG/ML IJ SOLN
40.0000 mg | Freq: Once | INTRAMUSCULAR | Status: AC
  Administered 2018-02-13: 40 mg via INTRAVENOUS

## 2018-02-13 MED FILL — Medication: Qty: 1 | Status: AC

## 2018-02-14 LAB — CULTURE, BLOOD (ROUTINE X 2)
Culture: NO GROWTH
Culture: NO GROWTH
Special Requests: ADEQUATE

## 2018-02-14 LAB — TYPE AND SCREEN
ABO/RH(D): O POS
Antibody Screen: NEGATIVE
Unit division: 0

## 2018-02-14 LAB — BPAM RBC
Blood Product Expiration Date: 201912302359
ISSUE DATE / TIME: 201912030803
Unit Type and Rh: 5100

## 2018-03-14 NOTE — Progress Notes (Signed)
Heart failure team notified that pt had increased work of breathing and orders given by Critical Care team. Waiting for heart failure team to come to beside. Will continue to monitor pt.

## 2018-03-14 NOTE — Progress Notes (Signed)
Paged by RN. Pt has had increased WOB and confusion. Now on BiPAP. CXR checked and showed some CHF and IABP in correct position. 40 mg IV lasix given by CCM with only 20 cc UOP. Remains sinus tach 110s and MAPs 60-70s on NE 18. Pt complaining of chest discomfort and feeling "shocky". Appears uncomfortable. Has some upper airway congestion. CVP 6-7 when leved, CI 2.9.   Will check coox, another troponin, BMET, and ABG. Will give additional 40 mg IV lasix x1 now.   All of the above discussed with Dr Aundra Dubin.  Georgiana Shore, NP

## 2018-03-14 NOTE — Progress Notes (Signed)
   NAME:  George Hernon., MRN:  [id number], DOB:  Mar 09, 1941, LOS: 8 ADMISSION DATE:  01/30/2018, CONSULTATION DATE:  02/08/2018 REFERRING MD:  Ellyn Hack - Cardiology, CHIEF COMPLAINT:  Respiratory failure.   HPI/course in hospital  78 year old mand with PEA cardiac arrest 11/28  intubated and taken to cath lab for IABP. Neurologically intact, brief arrest  Patient has known severe AS with LV systolic dysfunction.   Hospital events  67/37: 78 year old mand with PEA cardiac arrest 11/28  intubated and taken to cath lab for IABP. Neurologically intact, brief arrest 11/29: weaning pressors. Still on IABP. Getting diuresis  11/30: Renal function slightly improved. Minimally net negative. Still requiring pressors. 12/2 extubated  Interim history/subjective:  Extubated 12/2 and tolerated well; however, late this AM had increased WOB after receiving 1u PRBC and some fluids.  Started on BiPAP.  Objective  (hemodynamics and vitals reviewed)  Blood pressure 104/76, pulse (!) 117, temperature 97.9 F (36.6 C), resp. rate (!) 36, height 5\' 6"  (1.676 m), weight 67.5 kg, SpO2 99 %. PAP: (30-60)/(13-31) 52/31 CVP:  [5 mmHg-17 mmHg] 7 mmHg PCWP:  [22 mmHg] 22 mmHg CO:  [5.9 L/min] 5.9 L/min CI:  [3.3 L/min/m2] 3.3 L/min/m2      Intake/Output Summary (Last 24 hours) at 02/16/2018 1346 Last data filed at 2018/02/16 1200 Gross per 24 hour  Intake 2151.63 ml  Output 1215 ml  Net 936.63 ml   Filed Weights   02/10/18 0500 02/11/18 0400 02/12/18 0600  Weight: 71 kg 68.3 kg 67.5 kg    PAP: (30-60)/(13-31) 52/31 CVP:  [5 mmHg-17 mmHg] 7 mmHg PCWP:  [22 mmHg] 22 mmHg CO:  [5.9 L/min] 5.9 L/min CI:  [3.3 L/min/m2] 3.3 L/min/m2   Physical exam   General: Adult male, in mild respiratory distress.  HEENT: [place] / AT. MMM. Pulmonary: Crackles bilaterally. Cardiac: IABP heard, RRR, coarse systolic murmur audible over the LUSB. Abdomen: Soft, NT, ND and +BS. Extremities: -edema and -tenderness. Neuro:  A&O x 3, no deficits. Skin: Line sites intact.   Assessment & Plan:   S/p PEA arrest; Now in cardiogenic shock from severe AS - improved hemodynamics with IABP but still requiring pressors  ECHO 30% w/ severe AS H/o CAD Cont on-going hemodynamic support as directed by advanced heart failure team For TAVR once creatinine improves, still several days away. Needs planning CT's. ASA and statin as ordered  Acute Hypoxic respiratory failure - extubated 12/2 and initially tolerated well; however, had distress late AM 12/3 due to edema H/o COPD FEV1 32% at baseline Start BiPAP 40mg  lasix x 1 now If fails will require reintubation  AKI with CKD stage IV: baseline cr 1.9 40mg  x 1 as above Follow BMP  Possible HCAP Complete 7 days for Cefepime and Vancomycin  Anemia - s/p 1u PRBC today 12/3 Transfuse for Hgb < 8 Follow CBC  Rest per primary team.  Best practice:  Diet: heart healthy Pain/Anxiety/Delirium protocol (if indicated): none VAP protocol (if indicated): none DVT prophylaxis: heparin for IABP GI prophylaxis: famotidine Glucose control: euglycemic Mobility: bedrest Code Status: DNR if Zehner-arrests Family Communication: family updated 12/3 Disposition: ICU  Critical Care time: 31 min    Montey Hora, [place] - [facility] Pager: [phone]  or [phone] Feb 16, 2018, 2:01 PM

## 2018-03-14 NOTE — Progress Notes (Signed)
ANTICOAGULATION CONSULT NOTE  Pharmacy Consult for Heparin Indication: IABP balloon pump  Patient Measurements: Height: 5\' 6"  (167.6 cm) Weight: 148 lb 13 oz (67.5 kg) IBW/kg (Calculated) : 63.8 HEPARIN DW (KG): 74.7  Vital Signs: Temp: 98.4 F (36.9 C) (12/03 0630) Temp Source: Core (12/03 0400) BP: 108/50 (12/03 0600) Pulse Rate: 112 (12/03 0630)  Labs: Recent Labs    02/11/18 0345  02/12/18 0413 02/12/18 0818 02/12/18 1920 02/12/18 2021 Feb 25, 2018 0413  HGB 9.1*  --  8.4*  --   --   --  7.3*  HCT 29.1*  --  28.2*  --   --   --  24.4*  PLT 161  --  158  --   --   --  140*  HEPARINUNFRC  --    < >  --  0.69 0.49  --  0.49  CREATININE 1.97*  --  2.22*  --   --   --  2.33*  TROPONINI  --   --   --   --   --  0.57*  --    < > = values in this interval not displayed.    Estimated Creatinine Clearance: 24 mL/min (A) (by C-G formula based on SCr of 2.33 mg/dL (H)).  Assessment: Pt is a 50 yoM s/p PEA arrest who went for balloon pump placement. Pharmacy has been consulted for heparin management.  Heparin level remains therapeutic at 0.49 this morning, on 1300 units/hr. Hgb down to 7.3, plt 14 - plan for 1 unit of PRBC. No s/sx of bleeding. No infusion issues.   Goal of Therapy:  Heparin level 0.2-0.5 units/ml Monitor platelets by anticoagulation protocol: Yes   Plan:  Continue heparin infusion at 1300 units/hr Daily heparin level and CBC  Antonietta Jewel, PharmD, [place] Clinical Pharmacist  Pager: [phone] Phone: [phone] Please check AMION for all [facility] Pharmacy numbers 02-25-18

## 2018-03-14 NOTE — Progress Notes (Addendum)
Advanced Heart Failure Rounding Note  PCP-Cardiologist: Kathlyn Sacramento, MD   Subjective:    Had PEA arrest on 11/28. Code blue x 2. Had CPR and epi with prompt ROSC. Co-ox pre-code 47% Intubated and taken to cath lab. IABP and swan placed.   On 11/29 developed fevers and thick tan secretions concerning for PNA. PCT 0.23-> 2.83 -> 6.72. BCx NGTD. Sputum cx: positive for pseudomonas and MRSA. On cefepime and Vanc. Tmax 99.1  Extubated yesterday. Coox 58% this am. Lasix held since 12/1. CVP 6-7. Remains on NE @ 17. MAP 70s  Having mid sternal chest discomfort that started overnight, seems to be pleuritic. Troponin 0.57 (down from 8.27 pm 11/28). Coughing a lot with clear sputum.   Creatinine 2.3-> 2.9 -> 2.6 -> 1.97 -> 2.22 -> 2.33. Weight down 2 lbs overnight.  Hemoglobin 9.1 > 8.4 > 7.3. 1 unit of PRBCs ordered.  [place] numbers:  CVP 6-7 PA 40/20 (29) PCWP - RN not wedging (overwedges and similar to PAD) Thermo 5.88/ 3.25 Augmentation 90s on IABP 1:1  Objective:   Weight Range: 67.5 kg Body mass index is 24.02 kg/m.   Vital Signs:   Temp:  [98.2 F (36.8 C)-99.1 F (37.3 C)] 98.4 F (36.9 C) (12/03 0630) Pulse Rate:  [87-117] 112 (12/03 0630) Resp:  [6-32] 27 (12/03 0630) BP: (99-126)/(46-83) 108/50 (12/03 0600) SpO2:  [75 %-100 %] 97 % (12/03 0630) Arterial Line BP: (75-114)/(45-76) 80/55 (12/03 0630) FiO2 (%):  [30 %] 30 % (12/02 0916) Last BM Date: 02/12/18(stool in flexiseal)  Weight change: Filed Weights   02/10/18 0500 02/11/18 0400 02/12/18 0600  Weight: 71 kg 68.3 kg 67.5 kg    Intake/Output:   Intake/Output Summary (Last 24 hours) at 2018/03/10 0707 Last data filed at 2018-03-10 0600 Gross per 24 hour  Intake 2233.16 ml  Output 2325 ml  Net -91.84 ml      Physical Exam    General: No resp difficulty. HEENT: Normal Neck: Supple. JVP 6-7. Carotids 2+ bilat; no bruits. No thyromegaly or nodule noted. Cor: PMI nondisplaced. RRR, soft AS Lungs:  CTAB, normal effort. Abdomen: Soft, non-tender, non-distended, no HSM. No bruits or masses. +BS  Extremities: No cyanosis, clubbing, or rash. R and LLE trace edema. Left groin IABP and swan Neuro: Alert & orientedx3, cranial nerves grossly intact. moves all 4 extremities w/o difficulty. Affect pleasant GI: rectal tube in place  Telemetry   Sinus tach 100-110s. Personally reviewed.   EKG    N/a   Labs    CBC Recent Labs    02/12/18 0413 03/10/2018 0413  WBC 5.3 6.6  NEUTROABS 2.8 4.2  HGB 8.4* 7.3*  HCT 28.2* 24.4*  MCV 84.9 85.3  PLT 158 937*   Basic Metabolic Panel Recent Labs    02/11/18 0345 02/12/18 0413 03-10-18 0413  NA 137 138 135  K 3.2* 4.0 3.5  CL 96* 102 101  CO2 25 30 23   GLUCOSE 86 146* 141*  BUN 44* 50* 49*  CREATININE 1.97* 2.22* 2.33*  CALCIUM 7.9* 7.7* 7.9*  MG 2.1 2.1  --   PHOS  --  2.9  --    Liver Function Tests No results for input(s): AST, ALT, ALKPHOS, BILITOT, PROT, ALBUMIN in the last 72 hours. No results for input(s): LIPASE, AMYLASE in the last 72 hours. Cardiac Enzymes Recent Labs    02/12/18 2021  TROPONINI 0.57*    BNP: BNP (last 3 results) Recent Labs    02/06/18 0126  01/18/2018 0511  BNP >4,500.0* 2,385.1*    ProBNP (last 3 results) No results for input(s): PROBNP in the last 8760 hours.   D-Dimer No results for input(s): DDIMER in the last 72 hours. Hemoglobin A1C No results for input(s): HGBA1C in the last 72 hours. Fasting Lipid Panel No results for input(s): CHOL, HDL, LDLCALC, TRIG, CHOLHDL, LDLDIRECT in the last 72 hours. Thyroid Function Tests No results for input(s): TSH, T4TOTAL, T3FREE, THYROIDAB in the last 72 hours.  Invalid input(s): FREET3  Other results:   Imaging    No results found.   Medications:     Scheduled Medications: . sodium chloride   Intravenous Once  . aspirin  81 mg Oral Daily  . atorvastatin  80 mg Oral q1800  . budesonide (PULMICORT) nebulizer solution  0.25 mg  Nebulization BID  . chlorhexidine  15 mL Mouth Rinse BID  . Chlorhexidine Gluconate Cloth  6 each Topical Daily  . docusate  100 mg Oral BID  . famotidine  20 mg Oral Daily  . mouth rinse  15 mL Mouth Rinse q12n4p  . nicotine  21 mg Transdermal Daily  . psyllium  1 packet Oral BID  . sennosides  5 mL Oral BID  . sodium chloride flush  10-40 mL Intracatheter Q12H  . traZODone  100 mg Oral QHS    Infusions: . sodium chloride 250 mL (02/12/18 1732)  . sodium chloride 10 mL/hr at 06-Mar-2018 0600  . sodium chloride 10 mL/hr at 06-Mar-2018 0600  . ceFEPime (MAXIPIME) IV Stopped (02/12/18 1311)  . feeding supplement (VITAL AF 1.2 CAL) Stopped (02/12/18 1100)  . ferumoxytol 468 mL/hr at 02/11/18 1800  . heparin 1,300 Units/hr (03-06-2018 0600)  . norepinephrine (LEVOPHED) Adult infusion 17 mcg/min (Mar 06, 2018 0600)  . potassium chloride    . vancomycin Stopped (02/12/18 0948)    PRN Medications: sodium chloride, sodium chloride, sodium chloride, acetaminophen, ALPRAZolam, bisacodyl, fentaNYL (SUBLIMAZE) injection, guaiFENesin-dextromethorphan, ipratropium-albuterol, midazolam, nitroGLYCERIN, ondansetron, ondansetron (ZOFRAN) IV, polyvinyl alcohol, sodium chloride flush    Patient Profile   Mr Ellerman is a 78 year old with a history of recently diagnosed systolic heart failure earlier this month, HTN, iron deficiency,CKD III and tobacco abuse.    Admitted from [facility] for CT surgery and structural heart consultation this is complicated by marked volume overload and worsening renal function.   Assessment/Plan   1. PEA arrest x 2 on 11/28 - s/p CPR and EPI with prompt ROSC. IABP and swan placed - Extubated and neuro intact  2. Acute Systolic Heart Failure -> cardiogenic shock , ICM/NICM with valvular disease   - ECHO 01/2013 EF ~30%. Severe AS. - Now with IABP in place. Co-ox improved on NE 17 and IABP. Will wean slowly as toelrated - Volume status stable. Lasix on hold.  Be careful not to  overdiurese with severe AS and CKD. CVP 6-7 - No BB with shock - No Arb, dig, spiro with elevated creatinine and shock .  - Continue heparin for IABP. Hgb 9.1 > 8.4 > 7.3  3. Acute on chronic hypoxic respiratory failure s/p cardiac arrest - Extubated 12/2. On 3 L [place].  - severe COPD at baseline with PFTs 02/06/18 FEV1 0.94L (32%), FVC 1.48 (36%) DLCO 38%  4. Probable HCAP in setting of cardiac arrest. - PCT up to 6.72 yesterday.  - Sputum cx = pseudomonas and MRSA. On contact precautions - Blood cx NGTD. On cefepime and vanc.   5. CAD - LHC 01/31/2018 with Ost RCA to  Prox RCA lesion 100% stenosed, Prox CX lesion 70%, and mid Cx lesion 30% stenosed. Not CABG canddiate - Continue aspirin and statin.  - Having mid sternal CP. Trop down to 0.57. May be related to low hemoglobin vs coughing vs chest compressions.    6. Severe Aortic Stenosis  - Structural heart team with Dr Angelena Form and Dr Roxy Manns following for possible TAVR.  - Will continue to pursue TAVR if/when creatinine improves. Will need pre-op CTs when renal function permits - Creatinine now 2.33 but has HCAP. Will need further stabilization prior to TAVR consideration  7. AKI with CKD Stage III-IV - likely ATN -Creatinine 1.9>2.1-> 2.3 -> 2.9 -> 2.6 -> 1.97 -> 2.22 -> 2.33  7. MR  - Moderate by echo. Hopefully will improve with diuresis and treatment of HF. No change.  8. Anemia  - Iron sats 9% on 01/23/2018 .  - Received feraheme 11/26 - Hgb 9.1 > 8.4 > 7.3. 1 unit PRBCs ordered.  - Platelets 140  9. Hyponatremia.   Sodium 126-> 129  -> 137 -> 138 -> 135  10. PVCs - improved - keep K > 4.0 Mg > 2.0 - K 3.5. 30 meq supp ordered. Add on mag.   11. Diarrhea - C diff negative. No change.    Length of Stay: [place], NP  02/27/2018, 7:07 AM  Advanced Heart Failure Team Pager [phone] (M-F; 7a - 4p)  Please contact [facility] for night-coverage after hours (4p -7a ) and weekends on [email or web address]  Patient  seen with NP, agree with the above note.   Extubated yesterday.  Has some central chest pain worse with coughing, troponin trending down.  CVP 6-7, co-ox 58% but CI good by thermodilution at 3.3, remains on IABP 1:1 + norepinephrine 17.  On vanco/cefepime with MRSA and Pseudomonas in sputum. CXR with LLL PNA. Creatinine up to 2.33. Hgb down at 7.3, no overt bleeding.    On exam, Awake/alert, no JVD.  3/6 SEM RUSB with IABP sounds also.  Decreased BS at bases.  No edema.  IABP site stable.   1. PEA arrest x 2 on 11/28: In setting of severe AS, acute/on chronic systolic CHF, and severe COPD.  s/p CPR and EPI with prompt ROSC. IABP and swan placed - Remains intubated but neurologically intact. No change.  2. Aortic stenosis: Low gradient severe AS by cath and echo. With low EF, renal dysfunction, and severe COPD, CABG/AVR is prohibitive and TAVR will be high risk and not sure that we will make it there. We need to improve renal function prior to procedure.  - If we can get renal function to improve, will need scans for TAVR.  3. Mitral regurgitation: Moderate by 11/14 echo, hopefully would improve with TAVR.  4. CAD: Cath 11/19 with chronically occluded RCA and 70% LCx (LCx is not a critical lesion). Chest pain today but seems pleuritic, possibly from PNA.  Troponin trending down from 8 after arrest. Would aim for medical management at this time.  - Continue ASA 81 and statin. 5. Acute on chronic systolic CHF: Echo (95/63) with EF 20-25% in setting of low gradient severe AS. Suspect mixed cardiomyopathy from CAD (occluded RCA) and severe AS. Resolution of AS may help LV systolic function. CVP 6-7, current volume status appears optimized.  Good cardiac index by thermodilution though co-ox down a bit at 58%.  Remains on IABP 1:1 (augmenting properly) and norepinephrine 17.  I suspect that he has mixed  cardiogenic and septic (PNA) shock.  - No Lasix today.  - Wean norepinephrine today as BP  tolerates.  - Continue IABP w/o changes for now, on heparin gtt with IABP.  Once BP is more stable on less norepinephrine, will try to start weaning the pump, possibly tomorrow.  Can wean to milrinone.  6. AKI on CKD 3: Creatinine up to 2.33 today, baseline may be around 1.5-1.6. No diuresis today with CVP 6-7.  7. Fe deficiency anemia: Got feraheme.  Hgb trending down to 7.3 without overt bleeding.  - Will transfuse 1 unit PRBCs.  8. Hyponatremia: Fluid restrict. 9. ID: LLL PNA.  Sputum with MRSA and Pseudomonas.   - Continue vancomycin and cefepime.  10. COPD: Severe by PFTs.  11. Acute hypoxemic respiratory failure: Now extubated.  12. Thrombocytopenia: Likely related to IABP, mild.   CRITICAL CARE Performed by: Loralie Champagne  Total critical care time: 40 minutes  Critical care time was exclusive of separately billable procedures and treating other patients.  Critical care was necessary to treat or prevent imminent or life-threatening deterioration.  Critical care was time spent personally by me on the following activities: development of treatment plan with patient and/or surrogate as well as nursing, discussions with consultants, evaluation of patient's response to treatment, examination of patient, obtaining history from patient or surrogate, ordering and performing treatments and interventions, ordering and review of laboratory studies, ordering and review of radiographic studies, pulse oximetry and Kober-evaluation of patient's condition.  Loralie Champagne 2018-03-15 7:48 AM

## 2018-03-14 NOTE — Progress Notes (Signed)
Pt's IABP started alarming, nurse and RT went in to assess pt. Pt was agonal breathing with BiPAP on. RT immediatly started to bag pt and code was called. Please see code sheet for details.   Agarwala MD pronounced pt dead at 1546. Pt's family notified and in route to the hospital. [facility]Link notified CDS.  Wife returned to room to see pt. Personal belongings returned to wife. Funeral services given. Please see post mortem check list for more details.

## 2018-03-14 NOTE — Death Summary Note (Signed)
  Advanced Heart Failure Death Summary  Death Summary   Patient ID: Neal Dy. MRN: [id number], DOB/AGE: 1940-10-27 78 y.o. Admit date: 02/07/2018 D/C date:     14-Feb-2018   Primary Discharge Diagnoses:  1. PEA arrest 2. Aortic stenosis 3. Mitral regurgitation 4. CAD 5. Acute on chronic systolic HF 6. AKI on CKD 3 7. Fe deficiency anemia 8. Hyponatremia 9. ID: LLL PNA 10. Severe COPD 11. Acute hypoxemic respiratory failure 12. Thrombocytopneia  Hospital Course:  Marshun Duva. is a 78 y.o. male with a history of recently diagnosed systolic heart failure earlier this month, HTN, iron deficiency,CKD III and tobacco abuse.    Admitted to [facility] on 01/31/2018 with increased lower extremity edema and shortness of breath. He underwent RHC/LHC, which showed severe two vessel CAD with heavily calcified vessels, severe AS, elevated filling pressures, and reduced cardiac output. Discharged on 11/25 to [facility] for CT surgery/structural heart consultation.   Admitted to [facility] 11/25 for consideration of TAVR and CABG. He had sluggish diuresis with IV lasix with worsening renal function. Heart failure team was consulted and started him on milrinone.   He PEA arrested twice on 11/28. Both times able to achieve ROSC quickly with epi and CPR. Post arrest EKG showed new ST depressions in V3-V6. He was intubated during code. He was taken to cath lab and IABP and swan were placed. Milrinone was stopped and he was started on norepinephrine to support blood pressure. Hemodynamics initially improved and he diuresed well with IV lasix. AKI started to improve.  He developed fever with thick tan secretions on 11/29. Started on Vanc and Cefepime for pneumonia coverage. Sputum culture showed pseudomonas and MRSA. Blood cultures were negative.  He was successfully extubated on 12/2. IABP and norepinephrine were continued for cardiogenic shock. The goal was to allow renal function to recover enough to get CT scans to  evaluate for possible TAVR.  Unfortunately, he decompensated on 12/3. He became SOB with very little UOP, unresponsive to IV lasix or BiPAP. Hemodynamics appeared to be relatively stable by PA catheter. He then had another PEA arrest. Epi and bicarb were given with some improvement in HR, but he remained pulseless. He was intubated with no improvement. No compressions per patient's wishes. Dr Aundra Dubin pronounced time of death and updated family over the phone.   Every effort was made during this admission to improve the patient's clinical picture.   Duration of Discharge Encounter: Greater than 35 minutes   Signed, Georgiana Shore, NP 14-Feb-2018, 3:59 PM

## 2018-03-14 DEATH — deceased

## 2018-04-03 ENCOUNTER — Ambulatory Visit: Payer: Medicare Other | Admitting: Family Medicine

## 2018-04-26 ENCOUNTER — Ambulatory Visit: Payer: Medicare Other | Admitting: Family Medicine

## 2019-07-30 IMAGING — DX DG CHEST 1V PORT
1 series · 1 of 1 positions shown · non-contrast
Comparison: Frontal and lateral views 2 days prior

CLINICAL DATA: Post CPR.

EXAM:
PORTABLE CHEST 1 VIEW

[chest]
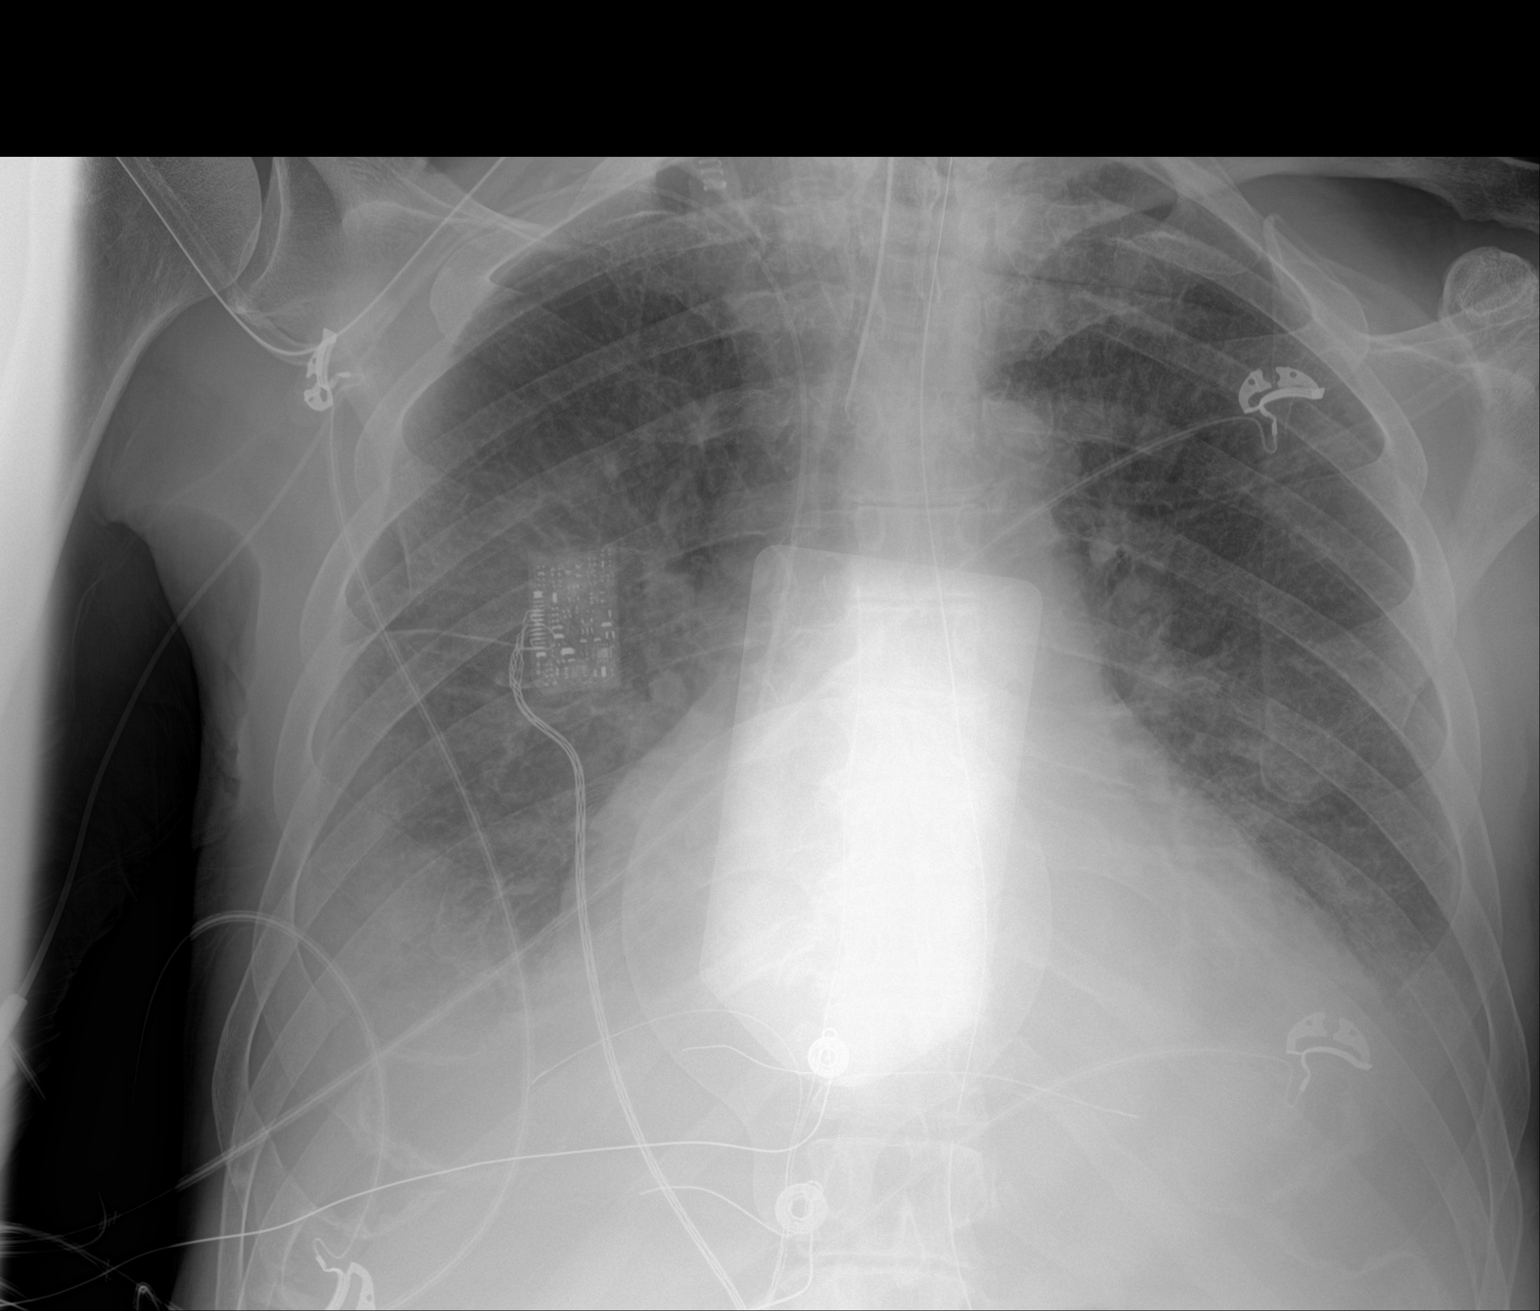

[1 of 1 positions shown; findings below may reference images not displayed]

FINDINGS: Endotracheal tube tip 3.8 cm from the carina. Tip and side port of
the enteric tube below the diaphragm in the stomach. Right upper
extremity PICC tip in the distal SVC. Cardiomegaly with slight
progression. Slight increase in bilateral pleural effusions and
bibasilar airspace disease. Increased pulmonary edema. No visualized
pneumothorax. No acute osseous abnormalities.
IMPRESSION: 1. Endotracheal tube tip 3.8 cm from the carina. Enteric tube and
right upper extremity PICC in place.
2. Increased bilateral pleural effusions and bibasilar airspace
disease/atelectasis. Increased pulmonary edema.
